# Patient Record
Sex: Male | Born: 1945
Health system: Southern US, Community
[De-identification: ages and names within clinical notes are randomized; demographics above are authoritative.]

## PROBLEM LIST (undated history)

## (undated) DIAGNOSIS — I4891 Unspecified atrial fibrillation: Secondary | ICD-10-CM

## (undated) DIAGNOSIS — I10 Essential (primary) hypertension: Secondary | ICD-10-CM

## (undated) DIAGNOSIS — Z923 Personal history of irradiation: Secondary | ICD-10-CM

## (undated) DIAGNOSIS — E119 Type 2 diabetes mellitus without complications: Secondary | ICD-10-CM

## (undated) DIAGNOSIS — C61 Malignant neoplasm of prostate: Secondary | ICD-10-CM

## (undated) DIAGNOSIS — J309 Allergic rhinitis, unspecified: Secondary | ICD-10-CM

## (undated) DIAGNOSIS — E785 Hyperlipidemia, unspecified: Secondary | ICD-10-CM

## (undated) HISTORY — DX: Hyperlipidemia, unspecified: E78.5

## (undated) HISTORY — DX: Malignant neoplasm of prostate: C61

## (undated) HISTORY — PX: KNEE SURGERY: SHX244

## (undated) HISTORY — DX: Essential (primary) hypertension: I10

## (undated) HISTORY — DX: Allergic rhinitis, unspecified: J30.9

## (undated) HISTORY — DX: Type 2 diabetes mellitus without complications: E11.9

## (undated) HISTORY — PX: SKIN GRAFT: SHX250

## (undated) HISTORY — PX: TONSILECTOMY/ADENOIDECTOMY WITH MYRINGOTOMY: SHX6125

---

## 2006-12-15 HISTORY — PX: PROSTATECTOMY: SHX69

## 2017-03-29 ENCOUNTER — Telehealth: Payer: Self-pay | Admitting: Cardiology

## 2017-03-29 NOTE — Telephone Encounter (Signed)
Closed Encounter  °

## 2017-04-30 ENCOUNTER — Ambulatory Visit: Payer: Self-pay | Admitting: Cardiology

## 2017-05-13 ENCOUNTER — Encounter: Payer: Self-pay | Admitting: Cardiology

## 2017-05-13 DIAGNOSIS — I480 Paroxysmal atrial fibrillation: Secondary | ICD-10-CM | POA: Insufficient documentation

## 2017-05-14 ENCOUNTER — Ambulatory Visit (INDEPENDENT_AMBULATORY_CARE_PROVIDER_SITE_OTHER): Payer: Medicare Other | Admitting: Cardiology

## 2017-05-14 ENCOUNTER — Encounter: Payer: Self-pay | Admitting: Cardiology

## 2017-05-14 VITALS — BP 120/66 | HR 93 | Ht 68.0 in | Wt 220.0 lb

## 2017-05-14 DIAGNOSIS — I4892 Unspecified atrial flutter: Secondary | ICD-10-CM | POA: Insufficient documentation

## 2017-05-14 DIAGNOSIS — I480 Paroxysmal atrial fibrillation: Secondary | ICD-10-CM

## 2017-05-14 DIAGNOSIS — I10 Essential (primary) hypertension: Secondary | ICD-10-CM | POA: Insufficient documentation

## 2017-05-14 DIAGNOSIS — Z7901 Long term (current) use of anticoagulants: Secondary | ICD-10-CM | POA: Diagnosis not present

## 2017-05-14 NOTE — Patient Instructions (Addendum)
Medication Instructions:  Your physician recommends that you continue on your current medications as directed. Please refer to the Current Medication list given to you today.   Labwork: None  Testing/Procedures: You had an EKG today.  Follow-Up: Your physician recommends that you schedule a follow-up appointment in: 3 months.   Any Other Special Instructions Will Be Listed Below (If Applicable).     If you need a refill on your cardiac medications before your next appointment, please call your pharmacy.    1. Avoid all over-the-counter antihistamines except Claritin/Loratadine and Zyrtec/Cetrizine. 2. Avoid all combination including cold sinus allergies flu decongestant and sleep medications 3. You can use Robitussin DM Mucinex and Mucinex DM for cough. 4. can use Tylenol aspirin ibuprofen and naproxen but no combinations such as sleep or sinus.  KardiaMobile [email or web address]        FDA-cleared, clinical grade mobile EKG monitor: Jodelle Red is the most clinically-validated mobile EKG used by the world's leading cardiac care medical professionals With Basic service, know instantly if your heart rhythm is normal or if atrial fibrillation is detected, and email the last single EKG recording to yourself or your doctor Premium service, available for purchase through the Kardia app for $9.99 per month or $99 per year, includes unlimited history and storage of your EKG recordings, a monthly EKG summary report to share with your doctor, along with the ability to track your blood pressure, activity and weight Includes one KardiaMobile phone clip FREE SHIPPING: Standard delivery 1-3 business days. Orders placed by 11:00am PST will ship that afternoon. Otherwise, will ship next business day. All orders ship via [employer] from [place], [place]

## 2017-05-14 NOTE — Progress Notes (Signed)
Cardiology Office Note:    Date:  05/14/2017   ID:  Edgar Herrera, DOB January 02, 1946, MRN [id number]  [facility]  Cardiologist:  Shirlee More, MD   Referring MD: No ref. provider found  ASSESSMENT:    1. Paroxysmal atrial fibrillation (HCC)   2. Chronic anticoagulation   3. Essential hypertension    PLAN:    In order of problems listed above   1. Clinically has done well he said no recurrence and at this time I would not place him on antiarrhythmic drug. He is records says that he had bradycardia and they did not use a beta blocker but he tells me his home heart rates run 60-90 bpm. I asked him to continue his anticoagulant to survey his heart rate and blood pressure with blood pressure monitor or to purchase the eye phone adapter record EKGs. If having frequent episodes of what started antiarrhythmic drug and low-dose beta blocker. In particular I would not referral for EP catheter ablation at this time. 2. Stable appropriate with his chads 2 score continue his anticoagulant 3. Stable blood pressure target continue his ACE inhibitor  Next appointment in 3 months   Medication Adjustments/Labs and Tests Ordered: Current medicines are reviewed at length with the patient today.  Concerns regarding medicines are outlined above.  Orders Placed This Encounter  Procedures  . EKG 12-Lead   No orders of the defined types were placed in this encounter.    Chief Complaint  Patient presents with  . Atrial Fibrillation    History of Present Illness:    Edgar Herrera is a 71 y.o. male who is being seen today for the evaluation of atrial  fibrillation / flutter at the patient  Request. Prior to the visit I reviewed his [place] hospital records including primary care cardiology consultation a recent cardiology note. November of last year he felt badly respiratory infection and was found to be in atrial fibrillation. He subsequently converted to sinus rhythm and since then he's had one  brief episode of rapid heart rhythm lasting a few minutes. He's been anticoagulated and has not been on any suppressive beta blocker or antiarrhythmic drug treatment. He remains active exercises on a regular basis and has had no angina dyspnea palpitation syncope or TIA. He's had no bleeding complication from his anticoagulant. Echocardiogram was reassuring and stress test was done several years ago that was normal. He seeks my opinion regarding further treatment with either an antiarrhythmic drug referral to EP for catheter ablation. He has no history of congenital rheumatic or thyroid disease   Past Medical History:  Diagnosis Date  . Allergic rhinitis   . Arrhythmia   . Hyperlipidemia   . Hypertension   . Prostate cancer [facility])     Past Surgical History:  Procedure Laterality Date  . KNEE SURGERY Bilateral   . PROSTATECTOMY    . SKIN GRAFT      Current Medications: Current Meds  Medication Sig  . allopurinol (ZYLOPRIM) 100 MG tablet Take 100 mg by mouth daily.  Marland Kitchen amLODipine (NORVASC) 10 MG tablet Take 10 mg by mouth daily.  Marland Kitchen apixaban (ELIQUIS) 5 MG TABS tablet Take 5 mg by mouth 2 (two) times daily.  . Artificial Tear Solution (SYSTANE CONTACTS) SOLN Apply 1 drop to eye daily.  . carbamide peroxide (DEBROX) 6.5 % OTIC solution 4 drops 4 (four) times daily.  . cyanocobalamin 500 MCG tablet Take 500 mcg by mouth 2 (two) times daily.  Marland Kitchen docusate  sodium (COLACE) 100 MG capsule Take 100 mg by mouth daily.  Marland Kitchen glipiZIDE (GLUCOTROL) 5 MG tablet Take 5 mg by mouth daily before breakfast. Take 1 tablet before breakfast and Take 1/5 tablet before dinner.  . hydrochlorothiazide (HYDRODIURIL) 25 MG tablet Take 12.5 mg by mouth daily.  Marland Kitchen lisinopril (PRINIVIL,ZESTRIL) 40 MG tablet Take 40 mg by mouth daily.  . metFORMIN (GLUCOPHAGE) 1000 MG tablet Take 1,000 mg by mouth 2 (two) times daily with a meal.  . Multiple Vitamins-Minerals (MULTIVITAMIN WITH MINERALS) tablet Take 1 tablet by mouth  daily.  . NON FORMULARY Place 0.5 application into both eyes at bedtime. Ocular Lubricant ointment 3.5 gm  . Omega-3 Fatty Acids (FISH OIL) 1000 MG CAPS Take 1,000 mg by mouth 2 (two) times daily.  . pravastatin (PRAVACHOL) 40 MG tablet Take 40 mg by mouth daily.     Allergies:   Patient has no known allergies.   Social History   Social History  . Marital status: Married    Spouse name: N/A  . Number of children: N/A  . Years of education: N/A   Social History Main Topics  . Smoking status: Never Smoker  . Smokeless tobacco: Never Used  . Alcohol use No  . Drug use: No  . Sexual activity: Not Asked   Other Topics Concern  . None   Social History Narrative  . None     Family History: The patient's family history includes Heart attack in his father; Hypertension in his brother, sister, and sister; Lung cancer in his mother.  ROS:   ROS Please see the history of present illness.     All other systems reviewed and are negative.  EKGs/Labs/Other Studies Reviewed:    The following studies [facility]  EKG:  EKG is  ordered today.  The ekg ordered today demonstrates [place], insignificant Q inferiorly, normal EKG. Echo 09/06/16, mild LVH, EF >55%, mild MR and TR Recent Labs: 03/22/13 CBC and CMP normal No results found for requested labs within last 8760 hours.  Recent Lipid Panel 03/22/17 Chol 144, HDL 43, LDL 80 No results found for: CHOL, TRIG, HDL, CHOLHDL, VLDL, LDLCALC, LDLDIRECT  Physical Exam:    VS:  BP 120/66   Pulse 93   Ht 5\' 8"  (1.727 m)   Wt 220 lb (99.8 kg)   SpO2 96%   BMI 33.45 kg/m     Wt Readings from Last 3 Encounters:  05/14/17 220 lb (99.8 kg)     GEN:  Well nourished, well developed in no acute distress HEENT: Normal NECK: No JVD; No carotid bruits LYMPHATICS: No lymphadenopathy CARDIAC: RRR, no murmurs, rubs, gallops RESPIRATORY:  Clear to auscultation without rales, wheezing or rhonchi    ABDOMEN: Soft, non-tender, non-distended MUSCULOSKELETAL:  No edema; No deformity  SKIN: Warm and dry NEUROLOGIC:  Alert and oriented x 3 PSYCHIATRIC:  Normal affect     Signed, Shirlee More, MD  05/14/2017 2:16 PM    [facility]

## 2017-06-07 ENCOUNTER — Ambulatory Visit: Payer: Medicare Other | Admitting: Sports Medicine

## 2017-09-13 ENCOUNTER — Ambulatory Visit: Payer: Medicare Other | Admitting: Cardiology

## 2017-12-21 ENCOUNTER — Telehealth: Payer: Self-pay | Admitting: *Deleted

## 2017-12-21 ENCOUNTER — Encounter: Payer: Self-pay | Admitting: Radiation Oncology

## 2017-12-22 NOTE — Telephone Encounter (Signed)
Oncology Nurse Navigator Documentation  Placed introductory call to new referral patient Edgar Herrera.  Introduced myself as the H&N oncology nurse navigator that works with Dr. Isidore Moos to whom he has been referred by Dr. Silvestre Moment.     He confirmed understanding of referral and appt date/time of 3/22 7:30 NE/8:00 consult Dr. Isidore Moos.  I briefly explained my role as his navigator, indicated I would be joining him during his appt next week.  I confirmed his understanding of [place] location, explained arrival and RadOnc registration process for appt.  I provided my contact information, encouraged him to call with questions/concerns before next week.  He reported:  Has copy of 2/21 CT Neck, will bring to consult.  Has PET scheduled 3/18 in [place].  He will bring copy to consult.  Has appt with Dr. Conley Canal, [facility], 3/20.  I encouraged him to take copy of PET and CT Neck.  I encouraged him to call me with questions/concerns prior to next week's appt.  Gayleen Orem, RN, BSN Head & Neck Oncology Nurse [place] at [place] [phone]

## 2017-12-22 NOTE — Telephone Encounter (Signed)
Oncology Nurse Navigator Documentation  Spoke w/ Chiquita Loth, [place] [phone], x [phone]) re patient's biopsy.  She indicated bx taken yesterday, 3/14, report pending.  I requested notification when available including fax of copy to [place], she agreed.  I provided my phone #, [place] fax #.  Gayleen Orem, RN, BSN Head & Neck Oncology Nurse [place] at [place] [phone]

## 2017-12-25 ENCOUNTER — Ambulatory Visit
Admission: RE | Admit: 2017-12-25 | Discharge: 2017-12-25 | Disposition: A | Discharge status: Home / Self Care | Payer: Self-pay | Source: Ambulatory Visit | Attending: Radiation Oncology | Admitting: Radiation Oncology

## 2017-12-25 ENCOUNTER — Other Ambulatory Visit: Payer: Self-pay | Admitting: Radiation Oncology

## 2017-12-25 DIAGNOSIS — C029 Malignant neoplasm of tongue, unspecified: Secondary | ICD-10-CM

## 2017-12-26 NOTE — Progress Notes (Signed)
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   Patient presented with symptoms of: He presented to Dr. Silvestre Moment ([facility]) on 12/10/17 for urgent appointment after recent ER visit. He had an episode of spitting up blood which led to the ER visit. He did report mild dysphagia for about 2 weeks.    Biopsies revealed:  Specimen received 12/21/17 from Silvestre Moment MD revealed: Base of tongue mass, biopsy: Invasive poorly differentiated squamous cell carcinoma.  Comment: The biopsy material shows invasive poorly differentiated non-small cell carcinoma. The tumor is diffusely positive for p 40 and p16.   Nutrition Status Yes No Comments  Weight changes? [x]  []  He reports losing about 8 lbs over the past few months.   Swallowing concerns? [x]  []  He is eating softer foods. He reports some foods and pills are difficult to get past his throat  PEG? []  [x]     Referrals Yes No Comments  Social Work? []  [x]    Dentistry? []  [x]    Swallowing therapy? []  [x]    Nutrition? []  [x]    Med/Onc? [x]  []  Dr. Jimmye Norman at the [facility] Yes No Comments  Prior radiation? []  [x]    Pacemaker/ICD? []  [x]    Possible current pregnancy? []  [x]    Is the patient on methotrexate? []  [x]     Tobacco/Marijuana/Snuff/ETOH use: He has never smoked and does not drink alcohol   Past/Anticipated interventions by otolaryngology, if any:  Sera Edison Nasuti performed trans-nasal fiberoptic laryngoscopy 12/17/17  Past/Anticipated interventions by medical oncology, if any:  None scheduled at this time.    Current Complaints / other details:   11/29/17 CT soft tissue neck  Dr. Conley Canal at [facility] on 12/26/17 ? TORS procedure. He does not qualify. They saw Dr. Jimmye Norman, medical oncologist at [facility]. Per his wife's report they will be referred here to see a medical oncologist.   BP 123/73   Pulse 74   Temp 98.5 F (36.9 C)   Ht 5\' 8"  (1.727 m)   Wt  213 lb (96.6 kg)   SpO2 95% Comment: room air  BMI 32.39 kg/m    Wt Readings from Last 3 Encounters:  12/28/17 213 lb (96.6 kg)  05/14/17 220 lb (99.8 kg)

## 2017-12-27 ENCOUNTER — Telehealth: Payer: Self-pay | Admitting: *Deleted

## 2017-12-27 NOTE — Telephone Encounter (Signed)
Oncology Nurse Navigator Documentation  [facility] [place] re pt's dental hx, requested call-back.  Gayleen Orem, RN, BSN Head & Neck Oncology Nurse [place] at [place] [phone]

## 2017-12-28 ENCOUNTER — Encounter: Payer: Self-pay | Admitting: *Deleted

## 2017-12-28 ENCOUNTER — Encounter: Payer: Self-pay | Admitting: Radiation Oncology

## 2017-12-28 ENCOUNTER — Ambulatory Visit
Admission: RE | Admit: 2017-12-28 | Discharge: 2017-12-28 | Disposition: A | Discharge status: Home / Self Care | Payer: No Typology Code available for payment source | Source: Ambulatory Visit | Attending: Radiation Oncology | Admitting: Radiation Oncology

## 2017-12-28 ENCOUNTER — Other Ambulatory Visit: Payer: Self-pay

## 2017-12-28 ENCOUNTER — Telehealth: Payer: Self-pay | Admitting: *Deleted

## 2017-12-28 VITALS — BP 123/73 | HR 74 | Temp 98.5°F | Ht 68.0 in | Wt 213.0 lb

## 2017-12-28 DIAGNOSIS — Z7401 Bed confinement status: Secondary | ICD-10-CM | POA: Insufficient documentation

## 2017-12-28 DIAGNOSIS — Z7901 Long term (current) use of anticoagulants: Secondary | ICD-10-CM | POA: Insufficient documentation

## 2017-12-28 DIAGNOSIS — E785 Hyperlipidemia, unspecified: Secondary | ICD-10-CM | POA: Insufficient documentation

## 2017-12-28 DIAGNOSIS — I4891 Unspecified atrial fibrillation: Secondary | ICD-10-CM | POA: Diagnosis not present

## 2017-12-28 DIAGNOSIS — C01 Malignant neoplasm of base of tongue: Secondary | ICD-10-CM | POA: Diagnosis present

## 2017-12-28 DIAGNOSIS — I10 Essential (primary) hypertension: Secondary | ICD-10-CM | POA: Insufficient documentation

## 2017-12-28 DIAGNOSIS — Z7984 Long term (current) use of oral hypoglycemic drugs: Secondary | ICD-10-CM | POA: Diagnosis not present

## 2017-12-28 DIAGNOSIS — Z79899 Other long term (current) drug therapy: Secondary | ICD-10-CM | POA: Insufficient documentation

## 2017-12-28 DIAGNOSIS — Z8546 Personal history of malignant neoplasm of prostate: Secondary | ICD-10-CM | POA: Diagnosis not present

## 2017-12-28 HISTORY — DX: Unspecified atrial fibrillation: I48.91

## 2017-12-28 MED ORDER — LARYNGOSCOPY SOLUTION RAD-ONC
15.0000 mL | Freq: Once | TOPICAL | Status: AC
  Administered 2017-12-28: 15 mL via TOPICAL
  Filled 2017-12-28: qty 15

## 2017-12-28 NOTE — Progress Notes (Signed)
Radiation Oncology         [phone] ________________________________  Initial Outpatient Consultation  Name: Edgar Herrera MRN: [id number]  Date: 12/28/2017  DOB: 30-Jul-1946  [facility]  Berneice Heinrich, MD   REFERRING PHYSICIAN: Berneice Heinrich, MD  DIAGNOSIS:    ICD-10-CM   1. 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 male who presented to an emergency department in [place] on 11/29/2017 following an episode of hemoptysis and a 2-week history of intermittent mild dysphagia. He was evaluated with a CT scan of the neck that showed a large exophytic mass extending from the hyoid Ege medially toward the midline and laterally up toward the glossotonsillar sulcus.  Subsequently, the patient saw Dr. Silvestre Moment for urgent consultation at [facility] on 12/10/2017. A trans-nasal fiberoptic laryngoscopy was performed on 12/17/2017.  Biopsy of tongue base mass on 12/17/2017 revealed: Invasive poorly differentiated squamous cell carcinoma. The biopsy material showed invasive poorly differentiated non-small cell carcinoma. The tumor is diffusely positive for p40 and p16.   Pertinent imaging thus far includes PET - I've reviewed those images but there is not an available report - notable for left BOT large mass, ipsilateral adenopathy, but revealing no evidence of involvement outside the head and neck.     He was seen in medical oncology by Dr. Jimmye Norman at the [facility]. He was also seen by Dr. Conley Canal at [facility] on 12/26/2017 for  consideration of the TORS procedure. The patient is not a TORS candidate according to Dr. Conley Canal. He staged pt's cancer clinically at [facility], though that may be by AJCC 7th edition.  He has been referred today for discussion of potential radiation treatment options. He is accompanied by his wife. We are also joined in consultation by Gayleen Orem, RN, our Head and Neck Navigator. He reports he would like to be referred to see a medical oncologist here at [facility].  Swallowing issues, if any: He reports some foods and pills are difficult to get past his throat. He is eating softer foods.  Weight Changes: He reports an 8 pound weight loss over the past few months.  Pain status: He denies.   Tobacco history, if any: He denies.  ETOH abuse, if any: He denies.  Prior cancers, if any: Skin cancers that have been "burnt off or shaved off"  On review of systems, the patient reports diabetes.  PREVIOUS RADIATION THERAPY: No  PAST MEDICAL HISTORY:  has a past medical history of Allergic rhinitis, Atrial fibrillation ([place]), Hyperlipidemia, Hypertension, and Prostate cancer ([place]).    PAST SURGICAL HISTORY: Past Surgical History:  Procedure Laterality Date  . KNEE SURGERY Bilateral    2007 and 1998,   . PROSTATECTOMY  12/15/2006  . SKIN GRAFT     as a child left elbow  . TONSILECTOMY/ADENOIDECTOMY WITH MYRINGOTOMY     as a child    FAMILY HISTORY: family history includes Heart attack in his father; Hypertension in his brother, sister, and sister; Lung cancer  in his mother.  SOCIAL HISTORY:  reports that he has never smoked. He has never used smokeless tobacco. He reports that he drinks alcohol. He reports that he does not use drugs.  ALLERGIES: Patient has no known allergies.  MEDICATIONS:  Current Outpatient Medications  Medication Sig Dispense Refill  . allopurinol (ZYLOPRIM) 100 MG tablet Take 100 mg by mouth daily.    Marland Kitchen amLODipine (NORVASC) 10 MG tablet Take 10 mg  by mouth daily.    Marland Kitchen apixaban (ELIQUIS) 5 MG TABS tablet Take 5 mg by mouth 2 (two) times daily.    . Artificial Tear Solution (SYSTANE CONTACTS) SOLN Apply 1 drop to eye daily.    . carbamide peroxide (DEBROX) 6.5 % OTIC solution 4 drops 4 (four) times daily.    . cyanocobalamin 500 MCG tablet Take 500 mcg by mouth 2 (two) times daily.    Marland Kitchen glipiZIDE (GLUCOTROL) 5 MG tablet Take 5 mg by mouth daily before breakfast. Take 1 tablet before breakfast and Take 1/5 tablet before dinner.    . hydrochlorothiazide (HYDRODIURIL) 25 MG tablet Take 12.5 mg by mouth daily.    Marland Kitchen lisinopril (PRINIVIL,ZESTRIL) 40 MG tablet Take 40 mg by mouth daily.    . metFORMIN (GLUCOPHAGE) 1000 MG tablet Take 1,000 mg by mouth 2 (two) times daily with a meal.    . NON FORMULARY Place 0.5 application into both eyes at bedtime. Ocular Lubricant ointment 3.5 gm    . Omega-3 Fatty Acids (FISH OIL) 1000 MG CAPS Take 1,000 mg by mouth 2 (two) times daily.    . pravastatin (PRAVACHOL) 40 MG tablet Take 40 mg by mouth daily.    . Multiple Vitamins-Minerals (MULTIVITAMIN WITH MINERALS) tablet Take 1 tablet by mouth daily.     Current Facility-Administered Medications  Medication Dose Route Frequency Provider Last Rate Last Dose  . laryngocopy solution for Rad-Onc  15 mL Topical Once [name], MD        REVIEW OF SYSTEMS:  A 10+ POINT REVIEW OF SYSTEMS WAS OBTAINED including neurology, dermatology, psychiatry, cardiac, respiratory, lymph, extremities, GI, GU, Musculoskeletal, constitutional, breasts, reproductive, HEENT.  All pertinent positives are noted in the HPI.  All others are negative.   PHYSICAL EXAM:  height is 5\' 8"  (1.727 m) and weight is 213 lb (96.6 kg). His temperature is 98.5 F (36.9 C). His blood pressure is 123/73 and his pulse is 74. His oxygen saturation is 95%.   General: Alert and oriented, in no acute distress. HEENT: Head is normocephalic. Extraocular movements are intact. He has metal on his teeth.  He is missing some of his teeth. I was not able to appreciate a tumor in the mouth or upper oropharnx. Neck: Neck is notable for two palpable masses that extend from the angle of left mandible to the submandibular region. Each mass is approximately 1.5 to 2 cm in size. Heart: Regular in rate and rhythm with no murmurs, rubs, or gallops. Chest: Clear to auscultation bilaterally, with no rhonchi, wheezes, or rales. Abdomen: Soft, nontender, nondistended, with no rigidity or guarding. Extremities: He has some pitting edema in his ankles bilaterally. Lymphatics: see Neck Exam Skin: Evidence of prior sun damage. Musculoskeletal: Symmetric strength and muscle tone throughout. Neurologic: Cranial nerves II through XII are grossly intact. No obvious focalities. Speech is fluent. Coordination is intact. Finger to nose testing intact. Psychiatric: Judgment and insight are intact. Affect is appropriate.  PROCEDURE NOTE: After obtaining consent and anesthetizing the nasal cavity with topical lidocaine and  phenylephrine, the flexible endoscope was introduced and passed through the nasal cavity.  He has a multilobular left base of tongue mass which appears to be originating from the left and involving the left lateral pharyngeal wall. It crosses midline and touches, but does not appear to be attached to, the right lateral pharyngeal wall. Due to the bulk of the mass, I am not able to pass the scope to visualize his larynx.    ECOG = 1  0 - Asymptomatic (Fully active, able to carry on all predisease activities without restriction)  1 - Symptomatic but completely ambulatory (Restricted in physically strenuous activity but ambulatory and able to carry out work of a light or sedentary nature. For example, light housework, office work)  2 - Symptomatic, <50% in bed during the day (Ambulatory and capable of all self care but unable to carry out any work activities. Up and about more than 50% of waking hours)  3 -  Symptomatic, >50% in bed, but not bedbound (Capable of only limited self-care, confined to bed or chair 50% or more of waking hours)  4 - Bedbound (Completely disabled. Cannot carry on any self-care. Totally confined to bed or chair)  5 - Death   Eustace Pen MM, Creech RH, Tormey DC, et al. [phone]). "Toxicity and response criteria of the [facility] Group". [place] Oncol. 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 I do recommend radiotherapy for this patient. Anticipate 7 weeks of RT to throat/ b/l neck and concurrent systemic therapy.  We discussed the potential risks, benefits, and side effects of radiotherapy. We talked in detail about acute and late effects. We discussed that some of the most bothersome acute effects may be mucositis, dysgeusia, salivary changes, skin irritation, hair loss, dehydration, weight loss and fatigue. We talked about late effects which include but are not necessarily limited to dysphagia, hypothyroidism, nerve injury, spinal cord injury, xerostomia, trismus, and neck edema. No guarantees of treatment were given. A consent form was signed and placed in the patient's medical record. The patient is enthusiastic about proceeding with treatment. I look forward to participating in the patient's care.    Simulation (treatment planning) will take place after clearance from dental medicine  We also discussed that the treatment of head and neck cancer is a multidisciplinary process to maximize treatment outcomes and quality of life. For this reasons the following  referrals have been or will be made:  1. Medical oncology to discuss chemotherapy. He will be referred to see a medical oncologist here at [facility].  2. Dentistry for dental evaluation, possible extractions in the radiation fields, scatter guards, and /or advice on reducing risk of cavities, osteoradionecrosis, or other oral issues.  3. Nutritionist for nutrition support during and after treatment.  4. Speech language pathology for swallowing and/or speech therapy.  5. Social work for social support.   6. Physical therapy due to risk of lymphedema in neck and deconditioning.  7. Baseline labs    __________________________________________   [name], MD  This  document serves as a record of services personally performed by [name], MD. It was created on her behalf by Rae Lips, a trained medical scribe. The creation of this record is based on the scribe's personal observations and the provider's statements to them. This document has been checked and approved by the attending provider.

## 2017-12-29 NOTE — Progress Notes (Signed)
Oncology Nurse Navigator Documentation Oncology Nurse Navigator Documentation  Met with patient during initial consult with Dr. Isidore Moos.  He was accompanied by his wife.  1. Further introduced myself as their Navigator, explained my role as a member of the Care Team.   2. Provided New Patient Information packet, discussed contents:  Contact information for physician(s), myself, other members of the Care Team.  Advance Directive information ([place] blue pamphlet with LCSW contact info), provided [facility] AD booklet.  Fall Prevention Patient Safety Plan  Appointment Guideline  Financial Assistance Information sheet  [place] with highlight of [place]  SLP information sheet 3. Provided introductory explanation of radiation treatment including SIM planning and purpose of Aquaplast head and shoulder mask, showed them example.   4. Provided and discussed education handouts for PEG and PAC.  5. Discussed the need for pre-radiotherapy dental evaluation.  Wife noted [facility] covers dental services only for vets considered 100% disabled; he is deemed 70% disabled. 6. We discussed his attendance at the 4/9 [facility]. 7. Of Note:  Met with Dr. Conley Canal, [facility] Wed.  He indicated surgery not a viable tmt option.  Had appt with [facility] yesterday, was told he can have chemo at [facility], pre-authorization pending.  They understand I will coordinate ASAP [facility] MedOnc consult appt.  [facility] does not authorize services in [place] where they live.  They voiced comfort/acceptance with 35-45 minute travel to [facility]. 8.  They voiced understanding I will contact [facility] re pre-authorization for PT, dental, PAC/PEG and check on chemo pre-auth. 9.  I encouraged them to call me with questions/concerns as procedures/treatments proceed.  They verbalized understanding of information provided.    Gayleen Orem, RN, BSN Head & Neck Oncology Nurse [place] at [place] [phone]

## 2017-12-29 NOTE — Telephone Encounter (Addendum)
Oncology Nurse Navigator Documentation  Returned call to [place].  She informed pre-authorization for PEG has been completed, to be faxed this afternoon.  I provided RadOnc fax #.  I informed her pt met with Dr. Isidore Moos, RadOnc, this morning.  Per this appt, pre-authorization needed for:  PAC, PT lymphedema, pre-radiotherapy dental.  She noted dental likely not to be covered but she will request.    I shared pt's indication chemotherapy will be pre-authorized for [facility].  She noted not issued yet but will check on status.  She requested faxed request for above noted services, provided fax [phone].  I relayed request to to [place].  Gayleen Orem, RN, BSN Head & Neck Oncology Nurse [place] at [place] [phone]

## 2017-12-31 ENCOUNTER — Other Ambulatory Visit: Payer: Self-pay | Admitting: Radiation Oncology

## 2017-12-31 DIAGNOSIS — C01 Malignant neoplasm of base of tongue: Secondary | ICD-10-CM

## 2018-01-01 ENCOUNTER — Telehealth: Payer: Self-pay

## 2018-01-01 ENCOUNTER — Encounter: Payer: Self-pay | Admitting: Radiation Oncology

## 2018-01-01 ENCOUNTER — Telehealth: Payer: Self-pay | Admitting: Radiation Oncology

## 2018-01-01 ENCOUNTER — Encounter (HOSPITAL_COMMUNITY): Payer: Self-pay | Admitting: Dentistry

## 2018-01-01 ENCOUNTER — Ambulatory Visit (HOSPITAL_COMMUNITY): Payer: Self-pay | Admitting: Dentistry

## 2018-01-01 ENCOUNTER — Other Ambulatory Visit (HOSPITAL_COMMUNITY): Payer: No Typology Code available for payment source | Admitting: Dentistry

## 2018-01-01 VITALS — BP 138/66 | HR 69 | Temp 98.4°F

## 2018-01-01 DIAGNOSIS — K0530 Chronic periodontitis, unspecified: Secondary | ICD-10-CM

## 2018-01-01 DIAGNOSIS — C01 Malignant neoplasm of base of tongue: Secondary | ICD-10-CM

## 2018-01-01 DIAGNOSIS — M264 Malocclusion, unspecified: Secondary | ICD-10-CM

## 2018-01-01 DIAGNOSIS — K036 Deposits [accretions] on teeth: Secondary | ICD-10-CM

## 2018-01-01 DIAGNOSIS — Z01818 Encounter for other preprocedural examination: Secondary | ICD-10-CM

## 2018-01-01 DIAGNOSIS — K030 Excessive attrition of teeth: Secondary | ICD-10-CM

## 2018-01-01 DIAGNOSIS — Z9189 Other specified personal risk factors, not elsewhere classified: Secondary | ICD-10-CM

## 2018-01-01 DIAGNOSIS — K06010 Localized gingival recession, unspecified: Secondary | ICD-10-CM

## 2018-01-01 DIAGNOSIS — J3489 Other specified disorders of nose and nasal sinuses: Secondary | ICD-10-CM

## 2018-01-01 DIAGNOSIS — K08409 Partial loss of teeth, unspecified cause, unspecified class: Secondary | ICD-10-CM

## 2018-01-01 MED ORDER — SODIUM FLUORIDE 1.1 % DT GEL: DENTAL | 99 refills | Status: AC

## 2018-01-01 MED FILL — FLUORISHIELD 1.1% GEL: 1.1 % | 30 days supply | Qty: 114 | Fill #0

## 2018-01-01 NOTE — Telephone Encounter (Signed)
Edgar Herrera is scheduled for CT simulation with IV contrast tomorrow. I have asked him to come at 11:45 am to have labs drawn before the CT simulation tomorrow. I have also instructed him not to take his metformin starting tomorrow 3/27 and wait to begin taking it again until after post-simulation labs have been drawn on Friday 3/29 and he has received a phone call from this office saying it is ok to take his metformin. He voiced his understanding, and knows to call me if he has any further questions or concerns.

## 2018-01-01 NOTE — Progress Notes (Signed)
Has armband been applied? Yes  Does patient have an allergy to IV contrast dye?: No   Has patient ever received premedication for IV contrast dye?: N/A  Does patient take metformin?: Yes  If patient does take metformin when was the last dose: 01/01/18   Date of lab work: 01/02/18 BUN: 19 CR: 0.97 EGFR: >60  IV site: Right AC  Has IV site been added to flowsheet?  Yes  BP (!) 120/58 (BP Location: Right Arm)   Pulse 66   Temp 98.2 F (36.8 C)   Ht 5' 8"  (1.727 m)   Wt 214 lb 6.4 oz (97.3 kg)   SpO2 98% Comment: room air  BMI 32.60 kg/m

## 2018-01-01 NOTE — Patient Instructions (Signed)
RADIATION THERAPY AND DECISIONS REGARDING YOUR TEETH  Xerostomia (dry mouth) Your salivary glands may be in the filed of radiation.  Radiation may include all or part of your saliva glands.  This will cause your saliva to dry up and you will have a dry mouth.  The dry mouth will be for the rest of your life unless your radiation oncologist tells you otherwise.  Your saliva has many functions:  Saliva wets your tongue for speaking.  It coats your teeth and the inside of your mouth for easier movement.  It helps with chewing and swallowing food.  It helps clean away harmful acid and toxic products made by the germs in your mouth, therefore it helps prevent cavities.  It kills some germs in your mouth and helps to prevent gum disease.  It helps to carry flavor to your taste buds.  Once you have lost your saliva you will be at higher risk for tooth decay and gum disease.  What can be done to help improve your mouth when there's not enough saliva:  1.  Your dentist may give a prescription for Salagen.  It will not bring back all of your saliva but may bring back some of it.  Also your saliva may be thick and ropy or white and foamy. It will not feel like it use to feel.  2.  You will need to swish with water every time your mouth feels dry.  YOU CANNOT suck on any cough drops, mints, lemon drops, candy, vitamin C or any other products.  You cannot use anything other than water to make your mouth feel less dry.  If you want to drink anything else you have to drink it all at once and brush afterwards.  Be sure to discuss the details of your diet habits with your dentist or hygienist.  Radiation caries: This is decay that happens very quickly once your mouth is very dry due to radiation therapy.  Normally cavities take six months to two years to become a problem.  When you have dry mouth cavities may take as little as eight weeks to cause you a problem.  This is why dental check ups every two  months are necessary as long as you have a dry mouth. Radiation caries typically, but not always, start at your gum line where it is hard to see the cavity.  It is therefore also hard to fill these cavities adequately.  This high rate of cavities happens because your mouth no longer has saliva and therefore the acid made by the germs starts the decay process.  Whenever you eat anything the germs in your mouth change the food into acid.  The acid then burns a small hole in your tooth.  This small hole is the beginning of a cavity.  If this is not treated then it will grow bigger and become a cavity.  The way to avoid this hole getting bigger is to use fluoride every evening as prescribed by your dentist.  You have to make sure that your teeth are very clean before you use the fluoride.  This fluoride in turn will strengthen your teeth and prepare them for another day of fighting acid.  If you develop radiation caries many times the damage is so large that you will have to have all your teeth removed.  This could be a big problem if some of these teeth are in the field of radiation.  Further details of why this could be   a big problem will follow.  (See Osteoradionecrosis).  Loss of taste (dysgeusia) This happens to varying degrees once you've had radiation therapy to your jaw region.  Many times taste is not completely lost but becomes limited.  The loss of taste is mostly due to radiation affecting your taste buds.  However if you have no saliva in your mouth to carry the flavor to your taste buds it would be difficult for your taste buds to taste anything.  That is why using water or a prescription for Salagen prior to meals and during meals may help with some of the taste.  Keep in mind that taste generally returns very slowly over the course of several months or several years after radiation therapy.  Don't give up hope.  Trismus According to your Radiation Oncologist your TMJ or jaw joints are going to be  partially or fully in the field of radiation.  This means that over time the muscles that help you open and close your mouth may get stiff.  This will potentially result in your not being able to open your mouth wide enough or as wide as you can open it now.  Le me give you an example of how slowly this happens and how unaware people are of it.  A gentlemen that had radiation therapy two years ago came back to me complaining that bananas are just too large for him to be able to fit them in between his teeth.  He was not able to open wide enough to bite into a banana.  This happens slowly and over a period of time.  What do we do to try and prevent this?  Your dentist will probably give you a stack of sticks called a trismus exercise device .  This stack will help your remind your muscles and your jaw joint to open up to the same distance every day.  Use these sticks every morning when you wake up according to the instructions given by the dentist.   You must use these sticks for at least one to two years after radiation therapy.  The reason for that is because it happens so slowly and keeps going on for about two years after radiation therapy.  Your hospital dentist will help you monitor your mouth opening and make sure that it's not getting smaller.  Osteoradionecrosis (ORN) This is a condition where your jaw Minish after having had radiation therapy becomes very dry.  It has very little blood supply to keep it alive.  If you develop a cavity that turns into an abscess or an infection then the jaw Moilanen does not have enough blood supply to help fight the infection.  At this point it is very likely that the infection could cause the death of your jaw Quaranta.  When you have dead [place]hausen it has to be removed.  Therefore you might end up having to have surgery to remove part of your jaw Hellenbrand, the part of the jaw Dorow that has been affected.   Healing is also a problem if you are to have surgery in the areas where the Kroh  has had radiation therapy.  The same reasons apply.  If you have surgery you need more blood supply which is not available.  When blood supply and oxygen are not available again, there is a chance for the Bittel to die.  Occasionally ORN happens on its own with no obvious reason.  This is quite rare.  We believe that   patients who continue to smoke and/or drink alcohol have a higher chance of having this Marcello problem.  Therefore once your jaw Cretella has had radiation therapy if there are any teeth in that area, you should never have them pulled.  You should also never have any surgery on your teeth or gums in that area unless the oral surgeon or Periodontist is aware of your history of radiation. There is some expensive management techniques that might be used to limit your risks.  The risks for ORN either from infection or spontaneous ( or on it's own) are life long.    TRISMUS  Trismus is a condition where the jaw does not allow the mouth to open as wide as it usually does.  This can happen almost suddenly, or in other cases the process is so slow, it is hard to notice it-until it is too far along.  When the jaw joints and/or muscles have been exposed to radiation treatments, the onset of Trismus is very slow.  This is because the muscles are losing their stretching ability over a long period of time, as long as 2 YEARS after the end of radiation.  It is therefore important to exercise these muscles and joints.  TRISMUS EXERCISES   Stack of tongue depressors measuring the same or a little less than the last documented MIO (Maximum Interincisal Opening).  Secure them with a rubber band on both ends.  Place the stack in the patient's mouth, supporting the other end.  Allow 30 seconds for muscle stretching.  Rest for a few seconds.  Repeat 3-5 times  For all radiation patients, this exercise is recommended in the mornings and evenings unless otherwise instructed.  The exercise should be done for  a period of 2 YEARS after the end of radiation.  MIO should be checked routinely on recall dental visits by the general dentist or the hospital dentist.  The patient is advised to report any changes, soreness, or difficulties encountered when doing the exercises.  FLUORIDE TRAYS PATIENT INSTRUCTIONS    Obtain prescription from the pharmacy.  Don't be surprised if it needs to be ordered.  Be sure to let the pharmacy know when you are close to needing a new refill for them to have it ready for you without interruption of Fluoride use.  The best time to use your Fluoride is before bed time.  You must brush your teeth very well and floss before using the Fluoride in order to get the best use out of the Fluoride treatments.  Place 1 drop of Fluoride gel per tooth in the tray.  Place the tray on your lower teeth and your upper teeth.  Make sure the trays are seated all the way.  Remember, they only fit one way on your teeth.  Insert for 5 full minutes.  At the end of the 5 minutes, take the trays out.  SPIT OUT excess. .  Do NOT rinse your mouth!  Do NOT eat or drink after treatments for at least 30 minutes.  This is why the best time for your treatments is before bedtime.  Clean the inside of your Fluoride trays using COLD WATER and a toothbrush.  In order to keep your Trays from discoloring and free from odors, soak them overnight in denture cleaners such as Efferdent.  Do not use bleach or non denture products.  Store the trays in a safe dry place AWAY from any heat until your next treatment.  If anything happens   to your Fluoride trays, or they don't fit as well after any dental work, please let us know as soon as possible. 

## 2018-01-01 NOTE — Progress Notes (Signed)
DENTAL CONSULTATION  Date of Consultation:  01/01/2018 Patient Name:   Edgar Herrera Date of Birth:   07-30-1946 Medical Record Number: [id number]  VITALS: BP 138/66   Pulse 69   Temp 98.4 F (36.9 C)   CHIEF COMPLAINT: Patient referred by Dr. Isidore Moos for a dental consultation.  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 Patient with anticipated chemoradiation therapy. Patient is now seen as part of a medically necessary pre-chemoradiation therapy dental protocol examination.  The patient currently denies acute toothaches, swellings, or abscesses. Patient was last seen by his dentist in December 2018 for an exam and cleaning. Patient sees Dr. Jeralene Huff as his primary dentist. This is in [place], [place]. Patient is usually seen on an every 6 month basis. The patient has an existing maxillary cast partial denture. Patient has no lower partial denture. Patient denies having dental phobia. Patient denies having any unmet dental needs but is considering having a new maxillary cast partial denture fabricated in the future.    PROBLEM LIST: Patient Active Problem List   Diagnosis Date Noted  . Squamous cell carcinoma of base of tongue (HCC) 01/01/2018    Priority: High  . Atrial flutter ([place]) 05/14/2017  . Chronic anticoagulation 05/14/2017  . Essential hypertension 05/14/2017  . Paroxysmal atrial fibrillation ([place]) 05/13/2017    PMH: Past Medical History:  Diagnosis Date  . Allergic rhinitis   . Atrial fibrillation ([place])   . Hyperlipidemia   . Hypertension   . Prostate cancer ([place])     PSH: Past Surgical History:  Procedure Laterality Date  . KNEE SURGERY Bilateral    2007 and 1998,   . PROSTATECTOMY  12/15/2006  . SKIN GRAFT     as a child left elbow  . TONSILECTOMY/ADENOIDECTOMY WITH MYRINGOTOMY     as a child    ALLERGIES: No Known Allergies  MEDICATIONS: Current Outpatient Medications   Medication Sig Dispense Refill  . allopurinol (ZYLOPRIM) 100 MG tablet Take 100 mg by mouth daily.    Marland Kitchen amLODipine (NORVASC) 10 MG tablet Take 10 mg by mouth daily.    Marland Kitchen apixaban (ELIQUIS) 5 MG TABS tablet Take 5 mg by mouth 2 (two) times daily.    . Artificial Tear Solution (SYSTANE CONTACTS) SOLN Apply 1 drop to eye daily.    . carbamide peroxide (DEBROX) 6.5 % OTIC solution 4 drops 4 (four) times daily.    . cyanocobalamin 500 MCG tablet Take 500 mcg by mouth 2 (two) times daily.    Marland Kitchen glipiZIDE (GLUCOTROL) 5 MG tablet Take 5 mg by mouth daily before breakfast. Take 1 tablet before breakfast and Take 1/5 tablet before dinner.    . hydrochlorothiazide (HYDRODIURIL) 25 MG tablet Take 12.5 mg by mouth daily.    Marland Kitchen lisinopril (PRINIVIL,ZESTRIL) 40 MG tablet Take 40 mg by mouth daily.    . metFORMIN (GLUCOPHAGE) 1000 MG tablet Take 1,000 mg by mouth 2 (two) times daily with a meal.    . NON FORMULARY Place 0.5 application into both eyes at bedtime. Ocular Lubricant ointment 3.5 gm    . Omega-3 Fatty Acids (FISH OIL) 1000 MG CAPS Take 1,000 mg by mouth 2 (two) times daily.    . pravastatin (PRAVACHOL) 40 MG tablet Take 40 mg by mouth daily.    . Multiple Vitamins-Minerals (MULTIVITAMIN WITH MINERALS) tablet Take 1 tablet by mouth daily.     No current facility-administered medications for this visit.  LABS: No results found for: WBC, HGB, HCT, MCV, PLT No results found for: NA, K, CL, CO2, GLUCOSE, BUN, CREATININE, CALCIUM, GFRNONAA, GFRAA No results found for: INR, PROTIME No results found for: PTT  SOCIAL HISTORY: Social History   Socioeconomic History  . Marital status: Married    Spouse name: Not on file  . Number of children: Not on file  . Years of education: Not on file  . Highest education level: Not on file  Occupational History  . Not on file  Social Needs  . Financial resource strain: Not on file  . Food insecurity:    Worry: Not on file    Inability: Not on file   . Transportation needs:    Medical: Not on file    Non-medical: Not on file  Tobacco Use  . Smoking status: Never Smoker  . Smokeless tobacco: Never Used  Substance and Sexual Activity  . Alcohol use: Yes    Comment: reports occasional beer or wine. maybe two times monthly  . Drug use: No  . Sexual activity: Not on file  Lifestyle  . Physical activity:    Days per week: Not on file    Minutes per session: Not on file  . Stress: Not on file  Relationships  . Social connections:    Talks on phone: Not on file    Gets together: Not on file    Attends religious service: Not on file    Active member of club or organization: Not on file    Attends meetings of clubs or organizations: Not on file    Relationship status: Not on file  . Intimate partner violence:    Fear of current or ex partner: Not on file    Emotionally abused: Not on file    Physically abused: Not on file    Forced sexual activity: Not on file  Other Topics Concern  . Not on file  Social History Narrative  . Not on file    FAMILY HISTORY: Family History  Problem Relation Age of Onset  . Lung cancer Mother   . Heart attack Father   . Hypertension Sister   . Hypertension Brother   . Hypertension Sister     REVIEW OF SYSTEMS: Reviewed with the patient as per History of present illness. Psych: Patient denies having dental phobia.  DENTAL HISTORY: CHIEF COMPLAINT: Patient referred by Dr. Isidore Moos for a dental consultation.  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 Patient with anticipated chemoradiation therapy. Patient is now seen as part of a medically necessary pre-chemoradiation therapy dental protocol examination.  The patient currently denies acute toothaches, swellings, or abscesses. Patient was last seen by his dentist in December 2018 for an exam and cleaning. Patient sees Dr. Jeralene Huff as his primary dentist. This is in  [place], [place]. Patient is usually seen on an every 6 month basis. The patient has an existing maxillary cast partial denture. Patient has no lower partial denture. Patient denies having dental phobia. Patient denies having any unmet dental needs but is considering having a new maxillary cast partial denture fabricated in the future.   DENTAL EXAMINATION: GENERAL:  The patient is a well-developed, well-nourished male in no acute distress. HEAD AND NECK:  There is left neck lymphadenopathy. I am unable to palpate any right neck lymphadenopathy. The patient denies acute TMJ symptoms. Maxillary interincisal opening is measured at 40 mm. INTRAORAL EXAM:  The patient has normal saliva. There is no evidence of oral abscess formation. DENTITION:  Patient is missing tooth numbers 2, 3, 4, 9, 13, 14, 15, 18, 19, and 30.  The patient has a maxillary cast partial dentures replacing upper missing teeth. Patient has a bridge replacing tooth #9. The patient has occlusal and interincisal attrition affecting tooth numbers 20-29. PERIODONTAL:  Patient has chronic periodontitis with minimal plaque accumulations, selective areas of gingival recession, and no obvious tooth mobility. DENTAL CARIES/SUBOPTIMAL RESTORATIONS:  No obvious dental caries are noted. ENDODONTIC:  The patient denies acute pulpitis symptoms. I do not see any evidence of periapical pathology or radiolucency. Patient has had previous root canal therapy associated with tooth numbers 6, 8, 31, and 32. CROWN AND BRIDGE:  There are multiple crown or bridge restorations noted. These appear to be clinically acceptable. PROSTHODONTIC:  Patient has a maxillary cast partial denture that is clinically acceptable. OCCLUSION:  Patient has a poor occlusal scheme secondary to multiple missing teeth, supra-eruption and drifting of the unopposed teeth into the edentulous areas, deep overbite, and lack of replacement of all missing teeth with dental  prostheses.  RADIOGRAPHIC INTERPRETATION: Orthopantogram was taken and supplemented with a full series of dental radiographs. There are multiple missing teeth. There is incipient to moderate Gellis loss noted. There are no obvious periapical radiolucencies. There is supra-eruption and drifting of the unopposed teeth into the edentulous areas. There are multiple teeth with previous root canal therapies. Multiple dental restorations are noted. Multiple crown or bridge restorations are noted.There is pneumatization of the bilateral maxillary sinuses.   ASSESSMENTS: 1.  Squamous cell carcinoma left base of tongue  2. Pre-chemoradiation therapy dental protocol 3. Chronic periodontitis with [place]keson loss 4. Gingival recession 5. Accretions 6. Multiple missing teeth 7. Supra-eruption and drifting of the unopposed teeth into the edentulous areas 8. Occlusal and incisal attrition of the mandibular premolar and anterior teeth. 9. Deep overbite 10. Maxillary cast partial denture 11. Poor occlusal scheme but a stable occlusion 12. Risk for bleeding with invasive dental procedures due to current Eliquis therapy   PLAN/RECOMMENDATIONS: 1. I discussed the risks, benefits, and complications of various treatment options with the patient in relationship to his medical and dental conditions, anticipated chemoradiation therapy, and chemoradiation therapy side effects to include xerostomia, radiation caries, trismus, mucositis, taste changes, gum and jawbone changes, and risk for infection and osteoradionecrosis. We discussed various treatment options to include no treatment, extraction of tooth number 17 with alveoloplasty by an oral surgeon, periodontal therapy, dental restorations, root canal therapy, crown and bridge therapy, implant therapy, and replacement of missing teeth as indicated. The patient currently wishes to defer any dental extractions at this time. Dr. Isidore Moos is aware of this decision by the patient to  keep tooth numbers 17 and will do her best to contour the primary fields to keep the dose to the lower left third molar at or below 5000 cGy. Patient does agree to proceed with impressions today for the fabrication of fluoride trays and scatter protection devices.  The appliances will be inserted tomorrow at 11 AM. A prescription for FluoriSHIELD sodium fluoride was sent to [facility] with refills for one year. Normal directions.  Dr. Isidore Moos will schedule the patient for stimulation at her discretion. The patient is currently cleared for chemoradiation therapy.   2. Discussion of findings with medical team and coordination of future medical and dental care as needed.  I spent in excess of  120 minutes during  the conduct of this consultation and >50% of this time involved direct face-to-face encounter for counseling and/or coordination of the patient's care.    Lenn Cal, DDS

## 2018-01-01 NOTE — Telephone Encounter (Signed)
Appointments scheduled per 3/26 sch msg/ per Dr Isidore Moos

## 2018-01-02 ENCOUNTER — Ambulatory Visit
Admission: RE | Admit: 2018-01-02 | Discharge: 2018-01-02 | Disposition: A | Discharge status: Home / Self Care | Payer: No Typology Code available for payment source | Source: Ambulatory Visit | Attending: Radiation Oncology | Admitting: Radiation Oncology

## 2018-01-02 ENCOUNTER — Telehealth: Payer: Self-pay | Admitting: *Deleted

## 2018-01-02 ENCOUNTER — Other Ambulatory Visit: Payer: Self-pay

## 2018-01-02 ENCOUNTER — Encounter: Payer: Self-pay | Admitting: Radiation Oncology

## 2018-01-02 ENCOUNTER — Encounter (HOSPITAL_COMMUNITY): Payer: Self-pay | Admitting: Dentistry

## 2018-01-02 ENCOUNTER — Encounter: Payer: Self-pay | Admitting: *Deleted

## 2018-01-02 ENCOUNTER — Ambulatory Visit (HOSPITAL_COMMUNITY): Payer: Medicaid - Dental | Admitting: Dentistry

## 2018-01-02 VITALS — BP 120/58 | HR 66 | Temp 98.2°F | Ht 68.0 in | Wt 214.4 lb

## 2018-01-02 VITALS — BP 137/70 | HR 67 | Temp 98.6°F

## 2018-01-02 DIAGNOSIS — Z463 Encounter for fitting and adjustment of dental prosthetic device: Secondary | ICD-10-CM

## 2018-01-02 DIAGNOSIS — C01 Malignant neoplasm of base of tongue: Secondary | ICD-10-CM | POA: Diagnosis not present

## 2018-01-02 DIAGNOSIS — Z01818 Encounter for other preprocedural examination: Secondary | ICD-10-CM

## 2018-01-02 LAB — BUN & CREATININE (CHCC)
BUN: 19 mg/dL (ref 7–26)
Creatinine: 0.97 mg/dL (ref 0.70–1.30)
GFR, Est AFR Am: >60 mL/min (ref >60)
GFR, Estimated: >60 mL/min (ref >60)

## 2018-01-02 LAB — MAGNESIUM: MAGNESIUM: 2.5 mg/dL (ref 1.5–2.5)

## 2018-01-02 MED ORDER — SODIUM CHLORIDE 0.9% FLUSH
10.0000 mL | Freq: Once | INTRAVENOUS | Status: AC
  Administered 2018-01-02: 10 mL via INTRAVENOUS

## 2018-01-02 NOTE — Progress Notes (Signed)
Oncology Nurse Navigator Documentation  To provide support, encouragement and care continuity, met with Edgar Herrera during his CT SIM. He was accompanied by his wife.   He tolerated procedure without difficulty, denied questions/concerns.   I toured them to [facility] treatment area, explained procedures for lobby registration, arrival to Radiation Waiting, arrival to tmt area and preparation for tmt.  They voiced understanding.   I encouraged them to call me prior to 4/10 [place]. He indicated [facility], will follow-up with pre-authorization request for [place].  Gayleen Orem, RN, BSN Head & Neck Oncology Nurse [place] at [place] [phone]

## 2018-01-02 NOTE — Progress Notes (Signed)
01/02/2018  Patient Name:   Edgar Herrera Date of Birth:   1946-04-14 Medical Record Number: [id number]  BP 137/70 (BP Location: Right Arm)   Pulse 67   Temp 98.6 F (37 C)    [name] now presents for insertion of upper and lower fluoride trays and scatter protection devices.  PROCEDURE: Upper partial denture was removed. Appliances were tried in and adjusted as needed. [place] Trismus device was fabricated 40 mm using 22 sticks. Postop instructions were provided and a written and verbal format concerning the use and care of appliances. All questions were answered. Patient to return to clinic for periodic oral examination in approximately 2-3 weeks during radiation therapy. Patient to call if questions or problems arise before then.  Lenn Cal, DDS

## 2018-01-02 NOTE — Telephone Encounter (Signed)
Oncology Nurse Navigator Documentation  Spoke with [facility] Adriana Mccallum [phone] x [phone]).  She confirmed receipt of pre-auth request (after faxed 2nd time today by Toniann Ket) for [facility], Medical Oncology services, PT-Lymphedema.  She indicated she will process ASAP, contact me when obtained.  Gayleen Orem, RN, BSN Head & Neck Oncology Nurse [place] at [place] [phone]

## 2018-01-02 NOTE — Progress Notes (Signed)
Head and Neck Cancer Simulation, IMRT treatment planning, and Special treatment procedure note   Outpatient  Diagnosis:    ICD-10-CM   1. 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 An Aquaplast head and shoulder mask was custom fitted to the patient's anatomy. High-resolution CT axial imaging was obtained of the head and neck with contrast. I verified that the quality of the imaging is good for treatment planning. 1 Medically Necessary Treatment Device was fabricated and supervised by me: Aquaplast mask.   Treatment planning note I plan to treat the patient with IMRT. I plan to treat the patient's tumor and bilateral neck nodes. I plan to treat to a total dose of 70 Gray in 35  fractions. Dose calculation was ordered from dosimetry.  IMRT planning Note  IMRT is medically necessary and an important modality to deliver adequate dose to the patient's at risk tissues while sparing the patient's normal structures, including the: esophagus, parotid tissue, mandible, brain stem, spinal cord, oral cavity, brachial plexus.  This justifies the use of IMRT in the patient's treatment.   Special Treatment Procedure Note:  The patient will be receiving chemotherapy concurrently. Chemotherapy heightens the risk of side effects. I have considered this during the patient's treatment planning process and will monitor the patient accordingly for side effects on a weekly basis. Concurrent chemotherapy increases the complexity of this patient's treatment and therefore this constitutes a special treatment procedure.  -----------------------------------  [name], MD

## 2018-01-02 NOTE — Patient Instructions (Signed)

## 2018-01-03 ENCOUNTER — Encounter: Payer: Self-pay | Admitting: Hematology and Oncology

## 2018-01-03 ENCOUNTER — Telehealth: Payer: Self-pay | Admitting: Hematology and Oncology

## 2018-01-03 NOTE — Telephone Encounter (Signed)
Spoke to the pt to make aware an appt has been scheduled for him to see Dr. Lebron Conners on 4/2 at 240pm. New patient letter mailed.

## 2018-01-04 ENCOUNTER — Other Ambulatory Visit: Payer: Self-pay | Admitting: Oncology

## 2018-01-04 ENCOUNTER — Telehealth: Payer: Self-pay | Admitting: *Deleted

## 2018-01-04 ENCOUNTER — Telehealth: Payer: Self-pay | Admitting: Oncology

## 2018-01-04 ENCOUNTER — Ambulatory Visit
Admission: RE | Admit: 2018-01-04 | Discharge: 2018-01-04 | Disposition: A | Discharge status: Home / Self Care | Payer: No Typology Code available for payment source | Source: Ambulatory Visit | Attending: Radiation Oncology | Admitting: Radiation Oncology

## 2018-01-04 ENCOUNTER — Encounter: Payer: Self-pay | Admitting: *Deleted

## 2018-01-04 DIAGNOSIS — C01 Malignant neoplasm of base of tongue: Secondary | ICD-10-CM

## 2018-01-04 LAB — BUN & CREATININE (CHCC)
BUN: 13 mg/dL (ref 7–26)
CREATININE: 0.96 mg/dL (ref 0.70–1.30)
GFR, Est AFR Am: >60 mL/min (ref >60)
GFR, Estimated: >60 mL/min (ref >60)

## 2018-01-04 NOTE — Telephone Encounter (Addendum)
Called patient and advised him that he can start taking his metformin today according to his lab results from this morning: bun 13, creatinine 0.96.  He verbalized understanding and agreement.

## 2018-01-05 ENCOUNTER — Telehealth: Payer: Self-pay | Admitting: *Deleted

## 2018-01-05 NOTE — Telephone Encounter (Signed)
Oncology Nurse Navigator Documentation  Rec'd VMM from [place] indicating medical oncology services including PAC placement and PT have been pre-authorized, documentation should be received early next week. I updated Agricultural engineer by phone, notified Dr. Lebron Conners and Dr. Isidore Moos via routed note.  Gayleen Orem, RN, BSN Head & Neck Oncology Nurse [place] at [place] [phone]

## 2018-01-05 NOTE — Progress Notes (Addendum)
Oncology Nurse Navigator Documentation  Met patient and wife in Radiation Oncology Waiting at his request s/p appt with Dental Medicine. He indicated plan to have his dentist refer him to oral surgeon in [place] for tooth extraction.  They voiced concern about delay in starting RT with extraction, asked for Dr. Isidore Moos to share thoughts.     He and wife reiterated Dr. Edison Nasuti ([place]) has referred him to [facility] for PEG placement with otolaryngology supervision.  They further noted she wants placement prior to RT start (currently scheduled for 4/10), they expressed concern this will happen.  I indicated I would contact [facility] Navigator for follow-up, assured them patients have had PEG/PAC placements s/p start of tmt without compromise.  Gayleen Orem, RN, BSN Head & Neck Oncology Nurse [place] at [place] [phone]

## 2018-01-05 NOTE — Telephone Encounter (Addendum)
Oncology Nurse Navigator Documentation  Called pt, informed him I received call from [place] indicating she has received pre-authorization for MedOnc services, to be faxed early next week.  Gayleen Orem, RN, BSN Head & Neck Oncology Nurse [place] at [place] [phone]

## 2018-01-08 ENCOUNTER — Inpatient Hospital Stay (HOSPITAL_BASED_OUTPATIENT_CLINIC_OR_DEPARTMENT_OTHER): Payer: No Typology Code available for payment source | Admitting: Hematology and Oncology

## 2018-01-08 ENCOUNTER — Encounter: Payer: Self-pay | Admitting: Hematology and Oncology

## 2018-01-08 ENCOUNTER — Inpatient Hospital Stay: Payer: No Typology Code available for payment source | Attending: Radiation Oncology | Admitting: Nutrition

## 2018-01-08 ENCOUNTER — Telehealth: Payer: Self-pay | Admitting: Hematology and Oncology

## 2018-01-08 ENCOUNTER — Encounter: Payer: Self-pay | Admitting: *Deleted

## 2018-01-08 VITALS — BP 148/74 | HR 81 | Temp 98.9°F | Resp 20 | Ht 68.0 in | Wt 215.7 lb

## 2018-01-08 DIAGNOSIS — E119 Type 2 diabetes mellitus without complications: Secondary | ICD-10-CM | POA: Insufficient documentation

## 2018-01-08 DIAGNOSIS — C01 Malignant neoplasm of base of tongue: Secondary | ICD-10-CM

## 2018-01-08 DIAGNOSIS — Z5111 Encounter for antineoplastic chemotherapy: Secondary | ICD-10-CM | POA: Insufficient documentation

## 2018-01-08 MED ORDER — DEXAMETHASONE 4 MG PO TABS: 8.0000 mg | ORAL_TABLET | Freq: Every day | ORAL | 1 refills | Status: DC

## 2018-01-08 MED ORDER — ONDANSETRON HCL 8 MG PO TABS: 8.0000 mg | ORAL_TABLET | Freq: Two times a day (BID) | ORAL | 1 refills | Status: DC | PRN

## 2018-01-08 MED ORDER — LIDOCAINE-PRILOCAINE 2.5-2.5 % EX CREA: TOPICAL_CREAM | CUTANEOUS | 3 refills | Status: DC

## 2018-01-08 MED ORDER — PROCHLORPERAZINE MALEATE 10 MG PO TABS: 10.0000 mg | ORAL_TABLET | Freq: Four times a day (QID) | ORAL | 1 refills | Status: DC | PRN

## 2018-01-08 MED ORDER — LORAZEPAM 0.5 MG PO TABS: 0.5000 mg | ORAL_TABLET | Freq: Four times a day (QID) | ORAL | 0 refills | Status: DC | PRN

## 2018-01-08 NOTE — Progress Notes (Signed)
Oncology Nurse Navigator Documentation  Met with pt and his wife during initial consult with Dr. Perlov. They voiced understanding of chemotherapy role in conjunction with RT, proposed weekly carboplatin/paclitaxel. We discussed 4/9 surgical consult at [facility] for PEG placement, they understood I am in communication with [facility] Nav[facility] to arrange PAC placement same location. They voiced understanding of Dr. Perlov's recommendation for PICC placement so as not to delay start of chemo.. Wife indicated she will call me with name of Dr. Jacob's RN, Emy, to facilitate my request for PAC and PICC. I encouraged them to call me with questions/concerns.  Rick Diehl, RN, BSN Head & Neck Oncology Nurse Navigator [facility] at [place] [phone]  

## 2018-01-08 NOTE — Telephone Encounter (Signed)
Appointments scheduled AVS/Calendar printed per 4/2 los °

## 2018-01-08 NOTE — Progress Notes (Signed)
72 year old male diagnosed with tongue cancer to receive concurrent chemoradiation therapy.  Past medical history includes atrial fibrillation, hyperlipidemia, hypertension, prostate cancer, and diabetes.  Medications include Glucotrol, Glucophage, omega-3 fatty acids, and multivitamin.  Labs were reviewed.  Height: 68 inches. Weight: 214 pounds. Usual body weight: 220 pounds. BMI: 32.6.  Patient states he is scheduled to get a feeding tube placed. He received samples of Glucerna through the [facility]. He has not started drinking any nutritional supplements. He is eating all the food that he enjoys however he does have to chew well and eat softer foods.  Nutrition diagnosis:  Predicted suboptimal energy intake related to new diagnosis of tongue cancer and associated treatments as evidenced by condition for which research shows suboptimal energy intake.  Intervention: Patient educated to consume small frequent meals and snacks utilizing high calorie, soft, and high protein foods. Recommended patient drink oral nutrition supplements as needed. Provided brief education on tube feeding. Reviewed importance of increased fluids. Provided fact sheets.  Questions were answered.  Teach back method used.  Contact information was given.  Monitoring, evaluation, goals: Patient will tolerate adequate calories and protein to minimize weight loss throughout treatment.  Next visit: Tuesday, April 9 during head and neck clinic.  **Disclaimer: This note was dictated with voice recognition software. Similar sounding words can inadvertently be transcribed and this note may contain transcription errors which may not have been corrected upon publication of note.**

## 2018-01-09 ENCOUNTER — Inpatient Hospital Stay: Payer: No Typology Code available for payment source

## 2018-01-10 ENCOUNTER — Telehealth: Payer: Self-pay | Admitting: *Deleted

## 2018-01-10 NOTE — Telephone Encounter (Addendum)
Oncology Nurse Navigator Documentation Spoke with Edgar Herrera.   I confirmed receipt of her VMM yesterday that PAC placement has been approved for [facility] in conjunction with PEG.  I indicated I will fax information to [facility]. She confirmed PICC placement covered by current Medical Oncology authorization.  Dr. Lebron Conners informed. I provided her 4/10 RT start date, 4/2 consult date/time with Dr. Lebron Conners, 4/12 chemo start date.  Gayleen Orem, RN, BSN Head & Neck Oncology Nurse [place] at [place] [phone]

## 2018-01-14 ENCOUNTER — Telehealth: Payer: Self-pay | Admitting: *Deleted

## 2018-01-14 ENCOUNTER — Other Ambulatory Visit: Payer: Self-pay | Admitting: Hematology and Oncology

## 2018-01-14 DIAGNOSIS — C01 Malignant neoplasm of base of tongue: Secondary | ICD-10-CM | POA: Diagnosis not present

## 2018-01-14 DIAGNOSIS — Z510 Encounter for antineoplastic radiation therapy: Secondary | ICD-10-CM | POA: Insufficient documentation

## 2018-01-14 NOTE — Telephone Encounter (Signed)
Oncology Nurse Navigator Documentation  Spoke with pt, confirmed 0800 arrival tomorrow morning for H&N MDC, reviewed registration procedure.  He voiced understanding.  Gayleen Orem, RN, BSN Head & Neck Oncology Nurse [place] at [place] [phone]

## 2018-01-14 NOTE — Telephone Encounter (Signed)
Oncology Nurse Navigator Documentation  Faxed authorization information to [facility] applicable to PEG and PAC placement.  Provided [facility] navigator contact information for follow-up questions.  Notification of successful fax transmission received.   Gayleen Orem, RN, BSN Head & Neck Oncology Nurse [place] at [place] [phone]

## 2018-01-15 ENCOUNTER — Inpatient Hospital Stay: Payer: No Typology Code available for payment source | Admitting: Nutrition

## 2018-01-15 ENCOUNTER — Ambulatory Visit
Admission: RE | Admit: 2018-01-15 | Discharge: 2018-01-15 | Disposition: A | Discharge status: Home / Self Care | Payer: No Typology Code available for payment source | Source: Ambulatory Visit | Attending: Radiation Oncology | Admitting: Radiation Oncology

## 2018-01-15 ENCOUNTER — Ambulatory Visit: Payer: No Typology Code available for payment source | Admitting: Physical Therapy

## 2018-01-15 ENCOUNTER — Telehealth: Payer: Self-pay | Admitting: *Deleted

## 2018-01-15 ENCOUNTER — Encounter: Payer: Self-pay | Admitting: *Deleted

## 2018-01-15 ENCOUNTER — Other Ambulatory Visit: Payer: Self-pay

## 2018-01-15 ENCOUNTER — Ambulatory Visit: Payer: No Typology Code available for payment source | Attending: Radiation Oncology

## 2018-01-15 DIAGNOSIS — R29898 Other symptoms and signs involving the musculoskeletal system: Secondary | ICD-10-CM | POA: Diagnosis present

## 2018-01-15 DIAGNOSIS — R1312 Dysphagia, oropharyngeal phase: Secondary | ICD-10-CM | POA: Insufficient documentation

## 2018-01-15 DIAGNOSIS — C01 Malignant neoplasm of base of tongue: Secondary | ICD-10-CM

## 2018-01-15 DIAGNOSIS — R2689 Other abnormalities of gait and mobility: Secondary | ICD-10-CM | POA: Insufficient documentation

## 2018-01-15 DIAGNOSIS — R293 Abnormal posture: Secondary | ICD-10-CM | POA: Insufficient documentation

## 2018-01-15 DIAGNOSIS — Z9189 Other specified personal risk factors, not elsewhere classified: Secondary | ICD-10-CM

## 2018-01-15 NOTE — Progress Notes (Signed)
Nutrition follow-up completed during head and neck clinic.  Patient will receive concurrent chemoradiation therapy for tongue cancer. Weight is stable at 214.8 pounds. Patient reports he has really increased oral intake and has tried Glucerna by mouth providing about 220 cal per bottle. He does not have his feeding tube placed yet.   Patient verbalizes he has the willpower to force himself to eat throughout treatment.  Nutrition diagnosis: Predicted suboptimal energy intake continues.  Intervention: Encourage patient to continue strategies for increased oral intake including Glucerna once a day. Educated patient to work with [facility] and team to provide tube feeding formula when needed. Questions were answered teach back method used  Monitoring, evaluation, goals: Patient is tolerating adequate calories and protein and has maintained weight.  Next visit: Friday, April 19 during chemotherapy.  **Disclaimer: This note was dictated with voice recognition software. Similar sounding words can inadvertently be transcribed and this note may contain transcription errors which may not have been corrected upon publication of note.**

## 2018-01-15 NOTE — Therapy (Signed)
[facility] [address] [place], [place], [address] Phone: [phone]   Fax:  [phone]  Speech Language Pathology Evaluation  Patient Details  Name: Edgar Herrera MRN: [id number] Date of Birth: [birth year] Referring Provider: [name] MD   Encounter Date: 01/15/2018  End of Session - 01/15/18 1434    Visit Number  1    Number of Visits  4    Date for SLP Re-Evaluation  04/15/18 (90 days)    SLP Start Time  1015    SLP Stop Time   1045    SLP Time Calculation (min)  30 min    Activity Tolerance  Patient tolerated treatment well       Past Medical History:  Diagnosis Date  . Allergic rhinitis   . Atrial fibrillation ([place])   . Diabetes mellitus without complication ([place])   . Hyperlipidemia   . Hypertension   . Prostate cancer [facility])     Past Surgical History:  Procedure Laterality Date  . KNEE SURGERY Bilateral    2007 and 1998,   . PROSTATECTOMY  12/15/2006  . SKIN GRAFT     as a child left elbow  . TONSILECTOMY/ADENOIDECTOMY WITH MYRINGOTOMY     as a child    There were no vitals filed for this visit.  Subjective Assessment - 01/15/18 0943    Subjective  Pt here with wife today    Patient is accompained by:  Family member wife    Currently in Pain?  No/denies         SLP Evaluation [facility] - 01/15/18 0943      SLP Visit Information   SLP Received On  01/15/18    Referring Provider  [name] MD    Onset Date  End of February 2019    Medical Diagnosis  base of tongue ISCC      General Information   HPI  Pt is 21 YOM with ISCC of base of tongue with lt neck adenopathy. Will undergo chemorad - rad begins 01-16-18, chemo 01-18-18. PEG and PAC placed at [facility] today with a PICC line.       Oral Motor/Sensory Function   Overall Oral Motor/Sensory Function  Appears within functional limits for tasks assessed    Overall Oral Motor/Sensory Function  Pt reports "coughing some" with meals  (primarily with solids), and pt has stayed away from more dense foods and tougher foods. This has occurred in the last month or so.       Motor Speech   Overall Motor Speech  Appears within functional limits for tasks assessed       Pt currently tolerates soft diet with some regular items, and thin liquids. POs: Pt ate [place] sandwich with reported mild pharyngeal residue cleared with liqiud wash. SLP had pt trial lt head turn and right head turn and pt reported lt head turn helped reduce reported pharyngeal residue. Pt was without overt s/s aspiration. Thyroid elevation appeared adequate, and swallows appeared timely, despite extended oral stage for thorough mastication. Oral residue noted as minimal.   Because data states the risk for dysphagia during and after radiation treatment is high due to undergoing radiation tx, SLP taught pt about the possibility of reduced/limited ability for PO intake during rad tx. SLP encouraged pt to continue swallowing POs as far into rad tx as possible, even ingesting POs and/or completing HEP shortly after administration of pain meds.   SLP educated pt re: changes to swallowing musculature  after rad tx, and why adherence to dysphagia HEP provided today and PO consumption was necessary to inhibit muscular disuse atrophy and to reduce muscle fibrosis following rad tx. Pt demonstrated understanding of these things to SLP.    SLP then developed a HEP for pt and pt was instructed how to perform exercises involving lingual, vocal, and pharyngeal strengthening. SLP performed each exercise and pt return demonstrated each exercise. SLP ensured pt performance was correct prior to moving on to next exercise. Pt was instructed to complete this program 2 times a day until 6 months after his last rad tx, then x2 a week after that.                 SLP Education - 01/15/18 1433    Education provided  Yes    Education Details  HEP for swallowing, late effects  head/neck cancer on swallowing ability, head turn to lt prior to swallow    Person(s) Educated  Patient;Spouse    Methods  Explanation;Demonstration;Verbal cues;Handout    Comprehension  Verbalized understanding;Returned demonstration;Verbal cues required;Need further instruction       SLP Short Term Goals - 01/15/18 1440      SLP SHORT TERM GOAL #1   Title  pt will complete HEP with rare min A     Time  2    Period  -- visits (visit #3)    Status  New      SLP SHORT TERM GOAL #2   Title  pt will tell SLP why he is completing HEP     Time  1    Period  -- visits (visit #2)    Status  New      SLP SHORT TERM GOAL #3   Title  pt will demo head turn to lt with solids, if necessary, with rare min A    Time  1    Period  -- visits (visit #2)    Status  New      SLP SHORT TERM GOAL #4   Title  pt will tell SLP 3 overt s/s aspiration PNA with modified independence    Time  2    Period  -- vists (visit #3)    Status  New       SLP Long Term Goals - 01/15/18 1442      SLP LONG TERM GOAL #1   Title  demo HEP with indpependence     Time  3    Period  -- visits (visit #4)    Status  New      SLP LONG TERM GOAL #2   Title  pt will tell SLP when HEP frequency can be reduced to x2-3/week    Time  3    Period  -- visits (visit #4)    Status  New       Plan - 01/15/18 1436    Clinical Impression Statement  Pt with oropharyngeal swallowing WFL with head turn to lt with solids. However the probability of swallowing difficulty increases dramatically with the initiation of chemo and radiation therapy. Pt will need to be followed by SLP for regular assessment of accurate HEP completion as well as for safety with POs both during and following treatment/s.    Speech Therapy Frequency  -- approx once every four weeks    Duration  -- 3 sessions    Treatment/Interventions  Aspiration precaution training;Pharyngeal strengthening exercises;Diet toleration management by SLP;Trials of  upgraded texture/liquids;Internal/external aids;Patient/family education;Compensatory strategies;SLP  instruction and feedback;Cueing hierarchy;Environmental controls    Potential to Achieve Goals  Good    SLP Home Exercise Plan  provided today    Consulted and Agree with Plan of Care  Patient       Patient will benefit from skilled therapeutic intervention in order to improve the following deficits and impairments:   Dysphagia, oropharyngeal phase    Problem List Patient Active Problem List   Diagnosis Date Noted  . Squamous cell carcinoma of base of tongue ([place]) 01/01/2018  . Atrial flutter ([place]) 05/14/2017  . Chronic anticoagulation 05/14/2017  . Essential hypertension 05/14/2017  . Paroxysmal atrial fibrillation ([place]) 05/13/2017    [place] ,[place], [place]  01/15/2018, 2:45 PM  [facility] [address] [place], [place], [address] Phone: [phone]   Fax:  [phone]  Name: Edgar Herrera MRN: [id number] Date of Birth: 1946/09/07

## 2018-01-15 NOTE — Telephone Encounter (Signed)
Oncology Nurse Navigator Documentation  Rec'd call from patient s/p this afternoon's [facility] surgical consult.  He stated appt with anaesthesiologist 4/18 for 4/19 PAC and PEG placement.  I indicated I will address adjustments to [facility] appts scheduled for 4/19, asked him to call [facility] to request late morning procedure to accommodate 9:20 [place] appt if possible.  Gayleen Orem, RN, BSN Head & Neck Oncology Nurse [place] at [place] [phone]

## 2018-01-15 NOTE — Patient Instructions (Signed)
Turn head to left when swallowing solids- this may well help the food clear through the throat like you stated it did today.  SWALLOWING EXERCISES Do these 6 of the 7 days per week until 6 months after your last day of radiation, then 2 times per week afterwards  1. Effortful Swallows - Press your tongue against the roof of your mouth for 3 seconds, then squeeze          the muscles in your neck while you swallow your saliva or a sip of water - Repeat 15-20 times, 2-3 times a day, and use whenever you eat or drink  2. Masako Swallow - swallow with your tongue sticking out - Stick tongue out past your teeth and gently bite tongue with your teeth - Swallow, while holding your tongue with your teeth - Repeat 15-20 times, 2-3 times a day *use a wet spoon if your mouth gets dry*  3. Pitch Raise - Repeat "he", once per second in as high of a pitch as you can - Repeat 20 times, 2-3 times a day  4. Shaker Exercise - head lift - Lie flat on your back in your bed or on a couch without pillows - Raise your head and look at your feet - KEEP YOUR SHOULDERS DOWN - HOLD FOR 45-60 SECONDS, then lower your head back down - Repeat 3 times, 2-3 times a day  5. Mendelsohn Maneuver - "half swallow" exercise - Start to swallow, and keep your Adam's apple up by squeezing hard with the            muscles of the throat - Hold the squeeze for 5-7 seconds and then relax - Repeat 15-20 times, 2-3 times a day *use a wet spoon if your mouth gets dry*  6. Breath Hold - Say "HUH!" loudly, then hold your breath for 3 seconds at your voice box - Repeat 20 times, 2-3 times a day  7. Chin pushback - Open your mouth  - Place your fist UNDER your chin near your neck, and push back with your fist for 5 seconds - Repeat 10 times, 2-3 times a day

## 2018-01-15 NOTE — Therapy (Signed)
[place], [place], [address] Phone: [phone]   Fax:  [phone]  Physical Therapy Evaluation  Patient Details  Name: Edgar Herrera MRN: [id number] Date of Birth: [birth year] Referring Provider: Dr. [name]   Encounter Date: 01/15/2018  PT End of Session - 01/15/18 0957    Visit Number  1    Number of Visits  1    PT Start Time  0830    PT Stop Time  0900    PT Time Calculation (min)  30 min    Activity Tolerance  Patient tolerated treatment well    Behavior During Therapy  [facility] for tasks assessed/performed       Past Medical History:  Diagnosis Date  . Allergic rhinitis   . Atrial fibrillation ([place])   . Diabetes mellitus without complication ([place])   . Hyperlipidemia   . Hypertension   . Prostate cancer [facility])     Past Surgical History:  Procedure Laterality Date  . KNEE SURGERY Bilateral    2007 and 1998,   . PROSTATECTOMY  12/15/2006  . SKIN GRAFT     as a child left elbow  . TONSILECTOMY/ADENOIDECTOMY WITH MYRINGOTOMY     as a child    There were no vitals filed for this visit.   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 neck adenopathy.  Will start concurrent chemoradiation on 01/16/18 for 35 fractions of RT.  h/o bilateral knee arthroscopies, gout in feet, atrial fib, DM, HTN, prostate CA, vertigo.    Patient Stated Goals  get info from all head & neck clinic providers    Currently in Pain?  No/denies    Pain Score  0-No pain    Pain Location  Throat    Pain Orientation  Left    Pain Descriptors / Indicators  Other (Comment) describes "aggravation"    Aggravating Factors   when he swallows    Pain Relieving Factors  at rest         [facility] PT  Assessment - 01/15/18 0001      Assessment   Medical Diagnosis  invasive squamouscell carcinoma at base of tongue, p16+ with left neck adenopathy    Referring Provider  Dr. [name]    Hand Dominance  Right    Prior Therapy  none      Precautions   Precautions  Other (comment)    Precaution Comments  cancer precautions      Restrictions   Weight Bearing Restrictions  No      Balance Screen   Has the patient fallen in the past 6 months  No    Has the patient had a decrease in activity level because of a fear of falling?   No    Is the patient reluctant to leave their home because of a fear of falling?   No      Home Environment   Living Environment  Private residence    Type of [place]  Two level;Able to live on main level with bedroom/bathroom      Prior Function   Level of Independence  Independent except doesn't go up/down stairs much  Leisure  active with walking 5x/wk, doing water aerobics, golf, stationary bike, sometimes resistance training and table tennis      Cognition   Overall Cognitive Status  Within Functional Limits for tasks assessed      Observation/Other Assessments   Observations  unremarkable      Functional Tests   Functional tests  Sit to Stand      Sit to Stand   Comments  6 times in 30 seconds, poor for his age      Posture/Postural Control   Posture/Postural Control  Postural limitations    Postural Limitations  Forward head;Rounded Shoulders significant      ROM / Strength   AROM / PROM / Strength  AROM      AROM   Overall AROM Comments  neck AROM grossly 25% limited except 50% limited for sidebending bilat.; shoulder AROM WFL,but pt. reports feeling something in his shoulders with movement, from arthritis      Ambulation/Gait   Ambulation/Gait  Yes    Ambulation/Gait Assistance  6: Modified independent (Device/Increase time)    Gait Comments  Says he stumbles some and is unsteady, and that this may be  caused by his medications.  He has discussed this with his [facility] doctors. Reports ability to negotiate stairs but with some difficulty and avoids them; uses railing. Wife also reports his walking is limited by foot and leg pain.        LYMPHEDEMA/ONCOLOGY QUESTIONNAIRE - 01/15/18 0953      Type   Cancer Type  squamous cell of base of tongue      Treatment   Active Chemotherapy Treatment  -- to start 01/18/18    Active Radiation Treatment  -- to start 01/16/18      Lymphedema Assessments   Lymphedema Assessments  Head and Neck      Head and Neck   4 cm superior to sternal notch around neck  42.3 cm    6 cm superior to sternal notch around neck  43.4 cm    8 cm superior to sternal notch around neck  45.5 cm             Objective measurements completed on examination: See above findings.              PT Education - 01/15/18 0956    Education provided  Yes    Education Details  neck ROM, posture, walking, CURE article on staying active, "Why exercise?" flyer, PT and lymphedema info    Person(s) Educated  Patient;Spouse    Methods  Explanation;Handout    Comprehension  Verbalized understanding              Head and Neck Clinic Goals - 01/15/18 1002      Patient will be able to verbalize understanding of a home exercise program for cervical range of motion, posture, and walking.    Status  Achieved      Patient will be able to verbalize understanding of proper sitting and standing posture.    Status  Achieved      Patient will be able to verbalize understanding of lymphedema risk and availability of treatment for this condition.    Status  Achieved         Plan - 01/15/18 0957    Clinical Impression Statement  Very pleasant gentleman who sounds like he has led an active lifestyle until he received this diagnosis of squamous cell carcinoma of base of tongue, and has been  busy with appointments since then.  He has significant forward  head posture and rounded shoulders; some limitation in neck ROM that probably results from arthritis, foot pain from gout.    History and Personal Factors relevant to plan of care:  foot pain from gout, h/o knee problems with arthroscopies, vertigo    Clinical Presentation  Evolving    Clinical Presentation due to:  new cancer diagnosis and is to start chemoradiation treatment this week    Clinical Decision Making  Moderate    Rehab Potential  Good    PT Frequency  One time visit    PT Treatment/Interventions  Patient/family education    PT Next Visit Plan  no follow-up planned at this time; he is at risk for lymphedema and will need follow-up should this develop    PT Home Exercise Plan  walking or other exercise, posture and neck ROM exercise    Consulted and Agree with Plan of Care  Patient       Patient will benefit from skilled therapeutic intervention in order to improve the following deficits and impairments:  Postural dysfunction, Decreased range of motion, Decreased mobility  Visit Diagnosis: Squamous cell carcinoma of base of tongue (HCC) - Plan: PT plan of care cert/re-cert  Abnormal posture - Plan: PT plan of care cert/re-cert  Other abnormalities of gait and mobility - Plan: PT plan of care cert/re-cert  At risk for lymphedema - Plan: PT plan of care cert/re-cert  Other symptoms and signs involving the musculoskeletal system - Plan: PT plan of care cert/re-cert     Problem List Patient Active Problem List   Diagnosis Date Noted  . Squamous cell carcinoma of base of tongue ([place]) 01/01/2018  . Atrial flutter ([place]) 05/14/2017  . Chronic anticoagulation 05/14/2017  . Essential hypertension 05/14/2017  . Paroxysmal atrial fibrillation ([place]) 05/13/2017    [place] 01/15/2018, 10:05 AM  [place], [place], [address] Phone: [phone]   Fax:  [phone]  Name: Edgar Herrera MRN:  [id number] Date of Birth: 09-28-1946  Serafina Royals, PT 01/15/18 10:05 AM

## 2018-01-16 ENCOUNTER — Ambulatory Visit
Admission: RE | Admit: 2018-01-16 | Discharge: 2018-01-16 | Disposition: A | Discharge status: Home / Self Care | Payer: No Typology Code available for payment source | Source: Ambulatory Visit | Attending: Radiation Oncology | Admitting: Radiation Oncology

## 2018-01-16 ENCOUNTER — Encounter: Payer: Self-pay | Admitting: *Deleted

## 2018-01-16 ENCOUNTER — Other Ambulatory Visit: Payer: Self-pay

## 2018-01-16 DIAGNOSIS — C01 Malignant neoplasm of base of tongue: Secondary | ICD-10-CM

## 2018-01-16 DIAGNOSIS — Z510 Encounter for antineoplastic radiation therapy: Secondary | ICD-10-CM | POA: Diagnosis not present

## 2018-01-16 MED ORDER — SONAFINE EX EMUL
1.0000 "application " | Freq: Once | CUTANEOUS | Status: AC
  Administered 2018-01-16: 1 via TOPICAL

## 2018-01-16 NOTE — Progress Notes (Signed)

## 2018-01-17 ENCOUNTER — Telehealth: Payer: Self-pay | Admitting: *Deleted

## 2018-01-17 ENCOUNTER — Ambulatory Visit
Admission: RE | Admit: 2018-01-17 | Discharge: 2018-01-17 | Disposition: A | Discharge status: Home / Self Care | Payer: No Typology Code available for payment source | Source: Ambulatory Visit | Attending: Radiation Oncology | Admitting: Radiation Oncology

## 2018-01-17 DIAGNOSIS — Z510 Encounter for antineoplastic radiation therapy: Secondary | ICD-10-CM | POA: Diagnosis not present

## 2018-01-17 NOTE — Telephone Encounter (Signed)
Oncology Nurse Navigator Documentation  In follow-up to pt's indication yesterday [facility], I called Navigator Adriana Mccallum.  I explained his chemo regime is carboplatin/paclitaxel q7d and not HD/LD cisplatin for which hearing loss is a potential SE.  I asked her to confirm further audiology evals are necessary.  Gayleen Orem, RN, BSN Head & Neck Oncology Nurse [place] at [place] [phone]

## 2018-01-17 NOTE — Progress Notes (Signed)
Oncology Nurse Navigator Documentation  To provide support, encouragement and care continuity, met with Mr. Hidalgo for his initial  RT.  He was accompanied by his wife.  I reviewed the 2-step treatment process, answered questions.   His wife observed the treatment, RTT Cierra explained procedure.  Mrs. Krygier expressed appreciation for the learning opportunity.  Mr. Petrovic completed treatment without difficulty, denied questions/concerns.  I reviewed the registration/arrival procedure for subsequent treatments.  I encouraged them to call me with questions/concerns as tmts proceed.  Gayleen Orem, RN, BSN Head & Neck Oncology Nurse [place] at [place] [phone]

## 2018-01-17 NOTE — Progress Notes (Signed)
Oncology Nurse Navigator Documentation  Met with Edgar Herrera upon his arrival for [facility]N [place].  He was accompanied by his wife.  Provided verbal and written overview of [place], the clinicians who will be seeing him, encouraged him to ask questions during his time with them.  He was seen by Nutrition, SLP, PT, SW and RadOnc Investment banker, operational.  Spoke with him at end of [facility], addressed questions. I encouraged him to call me with needs/concerns.  Gayleen Orem, RN, BSN Head & Neck Oncology [place] at [place] [phone]

## 2018-01-18 ENCOUNTER — Inpatient Hospital Stay (HOSPITAL_BASED_OUTPATIENT_CLINIC_OR_DEPARTMENT_OTHER): Payer: No Typology Code available for payment source | Admitting: Hematology and Oncology

## 2018-01-18 ENCOUNTER — Ambulatory Visit
Admission: RE | Admit: 2018-01-18 | Discharge: 2018-01-18 | Disposition: A | Discharge status: Home / Self Care | Payer: No Typology Code available for payment source | Source: Ambulatory Visit | Attending: Radiation Oncology | Admitting: Radiation Oncology

## 2018-01-18 ENCOUNTER — Inpatient Hospital Stay: Payer: No Typology Code available for payment source

## 2018-01-18 ENCOUNTER — Telehealth: Payer: Self-pay | Admitting: Hematology and Oncology

## 2018-01-18 ENCOUNTER — Encounter: Payer: Self-pay | Admitting: Hematology and Oncology

## 2018-01-18 VITALS — BP 125/68 | HR 66 | Temp 98.2°F | Resp 16

## 2018-01-18 VITALS — BP 148/78 | HR 74 | Temp 98.4°F | Resp 18 | Ht 68.0 in | Wt 213.8 lb

## 2018-01-18 DIAGNOSIS — Z5111 Encounter for antineoplastic chemotherapy: Secondary | ICD-10-CM | POA: Diagnosis not present

## 2018-01-18 DIAGNOSIS — C01 Malignant neoplasm of base of tongue: Secondary | ICD-10-CM

## 2018-01-18 DIAGNOSIS — Z510 Encounter for antineoplastic radiation therapy: Secondary | ICD-10-CM | POA: Diagnosis not present

## 2018-01-18 DIAGNOSIS — E119 Type 2 diabetes mellitus without complications: Secondary | ICD-10-CM

## 2018-01-18 LAB — CMP (CANCER CENTER ONLY)
ALK PHOS: 76 U/L (ref 40–150)
ALT: 18 U/L (ref 0–55)
AST: 15 U/L (ref 5–34)
Albumin: 3.4 g/dL — ABNORMAL LOW (ref 3.5–5.0)
Anion gap: 10 (ref 3–11)
BILIRUBIN TOTAL: 0.4 mg/dL (ref 0.2–1.2)
BUN: 13 mg/dL (ref 7–26)
CALCIUM: 9.5 mg/dL (ref 8.4–10.4)
CO2: 28 mmol/L (ref 22–29)
CREATININE: 0.94 mg/dL (ref 0.70–1.30)
Chloride: 103 mmol/L (ref 98–109)
GFR, Estimated: >60 mL/min (ref >60)
Glucose, Bld: 119 mg/dL (ref 70–140)
Potassium: 4 mmol/L (ref 3.5–5.1)
Sodium: 141 mmol/L (ref 136–145)
TOTAL PROTEIN: 7.1 g/dL (ref 6.4–8.3)

## 2018-01-18 LAB — CBC WITH DIFFERENTIAL (CANCER CENTER ONLY)
BASOS PCT: 0 %
Basophils Absolute: 0 10*3/uL (ref 0.0–0.1)
EOS ABS: 0.2 10*3/uL (ref 0.0–0.5)
EOS PCT: 2 %
HCT: 42.5 % (ref 38.4–49.9)
HEMOGLOBIN: 13.8 g/dL (ref 13.0–17.1)
Lymphocytes Relative: 21 %
Lymphs Abs: 1.7 10*3/uL (ref 0.9–3.3)
MCH: 28.3 pg (ref 27.2–33.4)
MCHC: 32.5 g/dL (ref 32.0–36.0)
MCV: 87.1 fL (ref 79.3–98.0)
Monocytes Absolute: 0.8 10*3/uL (ref 0.1–0.9)
Monocytes Relative: 10 %
NEUTROS PCT: 67 %
Neutro Abs: 5.4 10*3/uL (ref 1.5–6.5)
PLATELETS: 192 10*3/uL (ref 140–400)
RBC: 4.88 MIL/uL (ref 4.20–5.82)
RDW: 13.3 % (ref 11.0–14.6)
WBC: 8.1 10*3/uL (ref 4.0–10.3)

## 2018-01-18 LAB — MAGNESIUM: MAGNESIUM: 2 mg/dL (ref 1.5–2.5)

## 2018-01-18 LAB — PHOSPHORUS: Phosphorus: 3.9 mg/dL (ref 2.5–4.6)

## 2018-01-18 MED ORDER — SODIUM CHLORIDE 0.9 % IV SOLN
20.0000 mg | Freq: Once | INTRAVENOUS | Status: AC
  Administered 2018-01-18: 20 mg via INTRAVENOUS
  Filled 2018-01-18: qty 2

## 2018-01-18 MED ORDER — PALONOSETRON HCL INJECTION 0.25 MG/5ML
INTRAVENOUS | Status: AC
  Filled 2018-01-18: qty 5

## 2018-01-18 MED ORDER — FAMOTIDINE IN NACL 20-0.9 MG/50ML-% IV SOLN
20.0000 mg | Freq: Once | INTRAVENOUS | Status: AC
  Administered 2018-01-18: 20 mg via INTRAVENOUS

## 2018-01-18 MED ORDER — FAMOTIDINE IN NACL 20-0.9 MG/50ML-% IV SOLN
INTRAVENOUS | Status: AC
  Filled 2018-01-18: qty 50

## 2018-01-18 MED ORDER — DIPHENHYDRAMINE HCL 50 MG/ML IJ SOLN
INTRAMUSCULAR | Status: AC
  Filled 2018-01-18: qty 1

## 2018-01-18 MED ORDER — SODIUM CHLORIDE 0.9 % IV SOLN
45.0000 mg/m2 | Freq: Once | INTRAVENOUS | Status: AC
  Administered 2018-01-18: 96 mg via INTRAVENOUS
  Filled 2018-01-18: qty 16

## 2018-01-18 MED ORDER — SODIUM CHLORIDE 0.9 % IV SOLN
Freq: Once | INTRAVENOUS | Status: AC
  Administered 2018-01-18: 13:00:00 via INTRAVENOUS

## 2018-01-18 MED ORDER — CARBOPLATIN CHEMO INJECTION 450 MG/45ML
232.0000 mg | Freq: Once | INTRAVENOUS | Status: AC
  Administered 2018-01-18: 230 mg via INTRAVENOUS
  Filled 2018-01-18: qty 23

## 2018-01-18 MED ORDER — PALONOSETRON HCL INJECTION 0.25 MG/5ML
0.2500 mg | Freq: Once | INTRAVENOUS | Status: AC
  Administered 2018-01-18: 0.25 mg via INTRAVENOUS

## 2018-01-18 MED ORDER — DIPHENHYDRAMINE HCL 50 MG/ML IJ SOLN
50.0000 mg | Freq: Once | INTRAMUSCULAR | Status: AC
  Administered 2018-01-18: 50 mg via INTRAVENOUS

## 2018-01-18 NOTE — Telephone Encounter (Signed)
Due to no availability in infusion per Deatra Canter we will need to keep appointments as scheduled.  I talked with kristin Swindle regarding this also.

## 2018-01-18 NOTE — Patient Instructions (Signed)
[facility] Discharge Instructions for Patients Receiving Chemotherapy  Today you received the following chemotherapy agents Taxol and Carboplatin   To help prevent nausea and vomiting after your treatment, we encourage you to take your nausea medication as directed. No Zofran for 3 days. Take Compazine instead.   If you develop nausea and vomiting that is not controlled by your nausea medication, call the clinic.   BELOW ARE SYMPTOMS THAT SHOULD BE REPORTED IMMEDIATELY:  *FEVER GREATER THAN 100.5 F  *CHILLS WITH OR WITHOUT FEVER  NAUSEA AND VOMITING THAT IS NOT CONTROLLED WITH YOUR NAUSEA MEDICATION  *UNUSUAL SHORTNESS OF BREATH  *UNUSUAL BRUISING OR BLEEDING  TENDERNESS IN MOUTH AND THROAT WITH OR WITHOUT PRESENCE OF ULCERS  *URINARY PROBLEMS  *BOWEL PROBLEMS  UNUSUAL RASH Items with * indicate a potential emergency and should be followed up as soon as possible.  Feel free to call the clinic should you have any questions or concerns. The clinic phone number is [phone].  Please show the [facility] at check-in to the Emergency Department and triage nurse.  Paclitaxel injection What is this medicine? PACLITAXEL (PAK li TAX el) is a chemotherapy drug. It targets fast dividing cells, like cancer cells, and causes these cells to die. This medicine is used to treat ovarian cancer, breast cancer, and other cancers. This medicine may be used for other purposes; ask your health care provider or pharmacist if you have questions. COMMON BRAND NAME(S): Onxol, Taxol What should I tell my health care provider before I take this medicine? They need to know if you have any of these conditions: -blood disorders -irregular heartbeat -infection (especially a virus infection such as chickenpox, cold sores, or herpes) -liver disease -previous or ongoing radiation therapy -an unusual or allergic reaction to paclitaxel, alcohol, polyoxyethylated castor oil, other  chemotherapy agents, other medicines, foods, dyes, or preservatives -pregnant or trying to get pregnant -breast-feeding How should I use this medicine? This drug is given as an infusion into a vein. It is administered in a hospital or clinic by a specially trained health care professional. Talk to your pediatrician regarding the use of this medicine in children. Special care may be needed. Overdosage: If you think you have taken too much of this medicine contact a poison control center or emergency room at once. NOTE: This medicine is only for you. Do not share this medicine with others. What if I miss a dose? It is important not to miss your dose. Call your doctor or health care professional if you are unable to keep an appointment. What may interact with this medicine? Do not take this medicine with any of the following medications: -disulfiram -metronidazole This medicine may also interact with the following medications: -cyclosporine -diazepam -ketoconazole -medicines to increase blood counts like filgrastim, pegfilgrastim, sargramostim -other chemotherapy drugs like cisplatin, doxorubicin, epirubicin, etoposide, teniposide, vincristine -quinidine -testosterone -vaccines -verapamil Talk to your doctor or health care professional before taking any of these medicines: -acetaminophen -aspirin -ibuprofen -ketoprofen -naproxen This list may not describe all possible interactions. Give your health care provider a list of all the medicines, herbs, non-prescription drugs, or dietary supplements you use. Also tell them if you smoke, drink alcohol, or use illegal drugs. Some items may interact with your medicine. What should I watch for while using this medicine? Your condition will be monitored carefully while you are receiving this medicine. You will need important blood work done while you are taking this medicine. This medicine can cause serious  To reduce your risk  you will need to take other medicine(s) before treatment with this medicine. If you experience allergic reactions like skin rash, itching or hives, swelling of the face, lips, or tongue, tell your doctor or health care professional right away. In some cases, you may be given additional medicines to help with side effects. Follow all directions for their use. This drug may make you feel generally unwell. This is not uncommon, as chemotherapy can affect healthy cells as well as cancer cells. Report any side effects. Continue your course of treatment even though you feel ill unless your doctor tells you to stop. Call your doctor or health care professional for advice if you get a fever, chills or sore throat, or other symptoms of a cold or flu. Do not treat yourself. This drug decreases your body's ability to fight infections. Try to avoid being around people who are sick. This medicine may increase your risk to bruise or bleed. Call your doctor or health care professional if you notice any unusual bleeding. Be careful brushing and flossing your teeth or using a toothpick because you may get an infection or bleed more easily. If you have any dental work done, tell your dentist you are receiving this medicine. Avoid taking products that contain aspirin, acetaminophen, ibuprofen, naproxen, or ketoprofen unless instructed by your doctor. These medicines may hide a fever. Do not become pregnant while taking this medicine. Women should inform their doctor if they wish to become pregnant or think they might be pregnant. There is a potential for serious side effects to an unborn child. Talk to your health care professional or pharmacist for more information. Do not breast-feed an infant while taking this medicine. Men are advised not to father a child while receiving this medicine. This product may contain alcohol. Ask your pharmacist or healthcare provider if this medicine contains alcohol. Be sure to tell all  healthcare providers you are taking this medicine. Certain medicines, like metronidazole and disulfiram, can cause an unpleasant reaction when taken with alcohol. The reaction includes flushing, headache, nausea, vomiting, sweating, and increased thirst. The reaction can last from 30 minutes to several hours. What side effects may I notice from receiving this medicine? Side effects that you should report to your doctor or health care professional as soon as possible: -allergic reactions like skin rash, itching or hives, swelling of the face, lips, or tongue -low blood counts - This drug may decrease the number of white blood cells, red blood cells and platelets. You may be at increased risk for infections and bleeding. -signs of infection - fever or chills, cough, sore throat, pain or difficulty passing urine -signs of decreased platelets or bleeding - bruising, pinpoint red spots on the skin, black, tarry stools, nosebleeds -signs of decreased red blood cells - unusually weak or tired, fainting spells, lightheadedness -breathing problems -chest pain -high or low blood pressure -mouth sores -nausea and vomiting -pain, swelling, redness or irritation at the injection site -pain, tingling, numbness in the hands or feet -slow or irregular heartbeat -swelling of the ankle, feet, hands Side effects that usually do not require medical attention (report to your doctor or health care professional if they continue or are bothersome): -[place] pain -complete hair loss including hair on your head, underarms, pubic hair, eyebrows, and eyelashes -changes in the color of fingernails -diarrhea -loosening of the fingernails -loss of appetite -muscle or joint pain -red flush to skin -sweating This list may not describe all possible side   possible side effects. Call your doctor for medical advice about side effects. You may report side effects to FDA at 1-800-FDA-1088. Where should I keep my medicine? This drug is given in  a hospital or clinic and will not be stored at home. NOTE: This sheet is a summary. It may not cover all possible information. If you have questions about this medicine, talk to your doctor, pharmacist, or health care provider.  2018 [employer] Standard (2015-07-27 19:58:00)  Carboplatin injection What is this medicine? CARBOPLATIN (KAR boe pla tin) is a chemotherapy drug. It targets fast dividing cells, like cancer cells, and causes these cells to die. This medicine is used to treat ovarian cancer and many other cancers. This medicine may be used for other purposes; ask your health care provider or pharmacist if you have questions. COMMON BRAND NAME(S): Paraplatin What should I tell my health care provider before I take this medicine? They need to know if you have any of these conditions: -blood disorders -hearing problems -kidney disease -recent or ongoing radiation therapy -an unusual or allergic reaction to carboplatin, cisplatin, other chemotherapy, other medicines, foods, dyes, or preservatives -pregnant or trying to get pregnant -breast-feeding How should I use this medicine? This drug is usually given as an infusion into a vein. It is administered in a hospital or clinic by a specially trained health care professional. Talk to your pediatrician regarding the use of this medicine in children. Special care may be needed. Overdosage: If you think you have taken too much of this medicine contact a poison control center or emergency room at once. NOTE: This medicine is only for you. Do not share this medicine with others. What if I miss a dose? It is important not to miss a dose. Call your doctor or health care professional if you are unable to keep an appointment. What may interact with this medicine? -medicines for seizures -medicines to increase blood counts like filgrastim, pegfilgrastim, sargramostim -some antibiotics like amikacin, gentamicin, neomycin, streptomycin,  tobramycin -vaccines Talk to your doctor or health care professional before taking any of these medicines: -acetaminophen -aspirin -ibuprofen -ketoprofen -naproxen This list may not describe all possible interactions. Give your health care provider a list of all the medicines, herbs, non-prescription drugs, or dietary supplements you use. Also tell them if you smoke, drink alcohol, or use illegal drugs. Some items may interact with your medicine. What should I watch for while using this medicine? Your condition will be monitored carefully while you are receiving this medicine. You will need important blood work done while you are taking this medicine. This drug may make you feel generally unwell. This is not uncommon, as chemotherapy can affect healthy cells as well as cancer cells. Report any side effects. Continue your course of treatment even though you feel ill unless your doctor tells you to stop. In some cases, you may be given additional medicines to help with side effects. Follow all directions for their use. Call your doctor or health care professional for advice if you get a fever, chills or sore throat, or other symptoms of a cold or flu. Do not treat yourself. This drug decreases your body's ability to fight infections. Try to avoid being around people who are sick. This medicine may increase your risk to bruise or bleed. Call your doctor or health care professional if you notice any unusual bleeding. Be careful brushing and flossing your teeth or using a toothpick because you may get an infection or bleed more easily. If  you have any dental work done, tell your dentist you are receiving this medicine. Avoid taking products that contain aspirin, acetaminophen, ibuprofen, naproxen, or ketoprofen unless instructed by your doctor. These medicines may hide a fever. Do not become pregnant while taking this medicine. Women should inform their doctor if they wish to become pregnant or think  they might be pregnant. There is a potential for serious side effects to an unborn child. Talk to your health care professional or pharmacist for more information. Do not breast-feed an infant while taking this medicine. What side effects may I notice from receiving this medicine? Side effects that you should report to your doctor or health care professional as soon as possible: -allergic reactions like skin rash, itching or hives, swelling of the face, lips, or tongue -signs of infection - fever or chills, cough, sore throat, pain or difficulty passing urine -signs of decreased platelets or bleeding - bruising, pinpoint red spots on the skin, black, tarry stools, nosebleeds -signs of decreased red blood cells - unusually weak or tired, fainting spells, lightheadedness -breathing problems -changes in hearing -changes in vision -chest pain -high blood pressure -low blood counts - This drug may decrease the number of white blood cells, red blood cells and platelets. You may be at increased risk for infections and bleeding. -nausea and vomiting -pain, swelling, redness or irritation at the injection site -pain, tingling, numbness in the hands or feet -problems with balance, talking, walking -trouble passing urine or change in the amount of urine Side effects that usually do not require medical attention (report to your doctor or health care professional if they continue or are bothersome): -hair loss -loss of appetite -metallic taste in the mouth or changes in taste This list may not describe all possible side effects. Call your doctor for medical advice about side effects. You may report side effects to FDA at 1-800-FDA-1088. Where should I keep my medicine? This drug is given in a hospital or clinic and will not be stored at home. NOTE: This sheet is a summary. It may not cover all possible information. If you have questions about this medicine, talk to your doctor, pharmacist, or health care  provider.  2018 [employer]/Gold Standard (2007-12-31 14:38:05)

## 2018-01-21 ENCOUNTER — Ambulatory Visit
Admission: RE | Admit: 2018-01-21 | Discharge: 2018-01-21 | Disposition: A | Discharge status: Home / Self Care | Payer: No Typology Code available for payment source | Source: Ambulatory Visit | Attending: Radiation Oncology | Admitting: Radiation Oncology

## 2018-01-21 ENCOUNTER — Encounter: Payer: Self-pay | Admitting: Hematology and Oncology

## 2018-01-21 ENCOUNTER — Ambulatory Visit (HOSPITAL_COMMUNITY): Payer: No Typology Code available for payment source

## 2018-01-21 ENCOUNTER — Other Ambulatory Visit (HOSPITAL_COMMUNITY): Payer: Self-pay

## 2018-01-21 DIAGNOSIS — Z510 Encounter for antineoplastic radiation therapy: Secondary | ICD-10-CM | POA: Diagnosis not present

## 2018-01-21 NOTE — Progress Notes (Signed)
[facility] New Visit:  Assessment: Squamous cell carcinoma of base of tongue ([place]) 72 y.o.  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  Agree with the current clinical stage II based on clinical T3, clinical N1 disease.  Based on the age and comorbidities, patient is not a candidate for cisplatin chemotherapy, but will likely be able to tolerate concurrent weekly carboplatin/paclitaxel combination administered with radiotherapy.  Plan: -PEG tube and Infuse-a-Port placement as soon as possible, until then, will administer systemic chemotherapy either through PICC line or through peripheral IV to start as close to initiation of radiotherapy as possible. -Ischemic teaching -Return to clinic on 01/16/18 to initiate systemic treatment.   Voice recognition software was used and creation of this note. Despite my best effort at editing the text, some misspelling/errors may have occurred. Orders Placed This Encounter  Procedures  . CBC with Differential (Cancer Center Only)    Standing Status:   Future    Number of Occurrences:   1    Standing Expiration Date:   01/09/2019  . CMP ([facility] only)    Standing Status:   Future    Number of Occurrences:   1    Standing Expiration Date:   01/09/2019  . Magnesium    Standing Status:   Future    Number of Occurrences:   1    Standing Expiration Date:   01/08/2019  . Phosphorus    Standing Status:   Future    Number of Occurrences:   1    Standing Expiration Date:   01/08/2019    All questions were answered.  . The patient knows to call the clinic with any problems, questions or concerns.  This note was electronically signed.    History of Presenting Illness Edgar Herrera is a 72 y.o. male referred to the the [facility] for diagnosis of squamous cell carcinoma of tongue base, referred by Dr. [name]. Patient initially presented to a [place]  emergency department 11/29/17  with complaints of intermittent dysphasia and hemoptysis episode.  CT scan of the neck obtained at the time demonstrated a large exophytic mass. Patient was seen by Dr. Eugenia Pancoast for at [facility] on 12/10/17 and underwent a fiberoptic laryngoscopy on 12/17/17.  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  Additional staging imaging was obtained with PET/CT demonstrating no evidence of distant metastatic disease, but Lt base of the tongue mass, ipsilateral adenopathy.     Patient was evaluated by [facility] for possible tors procedure and was not found to be a suitable candidate according to Dr. Conley Canal.  Patient was subsequently evaluated by Dr. [name] who has referred the patient to our clinic for possible concurrent multimodality therapy.  Has a past medical history of atrial fibrillation, hypertension, and prostate cancer, but otherwise remains active and healthy.  He is a lifelong non-smoker, does not use alcohol to excess.  Continues to have sore throat.  Currently, patient undergoing preparations for undergoing PEG tube placement and Infuse-a-Port placement at [facility] due to high risk of the procedure in the context of large base of the tongue mass. No recurrent hemoptysis so far.  Oncological/hematological History:   Squamous cell carcinoma of base of tongue ([place])   01/01/2018 Initial Diagnosis    Squamous cell carcinoma of base of tongue ([place])  01/01/2018 Cancer Staging    Staging form: Pharynx - HPV-Mediated Oropharynx, AJCC 8th Edition - Clinical: Stage II (cT3, cN1, cM0, p16+) - Signed by [name], MD on 01/01/2018      01/15/2018 -  Chemotherapy    The patient had palonosetron (ALOXI) injection 0.25 mg, 0.25 mg, Intravenous,  Once, 1 of 7 cycles Administration: 0.25 mg (01/18/2018) CARBOplatin (PARAPLATIN) 230 mg in sodium chloride 0.9 %  250 mL chemo infusion, 230 mg (100 % of original dose 232 mg), Intravenous,  Once, 1 of 7 cycles Dose modification:   (original dose 232 mg, Cycle 1) Administration: 230 mg (01/18/2018) PACLitaxel (TAXOL) 96 mg in sodium chloride 0.9 % 250 mL chemo infusion (</= 39m/m2), 45 mg/m2 = 96 mg, Intravenous,  Once, 1 of 7 cycles Administration: 96 mg (01/18/2018)  for chemotherapy treatment.        Medical History: Past Medical History:  Diagnosis Date  . Allergic rhinitis   . [place]rial fibrillation ([place]   . Diabetes mellitus without complication ([place]   . Hyperlipidemia   . Hypertension   . Prostate cancer ([facility]     Surgical History: Past Surgical History:  Procedure Laterality Date  . KNEE SURGERY Bilateral    2007 and 1998,   . PROSTATECTOMY  12/15/2006  . SKIN GRAFT     as a child left elbow  . TONSILECTOMY/ADENOIDECTOMY WITH MYRINGOTOMY     as a child    Family History: Family History  Problem Relation Age of Onset  . Lung cancer Mother   . Hypertension Mother   . Breast cancer Mother   . Heart attack Father   . Hypertension Sister   . Hypertension Brother   . Hypertension Sister     Social History: Social History   Socioeconomic History  . Marital status: Married    Spouse name: Not on file  . Number of children: Not on file  . Years of education: Not on file  . Highest education level: Not on file  Occupational History  . Not on file  Social Needs  . Financial resource strain: Not on file  . Food insecurity:    Worry: Not on file    Inability: Not on file  . Transportation needs:    Medical: Not on file    Non-medical: Not on file  Tobacco Use  . Smoking status: Never Smoker  . Smokeless tobacco: Never Used  Substance and Sexual Activity  . Alcohol use: Yes    Comment: reports occasional beer or wine. maybe two times monthly  . Drug use: No  . Sexual activity: Not on file  Lifestyle  . Physical activity:    Days per week: Not on file    Minutes  per session: Not on file  . Stress: Not on file  Relationships  . Social connections:    Talks on phone: Not on file    Gets together: Not on file    Attends religious service: Not on file    Active member of club or organization: Not on file    Attends meetings of clubs or organizations: Not on file    Relationship status: Not on file  . Intimate partner violence:    Fear of current or ex partner: Not on file    Emotionally abused: Not on file    Physically abused: Not on file    Forced sexual activity: Not on file  Other Topics Concern  . Not on file  Social History Narrative  .  Not on file    Allergies: No Known Allergies  Medications:  Current Outpatient Medications  Medication Sig Dispense Refill  . allopurinol (ZYLOPRIM) 100 MG tablet Take 100 mg by mouth daily.    Marland Kitchen amLODipine (NORVASC) 10 MG tablet Take 10 mg by mouth daily.    Marland Kitchen apixaban (ELIQUIS) 5 MG TABS tablet Take 5 mg by mouth 2 (two) times daily.    . Artificial Tear Solution (SYSTANE CONTACTS) SOLN Apply 1 drop to eye daily.    . carbamide peroxide (DEBROX) 6.5 % OTIC solution 4 drops 4 (four) times daily.    . cyanocobalamin 500 MCG tablet Take 500 mcg by mouth 2 (two) times daily.    Marland Kitchen dexamethasone (DECADRON) 4 MG tablet Take 2 tablets (8 mg total) by mouth daily. Start the day after chemotherapy for 2 days. 30 tablet 1  . glipiZIDE (GLUCOTROL) 5 MG tablet Take 5 mg by mouth daily before breakfast. Take 1 tablet before breakfast and Take 1/5 tablet before dinner.    . hydrochlorothiazide (HYDRODIURIL) 25 MG tablet Take 12.5 mg by mouth daily.    Marland Kitchen lidocaine-prilocaine (EMLA) cream Apply to affected area once 30 g 3  . lisinopril (PRINIVIL,ZESTRIL) 40 MG tablet Take 40 mg by mouth daily.    Marland Kitchen LORazepam (ATIVAN) 0.5 MG tablet Take 1 tablet (0.5 mg total) by mouth every 6 (six) hours as needed (Nausea or vomiting). 30 tablet 0  . metFORMIN (GLUCOPHAGE) 1000 MG tablet Take 1,000 mg by mouth 2 (two) times daily  with a meal.    . Multiple Vitamins-Minerals (MULTIVITAMIN WITH MINERALS) tablet Take 1 tablet by mouth daily.    . NON FORMULARY Place 0.5 application into both eyes at bedtime. Ocular Lubricant ointment 3.5 gm    . Omega-3 Fatty Acids (FISH OIL) 1000 MG CAPS Take 1,000 mg by mouth 2 (two) times daily.    . ondansetron (ZOFRAN) 8 MG tablet Take 1 tablet (8 mg total) by mouth 2 (two) times daily as needed for refractory nausea / vomiting. Start on day 3 after chemo. 30 tablet 1  . pravastatin (PRAVACHOL) 40 MG tablet Take 40 mg by mouth daily.    . prochlorperazine (COMPAZINE) 10 MG tablet Take 1 tablet (10 mg total) by mouth every 6 (six) hours as needed (Nausea or vomiting). 30 tablet 1  . sodium fluoride (FLUORISHIELD) 1.1 % GEL dental gel Instill one drop of gel per tooth space of fluoride tray. Place over teeth for 5 minutes. Remove. Spit out excess. Repeat nightly. 120 mL prn   No current facility-administered medications for this visit.     Review of Systems: Review of Systems  All other systems reviewed and are negative.    PHYSICAL EXAMINATION Blood pressure (!) 148/74, pulse 81, temperature 98.9 F (37.2 C), temperature source Oral, resp. rate 20, height _0  (1.727 m), weight 215 lb 11.2 oz (97.8 kg), SpO2 97 %.  ECOG PERFORMANCE STATUS: 1 - Symptomatic but completely ambulatory  Physical Exam  Constitutional: He is oriented to person, place, and time. He appears well-developed and well-nourished. No distress.  HENT:  Head: Normocephalic and atraumatic.  Mouth/Throat: Oropharynx is clear and moist. No oropharyngeal exudate.  Eyes: Pupils are equal, round, and reactive to light. Conjunctivae and EOM are normal. No scleral icterus.  Neck: No thyromegaly present.  Cardiovascular: Normal rate, regular rhythm, normal heart sounds and intact distal pulses.  No murmur heard. Pulmonary/Chest: Effort normal and breath sounds normal. No stridor. No respiratory distress. He  has no  wheezes. He has no rales.  Abdominal: Soft. Bowel sounds are normal. He exhibits no distension and no mass. There is no tenderness. There is no guarding.  Musculoskeletal: He exhibits no edema.  Lymphadenopathy:    He has cervical adenopathy.  Neurological: He is alert and oriented to person, place, and time. He displays normal reflexes. No cranial nerve deficit or sensory deficit.  Skin: Skin is warm and dry. No rash noted. He is not diaphoretic. No erythema. No pallor.     LABORATORY DATA: I have personally reviewed the data as listed: Hospital Outpatient Visit on 01/04/2018  Component Date Value Ref Range Status  . BUN 01/04/2018 13  7 - 26 mg/dL Final  . Creatinine 01/04/2018 0.96  0.70 - 1.30 mg/dL Final  . GFR, Est Non Af Am 01/04/2018 >60  >60 mL/min Final  . GFR, Est AFR Am 01/04/2018 >60  >60 mL/min Final   Comment: (NOTE) The eGFR has been calculated using the CKD EPI equation. This calculation has not been validated in all clinical situations. eGFR's persistently <60 mL/min signify possible Chronic Kidney Disease. Performed at [facility], [place] [address], [place], [place] [address]   Hospital Outpatient Visit on 01/02/2018  Component Date Value Ref Range Status  . Magnesium 01/02/2018 2.5  1.5 - 2.5 mg/dL Final   Performed at [facility], [place] [address], [place], [place] [address]  . BUN 01/02/2018 19  7 - 26 mg/dL Final  . Creatinine 01/02/2018 0.97  0.70 - 1.30 mg/dL Final  . GFR, Est Non Af Am 01/02/2018 >60  >60 mL/min Final  . GFR, Est AFR Am 01/02/2018 >60  >60 mL/min Final   Comment: (NOTE) The eGFR has been calculated using the CKD EPI equation. This calculation has not been validated in all clinical situations. eGFR's persistently <60 mL/min signify possible Chronic Kidney Disease. Performed at [facility], [place] [address], [place],  [address]          Ardath Sax, MD

## 2018-01-21 NOTE — Assessment & Plan Note (Signed)
72 y.o.  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  Agree with the current clinical stage II based on clinical T3, clinical N1 disease.  Based on the age and comorbidities, patient is not a candidate for cisplatin chemotherapy, but will likely be able to tolerate concurrent weekly carboplatin/paclitaxel combination administered with radiotherapy.  Plan: -PEG tube and Infuse-a-Port placement as soon as possible, until then, will administer systemic chemotherapy either through PICC line or through peripheral IV to start as close to initiation of radiotherapy as possible. -Ischemic teaching -Return to clinic on 01/16/18 to initiate systemic treatment.

## 2018-01-22 ENCOUNTER — Telehealth: Payer: Self-pay | Admitting: *Deleted

## 2018-01-22 ENCOUNTER — Ambulatory Visit
Admission: RE | Admit: 2018-01-22 | Discharge: 2018-01-22 | Disposition: A | Discharge status: Home / Self Care | Payer: No Typology Code available for payment source | Source: Ambulatory Visit | Attending: Radiation Oncology | Admitting: Radiation Oncology

## 2018-01-22 ENCOUNTER — Encounter: Payer: Self-pay | Admitting: *Deleted

## 2018-01-22 DIAGNOSIS — Z510 Encounter for antineoplastic radiation therapy: Secondary | ICD-10-CM | POA: Diagnosis not present

## 2018-01-22 NOTE — Progress Notes (Signed)
Oncology Nurse Navigator Documentation  Met with Mr. Carothers s/p weekly PUT with Dr. Sondra Come. Provided him updated Epic appt calendar, noted Friday's appts have been rescheduled to Thursday to accommodate PAC/PEG placement at [facility] Friday morning. Per discussion with RTT Lead Alm Bustard, his Friday morning RT has been changed to 2:00 pending expected 12:00 DC.  He will call me to update DC status should appt time need to be adjusted.  Gayleen Orem, RN, BSN Head & Neck Oncology Nurse [place] at [place] [phone]

## 2018-01-23 ENCOUNTER — Ambulatory Visit
Admission: RE | Admit: 2018-01-23 | Discharge: 2018-01-23 | Disposition: A | Discharge status: Home / Self Care | Payer: No Typology Code available for payment source | Source: Ambulatory Visit | Attending: Radiation Oncology | Admitting: Radiation Oncology

## 2018-01-23 DIAGNOSIS — Z510 Encounter for antineoplastic radiation therapy: Secondary | ICD-10-CM | POA: Diagnosis not present

## 2018-01-23 NOTE — Telephone Encounter (Signed)
Oncology Nurse Navigator Documentation  In follow-up to request from [place], faxed MedOnc Dr. Clydene Laming 5/2 Progress Note documenting his initial consult with patient.  Gayleen Orem, RN, BSN Head & Neck Oncology Nurse [place] at [place] [phone]

## 2018-01-24 ENCOUNTER — Inpatient Hospital Stay: Payer: No Typology Code available for payment source

## 2018-01-24 ENCOUNTER — Encounter: Payer: Self-pay | Admitting: Hematology and Oncology

## 2018-01-24 ENCOUNTER — Ambulatory Visit
Admission: RE | Admit: 2018-01-24 | Discharge: 2018-01-24 | Disposition: A | Discharge status: Home / Self Care | Payer: No Typology Code available for payment source | Source: Ambulatory Visit | Attending: Radiation Oncology | Admitting: Radiation Oncology

## 2018-01-24 ENCOUNTER — Other Ambulatory Visit: Payer: Self-pay

## 2018-01-24 ENCOUNTER — Telehealth: Payer: Self-pay | Admitting: Hematology and Oncology

## 2018-01-24 ENCOUNTER — Inpatient Hospital Stay (HOSPITAL_BASED_OUTPATIENT_CLINIC_OR_DEPARTMENT_OTHER): Payer: No Typology Code available for payment source | Admitting: Hematology and Oncology

## 2018-01-24 ENCOUNTER — Inpatient Hospital Stay: Payer: No Typology Code available for payment source | Admitting: Nutrition

## 2018-01-24 VITALS — BP 125/78 | HR 86 | Temp 98.0°F | Resp 17 | Ht 68.0 in | Wt 210.3 lb

## 2018-01-24 DIAGNOSIS — Z5111 Encounter for antineoplastic chemotherapy: Secondary | ICD-10-CM

## 2018-01-24 DIAGNOSIS — Z510 Encounter for antineoplastic radiation therapy: Secondary | ICD-10-CM | POA: Diagnosis not present

## 2018-01-24 DIAGNOSIS — C01 Malignant neoplasm of base of tongue: Secondary | ICD-10-CM | POA: Diagnosis not present

## 2018-01-24 DIAGNOSIS — E119 Type 2 diabetes mellitus without complications: Secondary | ICD-10-CM | POA: Diagnosis not present

## 2018-01-24 LAB — CBC WITH DIFFERENTIAL (CANCER CENTER ONLY)
Basophils Absolute: 0 10*3/uL (ref 0.0–0.1)
Basophils Relative: 1 %
Eosinophils Absolute: 0.1 10*3/uL (ref 0.0–0.5)
Eosinophils Relative: 2 %
HEMATOCRIT: 42.9 % (ref 38.4–49.9)
HEMOGLOBIN: 14.1 g/dL (ref 13.0–17.1)
LYMPHS ABS: 1 10*3/uL (ref 0.9–3.3)
LYMPHS PCT: 16 %
MCH: 28.2 pg (ref 27.2–33.4)
MCHC: 32.8 g/dL (ref 32.0–36.0)
MCV: 85.9 fL (ref 79.3–98.0)
MONOS PCT: 6 %
Monocytes Absolute: 0.4 10*3/uL (ref 0.1–0.9)
NEUTROS ABS: 4.9 10*3/uL (ref 1.5–6.5)
NEUTROS PCT: 75 %
Platelet Count: 197 10*3/uL (ref 140–400)
RBC: 4.99 MIL/uL (ref 4.20–5.82)
RDW: 13.9 % (ref 11.0–14.6)
WBC Count: 6.4 10*3/uL (ref 4.0–10.3)

## 2018-01-24 LAB — CMP (CANCER CENTER ONLY)
ALK PHOS: 76 U/L (ref 40–150)
ALT: 29 U/L (ref 0–55)
ANION GAP: 8 (ref 3–11)
AST: 15 U/L (ref 5–34)
Albumin: 3.2 g/dL — ABNORMAL LOW (ref 3.5–5.0)
BILIRUBIN TOTAL: 0.5 mg/dL (ref 0.2–1.2)
BUN: 21 mg/dL (ref 7–26)
CALCIUM: 9.1 mg/dL (ref 8.4–10.4)
CO2: 27 mmol/L (ref 22–29)
CREATININE: 0.92 mg/dL (ref 0.70–1.30)
Chloride: 102 mmol/L (ref 98–109)
GFR, Estimated: >60 mL/min (ref >60)
Glucose, Bld: 220 mg/dL — ABNORMAL HIGH (ref 70–140)
Potassium: 3.9 mmol/L (ref 3.5–5.1)
Sodium: 137 mmol/L (ref 136–145)
TOTAL PROTEIN: 6.7 g/dL (ref 6.4–8.3)

## 2018-01-24 LAB — MAGNESIUM: Magnesium: 1.7 mg/dL (ref 1.7–2.4)

## 2018-01-24 LAB — PHOSPHORUS: PHOSPHORUS: 3 mg/dL (ref 2.5–4.6)

## 2018-01-24 MED ORDER — SODIUM CHLORIDE 0.9 % IV SOLN
Freq: Once | INTRAVENOUS | Status: AC
  Administered 2018-01-24: 13:00:00 via INTRAVENOUS

## 2018-01-24 MED ORDER — FAMOTIDINE IN NACL 20-0.9 MG/50ML-% IV SOLN
20.0000 mg | Freq: Once | INTRAVENOUS | Status: AC
  Administered 2018-01-24: 20 mg via INTRAVENOUS

## 2018-01-24 MED ORDER — DIPHENHYDRAMINE HCL 50 MG/ML IJ SOLN
50.0000 mg | Freq: Once | INTRAMUSCULAR | Status: AC
  Administered 2018-01-24: 50 mg via INTRAVENOUS

## 2018-01-24 MED ORDER — SODIUM CHLORIDE 0.9 % IV SOLN
45.0000 mg/m2 | Freq: Once | INTRAVENOUS | Status: AC
  Administered 2018-01-24: 96 mg via INTRAVENOUS
  Filled 2018-01-24: qty 16

## 2018-01-24 MED ORDER — PALONOSETRON HCL INJECTION 0.25 MG/5ML
INTRAVENOUS | Status: AC
  Filled 2018-01-24: qty 5

## 2018-01-24 MED ORDER — SODIUM CHLORIDE 0.9 % IV SOLN
20.0000 mg | Freq: Once | INTRAVENOUS | Status: AC
  Administered 2018-01-24: 20 mg via INTRAVENOUS
  Filled 2018-01-24: qty 2

## 2018-01-24 MED ORDER — FAMOTIDINE IN NACL 20-0.9 MG/50ML-% IV SOLN
INTRAVENOUS | Status: AC
  Filled 2018-01-24: qty 50

## 2018-01-24 MED ORDER — SODIUM CHLORIDE 0.9 % IV SOLN
232.0000 mg | Freq: Once | INTRAVENOUS | Status: AC
  Administered 2018-01-24: 230 mg via INTRAVENOUS
  Filled 2018-01-24: qty 23

## 2018-01-24 MED ORDER — PALONOSETRON HCL INJECTION 0.25 MG/5ML
0.2500 mg | Freq: Once | INTRAVENOUS | Status: AC
  Administered 2018-01-24: 0.25 mg via INTRAVENOUS

## 2018-01-24 MED ORDER — DIPHENHYDRAMINE HCL 50 MG/ML IJ SOLN
INTRAMUSCULAR | Status: AC
  Filled 2018-01-24: qty 1

## 2018-01-24 NOTE — Patient Instructions (Signed)
[facility] Discharge Instructions for Patients Receiving Chemotherapy  Today you received the following chemotherapy agents Taxol and Carboplatin   To help prevent nausea and vomiting after your treatment, we encourage you to take your nausea medication as directed. No Zofran for 3 days. Take Compazine instead.    If you develop nausea and vomiting that is not controlled by your nausea medication, call the clinic.   BELOW ARE SYMPTOMS THAT SHOULD BE REPORTED IMMEDIATELY:  *FEVER GREATER THAN 100.5 F  *CHILLS WITH OR WITHOUT FEVER  NAUSEA AND VOMITING THAT IS NOT CONTROLLED WITH YOUR NAUSEA MEDICATION  *UNUSUAL SHORTNESS OF BREATH  *UNUSUAL BRUISING OR BLEEDING  TENDERNESS IN MOUTH AND THROAT WITH OR WITHOUT PRESENCE OF ULCERS  *URINARY PROBLEMS  *BOWEL PROBLEMS  UNUSUAL RASH Items with * indicate a potential emergency and should be followed up as soon as possible.  Feel free to call the clinic should you have any questions or concerns. The clinic phone number is [phone].  Please show the CHEMO ALERT CARD at check-in to the Emergency Department and triage nurse.   

## 2018-01-24 NOTE — Progress Notes (Signed)
Nutrition follow-up completed with patient during infusion for tongue cancer. Weight was documented at 210.3 pounds on April 18. Patient states he is able to eat anything that he likes. He will have his feeding tube placed tomorrow. He has no questions or concerns at this time.  Nutrition diagnosis: Predicted suboptimal energy intake continues.  Intervention: Educated patient to continue increased calories and protein to minimize weight loss. I will continue to work with patient to begin tube feeding when oral intake declines and patient has weight loss.  Monitoring, evaluation, goals: Patient will tolerate calories and protein to minimize weight loss.  Next visit: Friday, May 3 during infusion.  **Disclaimer: This note was dictated with voice recognition software. Similar sounding words can inadvertently be transcribed and this note may contain transcription errors which may not have been corrected upon publication of note.**

## 2018-01-24 NOTE — Telephone Encounter (Signed)
appts already scheduled per 4/18 los.

## 2018-01-25 ENCOUNTER — Ambulatory Visit: Payer: Self-pay

## 2018-01-25 ENCOUNTER — Encounter: Payer: Self-pay | Admitting: Nutrition

## 2018-01-25 ENCOUNTER — Ambulatory Visit: Payer: No Typology Code available for payment source

## 2018-01-25 ENCOUNTER — Other Ambulatory Visit: Payer: Self-pay

## 2018-01-25 ENCOUNTER — Ambulatory Visit: Payer: Self-pay | Admitting: Hematology and Oncology

## 2018-01-28 ENCOUNTER — Ambulatory Visit
Admission: RE | Admit: 2018-01-28 | Discharge: 2018-01-28 | Disposition: A | Discharge status: Home / Self Care | Payer: No Typology Code available for payment source | Source: Ambulatory Visit | Attending: Radiation Oncology | Admitting: Radiation Oncology

## 2018-01-28 ENCOUNTER — Encounter: Payer: Self-pay | Admitting: Radiation Oncology

## 2018-01-28 DIAGNOSIS — Z510 Encounter for antineoplastic radiation therapy: Secondary | ICD-10-CM | POA: Diagnosis not present

## 2018-01-29 ENCOUNTER — Ambulatory Visit
Admission: RE | Admit: 2018-01-29 | Discharge: 2018-01-29 | Disposition: A | Discharge status: Home / Self Care | Payer: No Typology Code available for payment source | Source: Ambulatory Visit | Attending: Radiation Oncology | Admitting: Radiation Oncology

## 2018-01-29 ENCOUNTER — Other Ambulatory Visit: Payer: Self-pay | Admitting: Radiation Oncology

## 2018-01-29 DIAGNOSIS — Z510 Encounter for antineoplastic radiation therapy: Secondary | ICD-10-CM | POA: Diagnosis not present

## 2018-01-29 DIAGNOSIS — C01 Malignant neoplasm of base of tongue: Secondary | ICD-10-CM

## 2018-01-29 MED ORDER — LIDOCAINE VISCOUS 2 % MT SOLN: OROMUCOSAL | 5 refills | Status: DC

## 2018-01-30 ENCOUNTER — Ambulatory Visit
Admission: RE | Admit: 2018-01-30 | Discharge: 2018-01-30 | Disposition: A | Discharge status: Home / Self Care | Payer: No Typology Code available for payment source | Source: Ambulatory Visit | Attending: Radiation Oncology | Admitting: Radiation Oncology

## 2018-01-30 DIAGNOSIS — Z510 Encounter for antineoplastic radiation therapy: Secondary | ICD-10-CM | POA: Diagnosis not present

## 2018-01-31 ENCOUNTER — Ambulatory Visit
Admission: RE | Admit: 2018-01-31 | Discharge: 2018-01-31 | Disposition: A | Discharge status: Home / Self Care | Payer: No Typology Code available for payment source | Source: Ambulatory Visit | Attending: Radiation Oncology | Admitting: Radiation Oncology

## 2018-01-31 DIAGNOSIS — Z510 Encounter for antineoplastic radiation therapy: Secondary | ICD-10-CM | POA: Diagnosis not present

## 2018-02-01 ENCOUNTER — Inpatient Hospital Stay: Payer: No Typology Code available for payment source

## 2018-02-01 ENCOUNTER — Encounter: Payer: Self-pay | Admitting: Hematology and Oncology

## 2018-02-01 ENCOUNTER — Telehealth: Payer: Self-pay

## 2018-02-01 ENCOUNTER — Ambulatory Visit
Admission: RE | Admit: 2018-02-01 | Discharge: 2018-02-01 | Disposition: A | Discharge status: Home / Self Care | Payer: No Typology Code available for payment source | Source: Ambulatory Visit | Attending: Radiation Oncology | Admitting: Radiation Oncology

## 2018-02-01 ENCOUNTER — Encounter: Payer: Self-pay | Admitting: Radiation Oncology

## 2018-02-01 ENCOUNTER — Inpatient Hospital Stay (HOSPITAL_BASED_OUTPATIENT_CLINIC_OR_DEPARTMENT_OTHER): Payer: No Typology Code available for payment source | Admitting: Hematology and Oncology

## 2018-02-01 VITALS — BP 115/65 | HR 78 | Temp 98.5°F | Resp 18 | Ht 68.0 in | Wt 206.5 lb

## 2018-02-01 DIAGNOSIS — C01 Malignant neoplasm of base of tongue: Secondary | ICD-10-CM | POA: Diagnosis not present

## 2018-02-01 DIAGNOSIS — Z510 Encounter for antineoplastic radiation therapy: Secondary | ICD-10-CM | POA: Diagnosis not present

## 2018-02-01 DIAGNOSIS — Z5111 Encounter for antineoplastic chemotherapy: Secondary | ICD-10-CM

## 2018-02-01 DIAGNOSIS — E119 Type 2 diabetes mellitus without complications: Secondary | ICD-10-CM

## 2018-02-01 LAB — CBC WITH DIFFERENTIAL/PLATELET
Basophils Absolute: 0 10*3/uL (ref 0.0–0.1)
Basophils Relative: 1 %
EOS PCT: 1 %
Eosinophils Absolute: 0 10*3/uL (ref 0.0–0.5)
HCT: 37.9 % — ABNORMAL LOW (ref 38.4–49.9)
Hemoglobin: 12.5 g/dL — ABNORMAL LOW (ref 13.0–17.1)
LYMPHS ABS: 0.5 10*3/uL — AB (ref 0.9–3.3)
Lymphocytes Relative: 13 %
MCH: 28.4 pg (ref 27.2–33.4)
MCHC: 33 g/dL (ref 32.0–36.0)
MCV: 86 fL (ref 79.3–98.0)
MONO ABS: 0.3 10*3/uL (ref 0.1–0.9)
MONOS PCT: 9 %
Neutro Abs: 2.8 10*3/uL (ref 1.5–6.5)
Neutrophils Relative %: 76 %
PLATELETS: 174 10*3/uL (ref 140–400)
RBC: 4.41 MIL/uL (ref 4.20–5.82)
RDW: 13.6 % (ref 11.0–14.6)
WBC: 3.6 10*3/uL — ABNORMAL LOW (ref 4.0–10.3)

## 2018-02-01 LAB — COMPREHENSIVE METABOLIC PANEL
ALBUMIN: 3 g/dL — AB (ref 3.5–5.0)
ALT: 24 U/L (ref 0–55)
AST: 18 U/L (ref 5–34)
Alkaline Phosphatase: 71 U/L (ref 40–150)
Anion gap: 9 (ref 3–11)
BUN: 15 mg/dL (ref 7–26)
CHLORIDE: 101 mmol/L (ref 98–109)
CO2: 28 mmol/L (ref 22–29)
CREATININE: 0.95 mg/dL (ref 0.70–1.30)
Calcium: 9.1 mg/dL (ref 8.4–10.4)
GFR calc Af Amer: >60 mL/min (ref >60)
GLUCOSE: 162 mg/dL — AB (ref 70–140)
Potassium: 3.4 mmol/L — ABNORMAL LOW (ref 3.5–5.1)
Sodium: 138 mmol/L (ref 136–145)
Total Bilirubin: 0.4 mg/dL (ref 0.2–1.2)
Total Protein: 6.5 g/dL (ref 6.4–8.3)

## 2018-02-01 LAB — TSH: TSH: 0.557 u[IU]/mL (ref 0.320–4.118)

## 2018-02-01 MED ORDER — SODIUM CHLORIDE 0.9% FLUSH
10.0000 mL | INTRAVENOUS | Status: DC | PRN
  Administered 2018-02-01: 10 mL
  Filled 2018-02-01: qty 10

## 2018-02-01 MED ORDER — SODIUM CHLORIDE 0.9 % IV SOLN
Freq: Once | INTRAVENOUS | Status: AC
  Administered 2018-02-01: 13:00:00 via INTRAVENOUS

## 2018-02-01 MED ORDER — CARBOPLATIN CHEMO INJECTION 450 MG/45ML
232.0000 mg | Freq: Once | INTRAVENOUS | Status: AC
  Administered 2018-02-01: 230 mg via INTRAVENOUS
  Filled 2018-02-01: qty 23

## 2018-02-01 MED ORDER — DIPHENHYDRAMINE HCL 50 MG/ML IJ SOLN
50.0000 mg | Freq: Once | INTRAMUSCULAR | Status: AC
  Administered 2018-02-01: 50 mg via INTRAVENOUS

## 2018-02-01 MED ORDER — SODIUM CHLORIDE 0.9 % IV SOLN
45.0000 mg/m2 | Freq: Once | INTRAVENOUS | Status: AC
  Administered 2018-02-01: 96 mg via INTRAVENOUS
  Filled 2018-02-01: qty 16

## 2018-02-01 MED ORDER — PALONOSETRON HCL INJECTION 0.25 MG/5ML
0.2500 mg | Freq: Once | INTRAVENOUS | Status: AC
  Administered 2018-02-01: 0.25 mg via INTRAVENOUS

## 2018-02-01 MED ORDER — HEPARIN SOD (PORK) LOCK FLUSH 100 UNIT/ML IV SOLN
500.0000 [IU] | Freq: Once | INTRAVENOUS | Status: AC | PRN
Start: 2018-02-01 — End: 2018-02-01
  Administered 2018-02-01: 500 [IU]
  Filled 2018-02-01: qty 5

## 2018-02-01 MED ORDER — FAMOTIDINE IN NACL 20-0.9 MG/50ML-% IV SOLN
20.0000 mg | Freq: Once | INTRAVENOUS | Status: AC
  Administered 2018-02-01: 20 mg via INTRAVENOUS

## 2018-02-01 MED ORDER — DEXAMETHASONE SODIUM PHOSPHATE 100 MG/10ML IJ SOLN
20.0000 mg | Freq: Once | INTRAMUSCULAR | Status: AC
  Administered 2018-02-01: 20 mg via INTRAVENOUS
  Filled 2018-02-01: qty 2

## 2018-02-01 NOTE — Telephone Encounter (Signed)
Printed avs and calender of upcoming appointment (already completed) per 4/26 los

## 2018-02-01 NOTE — Patient Instructions (Signed)
[facility] Discharge Instructions for Patients Receiving Chemotherapy  Today you received the following chemotherapy agents:  Taxol, Carboplatin  To help prevent nausea and vomiting after your treatment, we encourage you to take your nausea medication as prescribed.   If you develop nausea and vomiting that is not controlled by your nausea medication, call the clinic.   BELOW ARE SYMPTOMS THAT SHOULD BE REPORTED IMMEDIATELY:  *FEVER GREATER THAN 100.5 F  *CHILLS WITH OR WITHOUT FEVER  NAUSEA AND VOMITING THAT IS NOT CONTROLLED WITH YOUR NAUSEA MEDICATION  *UNUSUAL SHORTNESS OF BREATH  *UNUSUAL BRUISING OR BLEEDING  TENDERNESS IN MOUTH AND THROAT WITH OR WITHOUT PRESENCE OF ULCERS  *URINARY PROBLEMS  *BOWEL PROBLEMS  UNUSUAL RASH Items with * indicate a potential emergency and should be followed up as soon as possible.  Feel free to call the clinic should you have any questions or concerns. The clinic phone number is [phone].  Please show the CHEMO ALERT CARD at check-in to the Emergency Department and triage nurse.   

## 2018-02-04 ENCOUNTER — Encounter (HOSPITAL_COMMUNITY): Payer: Self-pay | Admitting: Dentistry

## 2018-02-04 ENCOUNTER — Encounter (HOSPITAL_COMMUNITY): Payer: Self-pay

## 2018-02-04 ENCOUNTER — Ambulatory Visit
Admission: RE | Admit: 2018-02-04 | Discharge: 2018-02-04 | Disposition: A | Discharge status: Home / Self Care | Payer: No Typology Code available for payment source | Source: Ambulatory Visit | Attending: Radiation Oncology | Admitting: Radiation Oncology

## 2018-02-04 ENCOUNTER — Ambulatory Visit (HOSPITAL_COMMUNITY): Payer: Medicaid - Dental | Admitting: Dentistry

## 2018-02-04 VITALS — BP 126/64 | HR 79 | Temp 98.2°F | Wt 206.0 lb

## 2018-02-04 DIAGNOSIS — Z85828 Personal history of other malignant neoplasm of skin: Secondary | ICD-10-CM | POA: Diagnosis not present

## 2018-02-04 DIAGNOSIS — R1310 Dysphagia, unspecified: Secondary | ICD-10-CM

## 2018-02-04 DIAGNOSIS — N3001 Acute cystitis with hematuria: Secondary | ICD-10-CM | POA: Insufficient documentation

## 2018-02-04 DIAGNOSIS — K117 Disturbances of salivary secretion: Secondary | ICD-10-CM

## 2018-02-04 DIAGNOSIS — R432 Parageusia: Secondary | ICD-10-CM

## 2018-02-04 DIAGNOSIS — R682 Dry mouth, unspecified: Secondary | ICD-10-CM

## 2018-02-04 DIAGNOSIS — I10 Essential (primary) hypertension: Secondary | ICD-10-CM | POA: Insufficient documentation

## 2018-02-04 DIAGNOSIS — Z7901 Long term (current) use of anticoagulants: Secondary | ICD-10-CM | POA: Diagnosis not present

## 2018-02-04 DIAGNOSIS — K1233 Oral mucositis (ulcerative) due to radiation: Secondary | ICD-10-CM

## 2018-02-04 DIAGNOSIS — K1230 Oral mucositis (ulcerative), unspecified: Secondary | ICD-10-CM | POA: Diagnosis not present

## 2018-02-04 DIAGNOSIS — Z79899 Other long term (current) drug therapy: Secondary | ICD-10-CM | POA: Diagnosis not present

## 2018-02-04 DIAGNOSIS — R509 Fever, unspecified: Secondary | ICD-10-CM | POA: Diagnosis present

## 2018-02-04 DIAGNOSIS — K062 Gingival and edentulous alveolar ridge lesions associated with trauma: Secondary | ICD-10-CM

## 2018-02-04 DIAGNOSIS — Z7984 Long term (current) use of oral hypoglycemic drugs: Secondary | ICD-10-CM | POA: Insufficient documentation

## 2018-02-04 DIAGNOSIS — Z510 Encounter for antineoplastic radiation therapy: Secondary | ICD-10-CM | POA: Diagnosis not present

## 2018-02-04 DIAGNOSIS — C01 Malignant neoplasm of base of tongue: Secondary | ICD-10-CM

## 2018-02-04 DIAGNOSIS — Z8546 Personal history of malignant neoplasm of prostate: Secondary | ICD-10-CM | POA: Insufficient documentation

## 2018-02-04 DIAGNOSIS — E119 Type 2 diabetes mellitus without complications: Secondary | ICD-10-CM | POA: Diagnosis not present

## 2018-02-04 NOTE — Patient Instructions (Signed)
RECOMMENDATIONS: 1. Brush after meals and at bedtime.  Use fluoride at bedtime. 2. Use trismus exercises as directed. 3. Use Biotene Rinse or salt water/baking soda rinses. 4. Multiple sips of water as needed. 5. Return to clinic in two months for oral exam after chemoradiation therapy. Call if problems before then.  Lenn Cal, DDS

## 2018-02-04 NOTE — Progress Notes (Signed)
02/04/2018  Patient Name:   Edgar Herrera Date of Birth:   1946/07/24 Medical Record Number: [id number]  BP 126/64 (BP Location: Right Arm)   Pulse 79   Temp 98.2 F (36.8 C)   Wt 206 lb (93.4 kg)   BMI 31.32 kg/m   [name] presents for oral examination during chemoradiation therapy. Patient has completed 13/35 radiation treatments.  Patient has had 3 of 7 chemotherapy treatments.   REVIEW OF CHIEF COMPLAINTS:  DRY MOUTH:  Yes HARD TO SWALLOW:   Yes  HURT TO SWALLOW: yes, at times TASTE CHANGES: some taste remains but not much. SORES IN MOUTH: yes TRISMUS: no problems with trismus symptoms WEIGHT: patient weighs 206 pounds down from 212 pounds at start of treatment.  HOME OH REGIMEN:  BRUSHING: twice a day FLOSSING: once a day RINSING: rinsing with Biotene twice, but burns at times. FLUORIDE: using fluoride at bedtime TRISMUS EXERCISES:  Maximum interincisal opening: 30 mm down from 40 mm. Patient is encouraged to use trismus exercises to maintain distance of opening.   DENTAL EXAM:  Oral Hygiene:(PLAQUE): Some paque noted. Oral hygiene instruction was provided. LOCATION OF MUCOSITIS: Back of throat and left retromolar trigone area. DESCRIPTION OF SALIVA: Decreased saliva. Foamy saliva. ANY EXPOSED [place]: None noted. OTHER WATCHED AREAS: Tooth #17. Decision made by patient not to have tooth #17 extracted. Dr. Isidore Moos indicated she would keep the dose below 4500 cGy in that area. DX: Xerostomia, Dysgeusia, Dysphagia, Odynophagia, Weight Loss, Accretions and Mucositis  RECOMMENDATIONS: 1. Brush after meals and at bedtime.  Use fluoride at bedtime. 2. Use trismus exercises as directed. 3. Use Biotene Rinse or salt water/baking soda rinses. 4. Multiple sips of water as needed. 5. Return to clinic in two months for oral exam after chemoradiation therapy. Call if problems before then.  Lenn Cal, DDS

## 2018-02-05 ENCOUNTER — Other Ambulatory Visit: Payer: Self-pay

## 2018-02-05 ENCOUNTER — Emergency Department (HOSPITAL_COMMUNITY)
Admission: EM | Admit: 2018-02-05 | Discharge: 2018-02-05 | Disposition: A | Discharge status: Home / Self Care | Payer: Non-veteran care | Attending: Emergency Medicine | Admitting: Emergency Medicine

## 2018-02-05 ENCOUNTER — Ambulatory Visit
Admission: RE | Admit: 2018-02-05 | Discharge: 2018-02-05 | Disposition: A | Discharge status: Home / Self Care | Payer: No Typology Code available for payment source | Source: Ambulatory Visit | Attending: Radiation Oncology | Admitting: Radiation Oncology

## 2018-02-05 DIAGNOSIS — Z510 Encounter for antineoplastic radiation therapy: Secondary | ICD-10-CM | POA: Diagnosis not present

## 2018-02-05 DIAGNOSIS — C01 Malignant neoplasm of base of tongue: Secondary | ICD-10-CM

## 2018-02-05 DIAGNOSIS — N3001 Acute cystitis with hematuria: Secondary | ICD-10-CM

## 2018-02-05 DIAGNOSIS — R509 Fever, unspecified: Secondary | ICD-10-CM

## 2018-02-05 LAB — CBC WITH DIFFERENTIAL/PLATELET

## 2018-02-05 LAB — COMPREHENSIVE METABOLIC PANEL
ALBUMIN: 2.8 g/dL — AB (ref 3.5–5.0)
ALT: 19 U/L (ref 17–63)
ANION GAP: 11 (ref 5–15)
AST: 15 U/L (ref 15–41)
Alkaline Phosphatase: 54 U/L (ref 38–126)
BILIRUBIN TOTAL: 0.6 mg/dL (ref 0.3–1.2)
BUN: 12 mg/dL (ref 6–20)
CHLORIDE: 100 mmol/L — AB (ref 101–111)
CO2: 26 mmol/L (ref 22–32)
Calcium: 8.3 mg/dL — ABNORMAL LOW (ref 8.9–10.3)
Creatinine, Ser: 0.74 mg/dL (ref 0.61–1.24)
GFR calc Af Amer: >60 mL/min (ref >60)
GFR calc non Af Amer: >60 mL/min (ref >60)
GLUCOSE: 114 mg/dL — AB (ref 65–99)
POTASSIUM: 3.3 mmol/L — AB (ref 3.5–5.1)
SODIUM: 137 mmol/L (ref 135–145)
TOTAL PROTEIN: 5.9 g/dL — AB (ref 6.5–8.1)

## 2018-02-05 LAB — TYPE AND SCREEN
ABO/RH(D): A POS
ABO/RH(D): A POS
ANTIBODY SCREEN: NEGATIVE
Antibody Screen: NEGATIVE

## 2018-02-05 LAB — CBC
HEMATOCRIT: 32.3 % — AB (ref 39.0–52.0)
Hemoglobin: 10.8 g/dL — ABNORMAL LOW (ref 13.0–17.0)
MCH: 28.9 pg (ref 26.0–34.0)
MCHC: 33.4 g/dL (ref 30.0–36.0)
MCV: 86.4 fL (ref 78.0–100.0)
PLATELETS: 142 10*3/uL — AB (ref 150–400)
RBC: 3.74 MIL/uL — ABNORMAL LOW (ref 4.22–5.81)
RDW: 13.3 % (ref 11.5–15.5)
WBC: 4.2 10*3/uL (ref 4.0–10.5)

## 2018-02-05 LAB — URINALYSIS, ROUTINE W REFLEX MICROSCOPIC
BACTERIA UA: NONE SEEN
BILIRUBIN URINE: NEGATIVE
GLUCOSE, UA: NEGATIVE mg/dL
Ketones, ur: NEGATIVE mg/dL
LEUKOCYTES UA: NEGATIVE
NITRITE: NEGATIVE
Protein, ur: NEGATIVE mg/dL
SPECIFIC GRAVITY, URINE: 1.017 (ref 1.005–1.030)
pH: 5 (ref 5.0–8.0)

## 2018-02-05 LAB — DIFFERENTIAL
Basophils Absolute: 0 10*3/uL (ref 0.0–0.1)
Basophils Relative: 0 %
EOS PCT: 0 %
Eosinophils Absolute: 0 10*3/uL (ref 0.0–0.7)
LYMPHS PCT: 13 %
Lymphs Abs: 0.5 10*3/uL — ABNORMAL LOW (ref 0.7–4.0)
MONO ABS: 0.3 10*3/uL (ref 0.1–1.0)
MONOS PCT: 7 %
NEUTROS ABS: 3.4 10*3/uL (ref 1.7–7.7)
Neutrophils Relative %: 80 %

## 2018-02-05 LAB — ABO/RH: ABO/RH(D): A POS

## 2018-02-05 MED ORDER — HEPARIN SOD (PORK) LOCK FLUSH 100 UNIT/ML IV SOLN
500.0000 [IU] | Freq: Once | INTRAVENOUS | Status: AC
  Administered 2018-02-05: 500 [IU] via INTRAVENOUS

## 2018-02-05 MED ORDER — SODIUM CHLORIDE 0.9 % IV SOLN
1.0000 g | Freq: Once | INTRAVENOUS | Status: AC
  Administered 2018-02-05: 1 g via INTRAVENOUS
  Filled 2018-02-05: qty 10

## 2018-02-05 MED ORDER — SODIUM CHLORIDE 0.9% FLUSH
10.0000 mL | Freq: Once | INTRAVENOUS | Status: AC
  Administered 2018-02-05: 10 mL via INTRAVENOUS

## 2018-02-05 MED ORDER — CEPHALEXIN 500 MG PO CAPS: 500.0000 mg | ORAL_CAPSULE | Freq: Three times a day (TID) | ORAL | 0 refills | Status: DC

## 2018-02-05 NOTE — ED Notes (Signed)
Pt reports having burning and pain with urination for the last day and last chemo treatment was on 02/01/18 and is to have radiation treatment in the morning. Pt states he began running a fever before bed and the cancer nurse advised to come to the ED for further evaluation.

## 2018-02-05 NOTE — ED Triage Notes (Signed)
Pt complains of a fever this afternoon, he had radiation today and chemo on Friday Pt also states that he had burning when he urinates and had a spot of blood in his urine

## 2018-02-05 NOTE — ED Provider Notes (Addendum)
[name] DEPT Provider Note   CSN: [id number] Arrival date & time: 02/04/18  2226     History   Chief Complaint Chief Complaint  Patient presents with  . Fever    HPI Edgar Herrera is a 72 y.o. male.  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  Reports fever at home to 100.7.  Yesterday he noted some dysuria and hematuria.  Denies any back pain, abdominal pain, nausea, vomiting.  Reports that their oncologist directed them here for further evaluation.  He is due for radiation treatment tomorrow.  He denies any upper respiratory symptoms including cough, congestion, shortness of breath, chest pain.  Past Medical History:  Diagnosis Date  . Allergic rhinitis   . Atrial fibrillation ([place])   . Diabetes mellitus without complication ([place])   . Hyperlipidemia   . Hypertension   . Prostate cancer [facility])     Patient Active Problem List   Diagnosis Date Noted  . Squamous cell carcinoma of base of tongue ([place]) 01/01/2018  . Atrial flutter ([place]) 05/14/2017  . Chronic anticoagulation 05/14/2017  . Essential hypertension 05/14/2017  . Paroxysmal atrial fibrillation ([place]) 05/13/2017    Past Surgical History:  Procedure Laterality Date  . KNEE SURGERY Bilateral    2007 and 1998,   . PROSTATECTOMY  12/15/2006  . SKIN GRAFT     as a child left elbow  . TONSILECTOMY/ADENOIDECTOMY WITH MYRINGOTOMY     as a child        Home Medications    Prior to Admission medications   Medication Sig Start Date End Date Taking? Authorizing Provider  allopurinol (ZYLOPRIM) 100 MG tablet Take 100 mg by mouth daily.   Yes [provider]  amLODipine (NORVASC) 10 MG tablet Take 10 mg by mouth daily.   Yes [provider]  apixaban (ELIQUIS) 5 MG TABS tablet Take 5 mg by mouth 2  (two) times daily.   Yes [provider]  Artificial Tear Solution (SYSTANE CONTACTS) SOLN Apply 1 drop to eye daily.   Yes [provider]  cyanocobalamin 500 MCG tablet Take 500 mcg by mouth 2 (two) times daily.   Yes [provider]  glipiZIDE (GLUCOTROL) 5 MG tablet Take 2.5-5 mg by mouth 2 (two) times daily before a meal. Take 1 tablet before breakfast and Take 1/2 tablet before dinner.   Yes [provider]  hydrochlorothiazide (HYDRODIURIL) 25 MG tablet Take 12.5 mg by mouth daily.   Yes [provider]  HYDROcodone-acetaminophen (NORCO/VICODIN) 5-325 MG tablet TAKE 1 TABLET BY MOUTH EVERY 4 HOURS AS NEEDED FOR moderate OR SEVERE PAIN FOR UP TO 5 DAYS. 01/26/18  Yes [provider]  lidocaine (XYLOCAINE) 2 % solution Patient: Mix 1part 2% viscous lidocaine, 1part H20. Swish & swallow 63mL of diluted mixture, 65min before meals and at bedtime, up to QID 01/29/18  Yes [name], MD  lidocaine-prilocaine (EMLA) cream Apply to affected area once 01/08/18  Yes Perlov, Marinell Blight, MD  lisinopril (PRINIVIL,ZESTRIL) 40 MG tablet Take 40 mg by mouth daily.   Yes [provider]  LORazepam (ATIVAN) 0.5 MG tablet Take 1 tablet (0.5 mg total) by mouth every 6 (six) hours as needed (Nausea or vomiting). 01/08/18  Yes Ardath Sax, MD  metFORMIN (GLUCOPHAGE) 1000 MG tablet Take 1,000 mg by mouth 2 (two) times  daily with a meal.   Yes [provider]  NON FORMULARY Place 0.5 application into both eyes at bedtime. Ocular Lubricant ointment 3.5 gm   Yes [provider]  ondansetron (ZOFRAN) 8 MG tablet Take 1 tablet (8 mg total) by mouth 2 (two) times daily as needed for refractory nausea / vomiting. Start on day 3 after chemo. 01/08/18  Yes Ardath Sax, MD  pravastatin (PRAVACHOL) 40 MG tablet Take 40 mg by mouth daily.   Yes [provider]  sodium fluoride (FLUORISHIELD) 1.1 % GEL dental gel Instill one drop of gel  per tooth space of fluoride tray. Place over teeth for 5 minutes. Remove. Spit out excess. Repeat nightly. 01/01/18  Yes Lenn Cal, DDS  cephALEXin (KEFLEX) 500 MG capsule Take 1 capsule (500 mg total) by mouth 3 (three) times daily. 02/05/18   Mollye Guinta, Barbette Hair, MD  dexamethasone (DECADRON) 4 MG tablet Take 2 tablets (8 mg total) by mouth daily. Start the day after chemotherapy for 2 days. 01/08/18   Ardath Sax, MD  prochlorperazine (COMPAZINE) 10 MG tablet Take 1 tablet (10 mg total) by mouth every 6 (six) hours as needed (Nausea or vomiting). 01/08/18   Ardath Sax, MD    Family History Family History  Problem Relation Age of Onset  . Lung cancer Mother   . Hypertension Mother   . Breast cancer Mother   . Heart attack Father   . Hypertension Sister   . Hypertension Brother   . Hypertension Sister     Social History Social History   Tobacco Use  . Smoking status: Never Smoker  . Smokeless tobacco: Never Used  Substance Use Topics  . Alcohol use: Yes    Comment: reports occasional beer or wine. maybe two times monthly  . Drug use: No     Allergies   Patient has no known allergies.   Review of Systems Review of Systems  Constitutional: Positive for fever.  HENT: Negative for congestion.   Respiratory: Negative for cough and shortness of breath.   Cardiovascular: Negative for chest pain.  Gastrointestinal: Negative for abdominal pain, nausea and vomiting.  Genitourinary: Positive for dysuria and hematuria. Negative for flank pain.  All other systems reviewed and are negative.    Physical Exam Updated Vital Signs BP 121/65   Pulse 77   Temp 99.4 F (37.4 C) (Oral)   Resp 15   Ht 5\' 9"  (1.753 m)   Wt 93.4 kg (206 lb)   SpO2 93%   BMI 30.42 kg/m   Physical Exam  Constitutional: He is oriented to person, place, and time.  Elderly but nontoxic, no acute distress  HENT:  Head: Normocephalic and atraumatic.  Posterior oropharynx difficult to  visualize  Neck:  Asymmetric swelling of the neck noted  Cardiovascular: Normal rate, regular rhythm and normal heart sounds.  No murmur heard. Pulmonary/Chest: Effort normal and breath sounds normal. No respiratory distress. He has no wheezes.  Abdominal: Soft. Bowel sounds are normal. There is no tenderness. There is no rebound.  G tube in place  Neurological: He is alert and oriented to person, place, and time.  Skin: Skin is warm and dry.  Psychiatric: He has a normal mood and affect.  Nursing note and vitals reviewed.    ED Treatments / Results  Labs (all labs ordered are listed, but only abnormal results are displayed) Labs Reviewed  URINALYSIS, ROUTINE W REFLEX MICROSCOPIC - Abnormal; Notable for the following components:  Result Value   Hgb urine dipstick SMALL (*)    All other components within normal limits  CBC - Abnormal; Notable for the following components:   RBC 3.74 (*)    Hemoglobin 10.8 (*)    HCT 32.3 (*)    Platelets 142 (*)    All other components within normal limits  COMPREHENSIVE METABOLIC PANEL - Abnormal; Notable for the following components:   Potassium 3.3 (*)    Chloride 100 (*)    Glucose, Bld 114 (*)    Calcium 8.3 (*)    Total Protein 5.9 (*)    Albumin 2.8 (*)    All other components within normal limits  DIFFERENTIAL - Abnormal; Notable for the following components:   Lymphs Abs 0.5 (*)    All other components within normal limits  CULTURE, BLOOD (ROUTINE X 2)  URINE CULTURE  CBC WITH DIFFERENTIAL/PLATELET  COMPREHENSIVE METABOLIC PANEL  TYPE AND SCREEN  TYPE AND SCREEN  ABO/RH    EKG None  Radiology No results found.  Procedures Procedures (including critical care time)  Medications Ordered in ED Medications  cefTRIAXone (ROCEPHIN) 1 g in sodium chloride 0.9 % 100 mL IVPB (0 g Intravenous Stopped 02/05/18 0531)     Initial Impression / Assessment and Plan / ED Course  I have reviewed the triage vital signs and the  nursing notes.  Pertinent labs & imaging results that were available during my care of the patient were reviewed by me and considered in my medical decision making (see chart for details).     Patient presents with temperatures of 100.7.  Also reports urinary complaints.  On admission, vital signs are reassuring.  No evidence of tachycardia or hypoxia.  His exam is fairly benign.  Lab work obtained.  No significant leukocytosis.  Initially lab work resulted with a hemoglobin of 7.2.  Most recent was 12.  This was repeated to verify.  Repeat is 10.8.  Suspect lab error.  Urinalysis with 6-10 red cells, 0-5 white cells, no significant bacteria.  However, given patient's symptoms, would like to culture and treat.  Given the patient is not neutropenic, feel he is safe for discharge and close follow-up.  Blood cultures are pending.  After history, exam, and medical workup I feel the patient has been appropriately medically screened and is safe for discharge home. Pertinent diagnoses were discussed with the patient. Patient was given return precautions.   Final Clinical Impressions(s) / ED Diagnoses   Final diagnoses:  Acute cystitis with hematuria  Fever, unspecified fever cause    ED Discharge Orders        Ordered    cephALEXin (KEFLEX) 500 MG capsule  3 times daily     02/05/18 0716       Niam Nepomuceno, Barbette Hair, MD 02/05/18 [id number]    Merryl Hacker, MD 02/05/18 [phone]

## 2018-02-05 NOTE — Discharge Instructions (Addendum)
You were seen today for fever and urinary complaints.  You may have a urinary tract infection.  Urine culture is pending.  Your repeat lab work is reassuring.  You are not anemic.  Take antibiotics as prescribed.  If you develop recurrent fevers or worsening symptoms you should be reevaluated.

## 2018-02-06 ENCOUNTER — Ambulatory Visit
Admission: RE | Admit: 2018-02-06 | Discharge: 2018-02-06 | Disposition: A | Discharge status: Home / Self Care | Payer: No Typology Code available for payment source | Source: Ambulatory Visit | Attending: Radiation Oncology | Admitting: Radiation Oncology

## 2018-02-06 ENCOUNTER — Telehealth: Payer: Self-pay

## 2018-02-06 DIAGNOSIS — C01 Malignant neoplasm of base of tongue: Secondary | ICD-10-CM | POA: Insufficient documentation

## 2018-02-06 DIAGNOSIS — Z510 Encounter for antineoplastic radiation therapy: Secondary | ICD-10-CM | POA: Insufficient documentation

## 2018-02-06 LAB — URINE CULTURE

## 2018-02-06 NOTE — Assessment & Plan Note (Signed)
72 y.o.  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  Agree with the current clinical stage II based on clinical T3, clinical N1 disease.  Based on the age and comorbidities, patient is not a candidate for cisplatin chemotherapy, but will likely be able to tolerate concurrent weekly carboplatin/paclitaxel combination administered with radiotherapy.  Patient presents to the clinic to initiate the recommended therapy.  In the interim, he denies any new complaints.  He underwent chemotherapy teaching.  Chemotherapy recommendation, component medications, schedule of administration, and potential toxicities have been discussed with patient in detail.  He affirms his decision to continue with the treatment as her.  Clinical evaluation lab work today permissive to proceed with the treatment.  Plan: -Proceed with systemic therapy with carboplatin/paclitaxel, week #1. -PEG tube and Infuse-a-Port placement as soon as possible, until then, will administer systemic chemotherapy either through PICC line or through peripheral IV to start as close to initiation of radiotherapy as possible. -Return to clinic in 1 week: Labs, possible next dose of systemic chemotherapy.

## 2018-02-06 NOTE — Progress Notes (Signed)
[facility] Follow-up Visit:  Assessment: Squamous cell carcinoma of base of tongue ([place]) 72 y.o.  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  Agree with the current clinical stage II based on clinical T3, clinical N1 disease.  Based on the age and comorbidities, patient is not a candidate for cisplatin chemotherapy, but will likely be able to tolerate concurrent weekly carboplatin/paclitaxel combination administered with radiotherapy.  Patient presents to the clinic to initiate the recommended therapy.  In the interim, he denies any new complaints.  He underwent chemotherapy teaching.  Chemotherapy recommendation, component medications, schedule of administration, and potential toxicities have been discussed with patient in detail.  He affirms his decision to continue with the treatment as her.  Clinical evaluation lab work today permissive to proceed with the treatment.  Plan: -Proceed with systemic therapy with carboplatin/paclitaxel, week #1. -PEG tube and Infuse-a-Port placement as soon as possible, until then, will administer systemic chemotherapy either through PICC line or through peripheral IV to start as close to initiation of radiotherapy as possible. -Return to clinic in 1 week: Labs, possible next dose of systemic chemotherapy.   Voice recognition software was used and creation of this note. Despite my best effort at editing the text, some misspelling/errors may have occurred.  No orders of the defined types were placed in this encounter.   Cancer Staging Squamous cell carcinoma of base of tongue (HCC) Staging form: Pharynx - HPV-Mediated Oropharynx, AJCC 8th Edition - Clinical: Stage II (cT3, cN1, cM0, p16+) - Signed by [name], MD on 01/01/2018   All questions were answered.  . The patient knows to call the clinic with any problems, questions or concerns.  This note was  electronically signed.    History of Presenting Illness Edgar Herrera is a 72 y.o. male followed in the [facility] for diagnosis of squamous cell carcinoma of tongue base, referred by Dr. [name]. Patient initially presented to a [place] emergency department 11/29/17 with complaints of intermittent dysphasia and hemoptysis episode.  CT scan of the neck obtained at the time demonstrated a large exophytic mass. Patient was seen by Dr.S Isac Caddy at [facility] on 12/10/17 and underwent a fiberoptic laryngoscopy on 12/17/17.  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  Additional staging imaging was obtained with PET/CT demonstrating no evidence of distant metastatic disease, but Lt base of the tongue mass, ipsilateral adenopathy.  Patient was evaluated by [facility] for possible tors procedure and was not found to be a suitable candidate according to Dr. Conley Canal.  Patient was subsequently evaluated by Dr. [name] who has referred the patient to our clinic for possible concurrent multimodality therapy.  Patient presents to the clinic today to initiate a systemic component of his treatment with carboplatin and paclitaxel patient denies any new complaints since the last visit to the clinic.  Oncological/hematological History:   Squamous cell carcinoma of base of tongue ([place])   12/17/2017 Initial Diagnosis    Squamous cell carcinoma of base of tongue ([place]):  -- laryngoscopic biopsy of the mass at the base of the tongue base positive for invasive poorly differentiated carcinoma positive for p40 and p16      01/01/2018 Cancer Staging    Staging form: Pharynx - HPV-Mediated Oropharynx, AJCC 8th Edition - Clinical: Stage II (cT3, cN1, cM0, p16+) - Signed by [name], MD on 01/01/2018  01/15/2018 -  Chemotherapy    Carboplatin AUC 2, d1 + paclitaxel 41m/m2, d1 QWk         Medical History: Past Medical History:  Diagnosis Date  . Allergic rhinitis   . Atrial fibrillation ([place]   . Diabetes mellitus without complication ([place]   . Hyperlipidemia   . Hypertension   . Prostate cancer ([facility]     Surgical History: Past Surgical History:  Procedure Laterality Date  . KNEE SURGERY Bilateral    2007 and 1998,   . PROSTATECTOMY  12/15/2006  . SKIN GRAFT     as a child left elbow  . TONSILECTOMY/ADENOIDECTOMY WITH MYRINGOTOMY     as a child    Family History: Family History  Problem Relation Age of Onset  . Lung cancer Mother   . Hypertension Mother   . Breast cancer Mother   . Heart attack Father   . Hypertension Sister   . Hypertension Brother   . Hypertension Sister     Social History: Social History   Socioeconomic History  . Marital status: Married    Spouse name: Not on file  . Number of children: Not on file  . Years of education: Not on file  . Highest education level: Not on file  Occupational History  . Not on file  Social Needs  . Financial resource strain: Not on file  . Food insecurity:    Worry: Not on file    Inability: Not on file  . Transportation needs:    Medical: Not on file    Non-medical: Not on file  Tobacco Use  . Smoking status: Never Smoker  . Smokeless tobacco: Never Used  Substance and Sexual Activity  . Alcohol use: Yes    Comment: reports occasional beer or wine. maybe two times monthly  . Drug use: No  . Sexual activity: Not on file  Lifestyle  . Physical activity:    Days per week: Not on file    Minutes per session: Not on file  . Stress: Not on file  Relationships  . Social connections:    Talks on phone: Not on file    Gets together: Not on file    Attends religious service: Not on file    Active member of club or organization: Not on file    Attends meetings of clubs or organizations: Not on file    Relationship status: Not on file  . Intimate partner violence:    Fear of current  or ex partner: Not on file    Emotionally abused: Not on file    Physically abused: Not on file    Forced sexual activity: Not on file  Other Topics Concern  . Not on file  Social History Narrative  . Not on file    Allergies: No Known Allergies  Medications:  Current Outpatient Medications  Medication Sig Dispense Refill  . allopurinol (ZYLOPRIM) 100 MG tablet Take 100 mg by mouth daily.    .Marland KitchenamLODipine (NORVASC) 10 MG tablet Take 10 mg by mouth daily.    .Marland Kitchenapixaban (ELIQUIS) 5 MG TABS tablet Take 5 mg by mouth 2 (two) times daily.    . Artificial Tear Solution (SYSTANE CONTACTS) SOLN Apply 1 drop to eye daily.    . cyanocobalamin 500 MCG tablet Take 500 mcg by mouth 2 (two) times daily.    .Marland Kitchendexamethasone (DECADRON) 4 MG tablet Take 2 tablets (8 mg total) by mouth daily. Start the day after chemotherapy for  2 days. 30 tablet 1  . glipiZIDE (GLUCOTROL) 5 MG tablet Take 2.5-5 mg by mouth 2 (two) times daily before a meal. Take 1 tablet before breakfast and Take 1/2 tablet before dinner.    . hydrochlorothiazide (HYDRODIURIL) 25 MG tablet Take 12.5 mg by mouth daily.    Marland Kitchen lidocaine-prilocaine (EMLA) cream Apply to affected area once 30 g 3  . lisinopril (PRINIVIL,ZESTRIL) 40 MG tablet Take 40 mg by mouth daily.    Marland Kitchen LORazepam (ATIVAN) 0.5 MG tablet Take 1 tablet (0.5 mg total) by mouth every 6 (six) hours as needed (Nausea or vomiting). 30 tablet 0  . metFORMIN (GLUCOPHAGE) 1000 MG tablet Take 1,000 mg by mouth 2 (two) times daily with a meal.    . NON FORMULARY Place 0.5 application into both eyes at bedtime. Ocular Lubricant ointment 3.5 gm    . ondansetron (ZOFRAN) 8 MG tablet Take 1 tablet (8 mg total) by mouth 2 (two) times daily as needed for refractory nausea / vomiting. Start on day 3 after chemo. 30 tablet 1  . pravastatin (PRAVACHOL) 40 MG tablet Take 40 mg by mouth daily.    . prochlorperazine (COMPAZINE) 10 MG tablet Take 1 tablet (10 mg total) by mouth every 6 (six) hours  as needed (Nausea or vomiting). 30 tablet 1  . sodium fluoride (FLUORISHIELD) 1.1 % GEL dental gel Instill one drop of gel per tooth space of fluoride tray. Place over teeth for 5 minutes. Remove. Spit out excess. Repeat nightly. 120 mL prn  . cephALEXin (KEFLEX) 500 MG capsule Take 1 capsule (500 mg total) by mouth 3 (three) times daily. 21 capsule 0  . HYDROcodone-acetaminophen (NORCO/VICODIN) 5-325 MG tablet TAKE 1 TABLET BY MOUTH EVERY 4 HOURS AS NEEDED FOR moderate OR SEVERE PAIN FOR UP TO 5 DAYS.  0  . lidocaine (XYLOCAINE) 2 % solution Patient: Mix 1part 2% viscous lidocaine, 1part H20. Swish & swallow 45m of diluted mixture, 327m before meals and at bedtime, up to QID 100 mL 5   No current facility-administered medications for this visit.     Review of Systems: Review of Systems  All other systems reviewed and are negative.    PHYSICAL EXAMINATION Blood pressure (!) 148/78, pulse 74, temperature 98.4 F (36.9 C), temperature source Oral, resp. rate 18, height 5' 8"  (1.727 m), weight 213 lb 12.8 oz (97 kg), SpO2 96 %.  ECOG PERFORMANCE STATUS: 1 - Symptomatic but completely ambulatory  Physical Exam  Constitutional: He is oriented to person, place, and time. He appears well-developed and well-nourished. No distress.  HENT:  Head: Normocephalic and atraumatic.  Mouth/Throat: Oropharynx is clear and moist. No oropharyngeal exudate.  Eyes: Pupils are equal, round, and reactive to light. Conjunctivae and EOM are normal. No scleral icterus.  Neck: No thyromegaly present.  Cardiovascular: Normal rate, regular rhythm, normal heart sounds and intact distal pulses.  No murmur heard. Pulmonary/Chest: Effort normal and breath sounds normal. No stridor. No respiratory distress. He has no wheezes. He has no rales.  Abdominal: Soft. Bowel sounds are normal. He exhibits no distension and no mass. There is no tenderness. There is no guarding.  Musculoskeletal: He exhibits no edema.   Lymphadenopathy:    He has cervical adenopathy.  Neurological: He is alert and oriented to person, place, and time. He displays normal reflexes. No cranial nerve deficit or sensory deficit.  Skin: Skin is warm and dry. No rash noted. He is not diaphoretic. No erythema. No pallor.  LABORATORY DATA: I have personally reviewed the data as listed: Appointment on 01/18/2018  Component Date Value Ref Range Status  . Phosphorus 01/18/2018 3.9  2.5 - 4.6 mg/dL Final   Performed at [facility] [address], [place], [place] [address]  . Magnesium 01/18/2018 2.0  1.5 - 2.5 mg/dL Final   Performed at [facility], [place] [address] [place], [place] [address]  . Sodium 01/18/2018 141  136 - 145 mmol/L Final  . Potassium 01/18/2018 4.0  3.5 - 5.1 mmol/L Final  . Chloride 01/18/2018 103  98 - 109 mmol/L Final  . CO2 01/18/2018 28  22 - 29 mmol/L Final  . Glucose, Bld 01/18/2018 119  70 - 140 mg/dL Final  . BUN 01/18/2018 13  7 - 26 mg/dL Final  . Creatinine 01/18/2018 0.94  0.70 - 1.30 mg/dL Final  . Calcium 01/18/2018 9.5  8.4 - 10.4 mg/dL Final  . Total Protein 01/18/2018 7.1  6.4 - 8.3 g/dL Final  . Albumin 01/18/2018 3.4* 3.5 - 5.0 g/dL Final  . AST 01/18/2018 15  5 - 34 U/L Final  . ALT 01/18/2018 18  0 - 55 U/L Final  . Alkaline Phosphatase 01/18/2018 76  40 - 150 U/L Final  . Total Bilirubin 01/18/2018 0.4  0.2 - 1.2 mg/dL Final  . GFR, Est Non Af Am 01/18/2018 >60  >60 mL/min Final  . GFR, Est AFR Am 01/18/2018 >60  >60 mL/min Final   Comment: (NOTE) The eGFR has been calculated using the CKD EPI equation. This calculation has not been validated in all clinical situations. eGFR's persistently <60 mL/min signify possible Chronic Kidney Disease.   Georgiann Hahn gap 01/18/2018 10  3 - 11 Final   Performed at [facility], [place] [address], [place], [place] [address]  . WBC Count 01/18/2018 8.1  4.0 - 10.3 K/uL Final  .  RBC 01/18/2018 4.88  4.20 - 5.82 MIL/uL Final  . Hemoglobin 01/18/2018 13.8  13.0 - 17.1 g/dL Final  . HCT 01/18/2018 42.5  38.4 - 49.9 % Final  . MCV 01/18/2018 87.1  79.3 - 98.0 fL Final  . MCH 01/18/2018 28.3  27.2 - 33.4 pg Final  . MCHC 01/18/2018 32.5  32.0 - 36.0 g/dL Final  . RDW 01/18/2018 13.3  11.0 - 14.6 % Final  . Platelet Count 01/18/2018 192  140 - 400 K/uL Final  . Neutrophils Relative % 01/18/2018 67  % Final  . Neutro Abs 01/18/2018 5.4  1.5 - 6.5 K/uL Final  . Lymphocytes Relative 01/18/2018 21  % Final  . Lymphs Abs 01/18/2018 1.7  0.9 - 3.3 K/uL Final  . Monocytes Relative 01/18/2018 10  % Final  . Monocytes Absolute 01/18/2018 0.8  0.1 - 0.9 K/uL Final  . Eosinophils Relative 01/18/2018 2  % Final  . Eosinophils Absolute 01/18/2018 0.2  0.0 - 0.5 K/uL Final  . Basophils Relative 01/18/2018 0  % Final  . Basophils Absolute 01/18/2018 0.0  0.0 - 0.1 K/uL Final   Performed at [facility], [place] [address] [place], [place] [address]       Ardath Sax, MD

## 2018-02-06 NOTE — Telephone Encounter (Signed)
Patient returning missed phone call from [facility]. RN unable to identify to called the patient. Patient verbalized understanding.

## 2018-02-07 ENCOUNTER — Ambulatory Visit
Admission: RE | Admit: 2018-02-07 | Discharge: 2018-02-07 | Disposition: A | Discharge status: Home / Self Care | Payer: No Typology Code available for payment source | Source: Ambulatory Visit | Attending: Radiation Oncology | Admitting: Radiation Oncology

## 2018-02-07 DIAGNOSIS — Z510 Encounter for antineoplastic radiation therapy: Secondary | ICD-10-CM | POA: Diagnosis not present

## 2018-02-08 ENCOUNTER — Encounter: Payer: Self-pay | Admitting: Radiation Oncology

## 2018-02-08 ENCOUNTER — Inpatient Hospital Stay: Payer: No Typology Code available for payment source

## 2018-02-08 ENCOUNTER — Encounter: Payer: Self-pay | Admitting: Hematology and Oncology

## 2018-02-08 ENCOUNTER — Inpatient Hospital Stay: Payer: No Typology Code available for payment source | Admitting: Nutrition

## 2018-02-08 ENCOUNTER — Inpatient Hospital Stay: Payer: No Typology Code available for payment source | Attending: Radiation Oncology

## 2018-02-08 ENCOUNTER — Telehealth: Payer: Self-pay

## 2018-02-08 ENCOUNTER — Inpatient Hospital Stay (HOSPITAL_BASED_OUTPATIENT_CLINIC_OR_DEPARTMENT_OTHER): Payer: No Typology Code available for payment source | Admitting: Hematology and Oncology

## 2018-02-08 ENCOUNTER — Ambulatory Visit
Admission: RE | Admit: 2018-02-08 | Discharge: 2018-02-08 | Disposition: A | Discharge status: Home / Self Care | Payer: No Typology Code available for payment source | Source: Ambulatory Visit | Attending: Radiation Oncology | Admitting: Radiation Oncology

## 2018-02-08 VITALS — BP 130/78 | HR 87 | Temp 98.7°F | Resp 18 | Ht 69.0 in | Wt 201.7 lb

## 2018-02-08 DIAGNOSIS — R509 Fever, unspecified: Secondary | ICD-10-CM | POA: Insufficient documentation

## 2018-02-08 DIAGNOSIS — Z5111 Encounter for antineoplastic chemotherapy: Secondary | ICD-10-CM

## 2018-02-08 DIAGNOSIS — I4891 Unspecified atrial fibrillation: Secondary | ICD-10-CM | POA: Diagnosis not present

## 2018-02-08 DIAGNOSIS — C01 Malignant neoplasm of base of tongue: Secondary | ICD-10-CM | POA: Diagnosis not present

## 2018-02-08 DIAGNOSIS — I10 Essential (primary) hypertension: Secondary | ICD-10-CM | POA: Diagnosis not present

## 2018-02-08 DIAGNOSIS — E119 Type 2 diabetes mellitus without complications: Secondary | ICD-10-CM | POA: Insufficient documentation

## 2018-02-08 DIAGNOSIS — Z510 Encounter for antineoplastic radiation therapy: Secondary | ICD-10-CM | POA: Diagnosis not present

## 2018-02-08 LAB — CBC WITH DIFFERENTIAL (CANCER CENTER ONLY)
BASOS ABS: 0 10*3/uL (ref 0.0–0.1)
BASOS PCT: 1 %
EOS PCT: 0 %
Eosinophils Absolute: 0 10*3/uL (ref 0.0–0.5)
HCT: 36.2 % — ABNORMAL LOW (ref 38.4–49.9)
Hemoglobin: 12.1 g/dL — ABNORMAL LOW (ref 13.0–17.1)
Lymphocytes Relative: 14 %
Lymphs Abs: 0.4 10*3/uL — ABNORMAL LOW (ref 0.9–3.3)
MCH: 28.9 pg (ref 27.2–33.4)
MCHC: 33.4 g/dL (ref 32.0–36.0)
MCV: 86.4 fL (ref 79.3–98.0)
MONO ABS: 0.5 10*3/uL (ref 0.1–0.9)
Monocytes Relative: 15 %
NEUTROS ABS: 2.3 10*3/uL (ref 1.5–6.5)
Neutrophils Relative %: 70 %
Platelet Count: 162 10*3/uL (ref 140–400)
RBC: 4.19 MIL/uL — ABNORMAL LOW (ref 4.20–5.82)
RDW: 13.5 % (ref 11.0–14.6)
WBC: 3.2 10*3/uL — AB (ref 4.0–10.3)

## 2018-02-08 LAB — CMP (CANCER CENTER ONLY)
ALK PHOS: 74 U/L (ref 40–150)
ALT: 25 U/L (ref 0–55)
ANION GAP: 9 (ref 3–11)
AST: 19 U/L (ref 5–34)
Albumin: 3.2 g/dL — ABNORMAL LOW (ref 3.5–5.0)
BUN: 13 mg/dL (ref 7–26)
CO2: 30 mmol/L — ABNORMAL HIGH (ref 22–29)
Calcium: 9.6 mg/dL (ref 8.4–10.4)
Chloride: 97 mmol/L — ABNORMAL LOW (ref 98–109)
Creatinine: 0.99 mg/dL (ref 0.70–1.30)
GFR, Est AFR Am: >60 mL/min (ref >60)
Glucose, Bld: 131 mg/dL (ref 70–140)
POTASSIUM: 3.6 mmol/L (ref 3.5–5.1)
SODIUM: 136 mmol/L (ref 136–145)
TOTAL PROTEIN: 7.1 g/dL (ref 6.4–8.3)
Total Bilirubin: 0.5 mg/dL (ref 0.2–1.2)

## 2018-02-08 LAB — PHOSPHORUS: PHOSPHORUS: 3 mg/dL (ref 2.5–4.6)

## 2018-02-08 LAB — MAGNESIUM: MAGNESIUM: 1.7 mg/dL (ref 1.7–2.4)

## 2018-02-08 MED ORDER — FAMOTIDINE IN NACL 20-0.9 MG/50ML-% IV SOLN
20.0000 mg | Freq: Once | INTRAVENOUS | Status: AC
  Administered 2018-02-08: 20 mg via INTRAVENOUS

## 2018-02-08 MED ORDER — PACLITAXEL CHEMO INJECTION 300 MG/50ML
45.0000 mg/m2 | Freq: Once | INTRAVENOUS | Status: AC
  Administered 2018-02-08: 96 mg via INTRAVENOUS
  Filled 2018-02-08: qty 16

## 2018-02-08 MED ORDER — SODIUM CHLORIDE 0.9 % IV SOLN
Freq: Once | INTRAVENOUS | Status: AC
  Administered 2018-02-08: 11:00:00 via INTRAVENOUS

## 2018-02-08 MED ORDER — HEPARIN SOD (PORK) LOCK FLUSH 100 UNIT/ML IV SOLN
500.0000 [IU] | Freq: Once | INTRAVENOUS | Status: AC | PRN
  Administered 2018-02-08: 500 [IU]
  Filled 2018-02-08: qty 5

## 2018-02-08 MED ORDER — DIPHENHYDRAMINE HCL 50 MG/ML IJ SOLN
50.0000 mg | Freq: Once | INTRAMUSCULAR | Status: AC
  Administered 2018-02-08: 50 mg via INTRAVENOUS

## 2018-02-08 MED ORDER — PALONOSETRON HCL INJECTION 0.25 MG/5ML
INTRAVENOUS | Status: AC
Start: 2018-02-08 — End: 2018-02-08
  Filled 2018-02-08: qty 5

## 2018-02-08 MED ORDER — PALONOSETRON HCL INJECTION 0.25 MG/5ML
0.2500 mg | Freq: Once | INTRAVENOUS | Status: AC
  Administered 2018-02-08: 0.25 mg via INTRAVENOUS

## 2018-02-08 MED ORDER — SODIUM CHLORIDE 0.9 % IV SOLN
20.0000 mg | Freq: Once | INTRAVENOUS | Status: AC
  Administered 2018-02-08: 20 mg via INTRAVENOUS
  Filled 2018-02-08: qty 2

## 2018-02-08 MED ORDER — SODIUM CHLORIDE 0.9% FLUSH
10.0000 mL | INTRAVENOUS | Status: DC | PRN
  Administered 2018-02-08: 10 mL
  Filled 2018-02-08: qty 10

## 2018-02-08 MED ORDER — SODIUM CHLORIDE 0.9 % IV SOLN
232.0000 mg | Freq: Once | INTRAVENOUS | Status: AC
  Administered 2018-02-08: 230 mg via INTRAVENOUS
  Filled 2018-02-08: qty 23

## 2018-02-08 MED ORDER — DIPHENHYDRAMINE HCL 50 MG/ML IJ SOLN
INTRAMUSCULAR | Status: AC
  Filled 2018-02-08: qty 1

## 2018-02-08 MED ORDER — FAMOTIDINE IN NACL 20-0.9 MG/50ML-% IV SOLN
INTRAVENOUS | Status: AC
  Filled 2018-02-08: qty 50

## 2018-02-08 MED ORDER — MORPHINE SULFATE (CONCENTRATE) 10 MG /0.5 ML PO SOLN: 10.0000 mg | ORAL | 0 refills | Status: DC | PRN

## 2018-02-08 NOTE — Telephone Encounter (Signed)
Printed avs and calender of upcoming appointment per 5/3 los 

## 2018-02-08 NOTE — Progress Notes (Signed)
Nutrition follow-up completed with patient during infusion for tongue cancer. Weight decreased and was documented as 201.7 pounds May 3 decreased from 206 pounds April 29. Patient is status post feeding tube placement. Patient is unable to eat or drink much by mouth. Wife reports that patient has been using Glucerna shakes in his feeding tube for sole nutrition. Patient had received oral nutrition supplements through the [facility].  He has not yet received enteral feeding through the [place].  Estimated nutrition needs: 2300-2500 cal, 115-130 g protein, 2.4 L fluid.  Nutrition diagnosis: Predicted suboptimal energy intake has evolved into inadequate oral intake  Intervention: Educated patient to give 7 bottles Jevity 1.5 in 4 feedings daily with 60 mL free water before and after bolus feedings.  In addition patient will flush feeding tube with 240 mL free water 3 times daily. Patient should discontinue Glucerna shake through feeding tube. Written instructions were provided.  Education was given.  Teach back method used. Asked patient's wife to contact the [place] for enteral formula. Provided patient with Jevity 1.5 samples. 7 bottles Jevity 1.5 provides approximately 2485 cal, 106 g protein in 1260 mL free water.  Monitoring, evaluation, goals: Patient will tolerate tube feeding to meet greater than 90% estimated needs for weight maintenance.  Next visit: Friday, May 10 during infusion.  **Disclaimer: This note was dictated with voice recognition software. Similar sounding words can inadvertently be transcribed and this note may contain transcription errors which may not have been corrected upon publication of note.**

## 2018-02-08 NOTE — Patient Instructions (Signed)
[facility] Discharge Instructions for Patients Receiving Chemotherapy  Today you received the following chemotherapy agents: Paclitaxel, Carboplatin  To help prevent nausea and vomiting after your treatment, we encourage you to take your nausea medication as directed.   If you develop nausea and vomiting that is not controlled by your nausea medication, call the clinic.   BELOW ARE SYMPTOMS THAT SHOULD BE REPORTED IMMEDIATELY:  *FEVER GREATER THAN 100.5 F  *CHILLS WITH OR WITHOUT FEVER  NAUSEA AND VOMITING THAT IS NOT CONTROLLED WITH YOUR NAUSEA MEDICATION  *UNUSUAL SHORTNESS OF BREATH  *UNUSUAL BRUISING OR BLEEDING  TENDERNESS IN MOUTH AND THROAT WITH OR WITHOUT PRESENCE OF ULCERS  *URINARY PROBLEMS  *BOWEL PROBLEMS  UNUSUAL RASH Items with * indicate a potential emergency and should be followed up as soon as possible.  Feel free to call the clinic should you have any questions or concerns. The clinic phone number is [phone].  Please show the CHEMO ALERT CARD at check-in to the Emergency Department and triage nurse.   

## 2018-02-10 LAB — CULTURE, BLOOD (ROUTINE X 2): CULTURE: NO GROWTH

## 2018-02-11 ENCOUNTER — Ambulatory Visit: Payer: No Typology Code available for payment source | Attending: Radiation Oncology

## 2018-02-11 ENCOUNTER — Ambulatory Visit
Admission: RE | Admit: 2018-02-11 | Discharge: 2018-02-11 | Disposition: A | Discharge status: Home / Self Care | Payer: No Typology Code available for payment source | Source: Ambulatory Visit | Attending: Radiation Oncology | Admitting: Radiation Oncology

## 2018-02-11 DIAGNOSIS — C01 Malignant neoplasm of base of tongue: Secondary | ICD-10-CM

## 2018-02-11 DIAGNOSIS — Z510 Encounter for antineoplastic radiation therapy: Secondary | ICD-10-CM | POA: Diagnosis not present

## 2018-02-11 MED ORDER — SONAFINE EX EMUL
1.0000 "application " | Freq: Once | CUTANEOUS | Status: AC
  Administered 2018-02-11: 1 via TOPICAL

## 2018-02-11 NOTE — Progress Notes (Signed)

## 2018-02-11 NOTE — Progress Notes (Signed)
[facility] Follow-up Visit:  Assessment: Squamous cell carcinoma of base of tongue ([place]) 72 y.o.  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  Agree with the current clinical stage II based on clinical T3, clinical N1 disease.  Based on the age and comorbidities, patient is not a candidate for cisplatin chemotherapy, but will likely be able to tolerate concurrent weekly carboplatin/paclitaxel combination administered with radiotherapy.  Patient presents to the clinic to continue systemic therapy therapy.  Appears to be tolerating infusion well so far.  Mild tinnitus has developed without associated hearing loss.  Clinical evaluation lab work-up permissive to proceed with the next cycle of systemic chemotherapy.  Plan: -Proceed with systemic therapy with carboplatin/paclitaxel, week #2. -PEG tube and Infuse-a-Port placement scheduled for tomorrow  -Return to clinic in 1 week: Labs, possible next dose of systemic chemotherapy.   Voice recognition software was used and creation of this note. Despite my best effort at editing the text, some misspelling/errors may have occurred.  No orders of the defined types were placed in this encounter.   Cancer Staging Squamous cell carcinoma of base of tongue (HCC) Staging form: Pharynx - HPV-Mediated Oropharynx, AJCC 8th Edition - Clinical: Stage II (cT3, cN1, cM0, p16+) - Signed by [name], MD on 01/01/2018   All questions were answered.  . The patient knows to call the clinic with any problems, questions or concerns.  This note was electronically signed.    History of Presenting Illness Edgar Herrera is a 72 y.o. male followed in the [facility] for diagnosis of squamous cell carcinoma of tongue base, referred by Dr. [name]. Patient initially presented to a [place] emergency department 11/29/17 with complaints of intermittent dysphasia and  hemoptysis episode.  CT scan of the neck obtained at the time demonstrated a large exophytic mass. Patient was seen by Dr.S Isac Caddy at [facility] on 12/10/17 and underwent a fiberoptic laryngoscopy on 12/17/17.  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  Additional staging imaging was obtained with PET/CT demonstrating no evidence of distant metastatic disease, but Lt base of the tongue mass, ipsilateral adenopathy.  Patient was evaluated by [facility] for possible tors procedure and was not found to be a suitable candidate according to Dr. Conley Canal.  Patient was subsequently evaluated by Dr. [name] who has referred the patient to our clinic for possible concurrent multimodality therapy.  Patient presents to the clinic today to continue systemic component of his treatment with carboplatin and paclitaxel.Patient denies any new complaints since the last visit to the clinic with exception of bilateral tinnitus left ear greater than the right.  No subjective decreased hearing.  Oncological/hematological History:   Squamous cell carcinoma of base of tongue ([place])   12/17/2017 Initial Diagnosis    Squamous cell carcinoma of base of tongue ([place]):  -- laryngoscopic biopsy of the mass at the base of the tongue base positive for invasive poorly differentiated carcinoma positive for p40 and p16      01/01/2018 Cancer Staging    Staging form: Pharynx - HPV-Mediated Oropharynx, AJCC 8th Edition - Clinical: Stage II (cT3, cN1, cM0, p16+) - Signed by [name], MD on 01/01/2018       01/16/2018 -  Radiation Therapy    Dr [name]:      01/18/2018 -  Chemotherapy    Carboplatin AUC 2, d1 +  paclitaxel 89m/m2, d1 QWk --Week #1, 01/18/18:       Medical History: Past Medical History:  Diagnosis Date  . Allergic rhinitis   . Atrial fibrillation ([facility]   . Diabetes mellitus without  complication ([facility]   . Hyperlipidemia   . Hypertension   . Prostate cancer ([facility]     Surgical History: Past Surgical History:  Procedure Laterality Date  . KNEE SURGERY Bilateral    2007 and 1998,   . PROSTATECTOMY  12/15/2006  . SKIN GRAFT     as a child left elbow  . TONSILECTOMY/ADENOIDECTOMY WITH MYRINGOTOMY     as a child    Family History: Family History  Problem Relation Age of Onset  . Lung cancer Mother   . Hypertension Mother   . Breast cancer Mother   . Heart attack Father   . Hypertension Sister   . Hypertension Brother   . Hypertension Sister     Social History: Social History   Socioeconomic History  . Marital status: Married    Spouse name: Not on file  . Number of children: Not on file  . Years of education: Not on file  . Highest education level: Not on file  Occupational History  . Not on file  Social Needs  . Financial resource strain: Not on file  . Food insecurity:    Worry: Not on file    Inability: Not on file  . Transportation needs:    Medical: Not on file    Non-medical: Not on file  Tobacco Use  . Smoking status: Never Smoker  . Smokeless tobacco: Never Used  Substance and Sexual Activity  . Alcohol use: Yes    Comment: reports occasional beer or wine. maybe two times monthly  . Drug use: No  . Sexual activity: Not on file  Lifestyle  . Physical activity:    Days per week: Not on file    Minutes per session: Not on file  . Stress: Not on file  Relationships  . Social connections:    Talks on phone: Not on file    Gets together: Not on file    Attends religious service: Not on file    Active member of club or organization: Not on file    Attends meetings of clubs or organizations: Not on file    Relationship status: Not on file  . Intimate partner violence:    Fear of current or ex partner: Not on file    Emotionally abused: Not on file    Physically abused: Not on file    Forced sexual activity: Not on file  Other  Topics Concern  . Not on file  Social History Narrative  . Not on file    Allergies: No Known Allergies  Medications:  Current Outpatient Medications  Medication Sig Dispense Refill  . allopurinol (ZYLOPRIM) 100 MG tablet Take 100 mg by mouth daily.    .Marland KitchenamLODipine (NORVASC) 10 MG tablet Take 10 mg by mouth daily.    .Marland Kitchenapixaban (ELIQUIS) 5 MG TABS tablet Take 5 mg by mouth 2 (two) times daily.    . Artificial Tear Solution (SYSTANE CONTACTS) SOLN Apply 1 drop to eye daily.    . cephALEXin (KEFLEX) 500 MG capsule Take 1 capsule (500 mg total) by mouth 3 (three) times daily. 21 capsule 0  . cyanocobalamin 500 MCG tablet Take 500 mcg by mouth 2 (two) times daily.    .Marland Kitchendexamethasone (DECADRON) 4 MG tablet Take 2 tablets (  8 mg total) by mouth daily. Start the day after chemotherapy for 2 days. 30 tablet 1  . glipiZIDE (GLUCOTROL) 5 MG tablet Take 2.5-5 mg by mouth 2 (two) times daily before a meal. Take 1 tablet before breakfast and Take 1/2 tablet before dinner.    . hydrochlorothiazide (HYDRODIURIL) 25 MG tablet Take 12.5 mg by mouth daily.    Marland Kitchen lidocaine (XYLOCAINE) 2 % solution Patient: Mix 1part 2% viscous lidocaine, 1part H20. Swish & swallow 82m of diluted mixture, 331m before meals and at bedtime, up to QID 100 mL 5  . lidocaine-prilocaine (EMLA) cream Apply to affected area once 30 g 3  . lisinopril (PRINIVIL,ZESTRIL) 40 MG tablet Take 40 mg by mouth daily.    . Marland KitchenORazepam (ATIVAN) 0.5 MG tablet Take 1 tablet (0.5 mg total) by mouth every 6 (six) hours as needed (Nausea or vomiting). 30 tablet 0  . metFORMIN (GLUCOPHAGE) 1000 MG tablet Take 1,000 mg by mouth 2 (two) times daily with a meal.    . Morphine Sulfate (MORPHINE CONCENTRATE) 10 mg / 0.5 ml concentrated solution Take 0.5 mLs (10 mg total) by mouth every 4 (four) hours as needed for up to 14 days for severe pain. 30 mL 0  . NON FORMULARY Place 0.5 application into both eyes at bedtime. Ocular Lubricant ointment 3.5 gm    .  ondansetron (ZOFRAN) 8 MG tablet Take 1 tablet (8 mg total) by mouth 2 (two) times daily as needed for refractory nausea / vomiting. Start on day 3 after chemo. (Patient not taking: Reported on 02/08/2018) 30 tablet 1  . pravastatin (PRAVACHOL) 40 MG tablet Take 40 mg by mouth daily.    . prochlorperazine (COMPAZINE) 10 MG tablet Take 1 tablet (10 mg total) by mouth every 6 (six) hours as needed (Nausea or vomiting). (Patient not taking: Reported on 02/08/2018) 30 tablet 1  . sodium fluoride (FLUORISHIELD) 1.1 % GEL dental gel Instill one drop of gel per tooth space of fluoride tray. Place over teeth for 5 minutes. Remove. Spit out excess. Repeat nightly. 120 mL prn   No current facility-administered medications for this visit.     Review of Systems: Review of Systems  All other systems reviewed and are negative.    PHYSICAL EXAMINATION Blood pressure 125/78, pulse 86, temperature 98 F (36.7 C), temperature source Oral, resp. rate 17, height _0  (1.727 m), weight 210 lb 4.8 oz (95.4 kg), SpO2 97 %.  ECOG PERFORMANCE STATUS: 1 - Symptomatic but completely ambulatory  Physical Exam  Constitutional: He is oriented to person, place, and time. He appears well-developed and well-nourished. No distress.  HENT:  Head: Normocephalic and atraumatic.  Mouth/Throat: Oropharynx is clear and moist. No oropharyngeal exudate.  Eyes: Pupils are equal, round, and reactive to light. Conjunctivae and EOM are normal. No scleral icterus.  Neck: No thyromegaly present.  Cardiovascular: Normal rate, regular rhythm, normal heart sounds and intact distal pulses.  No murmur heard. Pulmonary/Chest: Effort normal and breath sounds normal. No stridor. No respiratory distress. He has no wheezes. He has no rales.  Abdominal: Soft. Bowel sounds are normal. He exhibits no distension and no mass. There is no tenderness. There is no guarding.  Musculoskeletal: He exhibits no edema.  Lymphadenopathy:    He has cervical  adenopathy.  Neurological: He is alert and oriented to person, place, and time. He displays normal reflexes. No cranial nerve deficit or sensory deficit.  Skin: Skin is warm and dry. No rash noted. He is  not diaphoretic. No erythema. No pallor.     LABORATORY DATA: I have personally reviewed the data as listed: Appointment on 01/24/2018  Component Date Value Ref Range Status  . WBC Count 01/24/2018 6.4  4.0 - 10.3 K/uL Final  . RBC 01/24/2018 4.99  4.20 - 5.82 MIL/uL Final  . Hemoglobin 01/24/2018 14.1  13.0 - 17.1 g/dL Final  . HCT 01/24/2018 42.9  38.4 - 49.9 % Final  . MCV 01/24/2018 85.9  79.3 - 98.0 fL Final  . MCH 01/24/2018 28.2  27.2 - 33.4 pg Final  . MCHC 01/24/2018 32.8  32.0 - 36.0 g/dL Final  . RDW 01/24/2018 13.9  11.0 - 14.6 % Final  . Platelet Count 01/24/2018 197  140 - 400 K/uL Final  . Neutrophils Relative % 01/24/2018 75  % Final  . Neutro Abs 01/24/2018 4.9  1.5 - 6.5 K/uL Final  . Lymphocytes Relative 01/24/2018 16  % Final  . Lymphs Abs 01/24/2018 1.0  0.9 - 3.3 K/uL Final  . Monocytes Relative 01/24/2018 6  % Final  . Monocytes Absolute 01/24/2018 0.4  0.1 - 0.9 K/uL Final  . Eosinophils Relative 01/24/2018 2  % Final  . Eosinophils Absolute 01/24/2018 0.1  0.0 - 0.5 K/uL Final  . Basophils Relative 01/24/2018 1  % Final  . Basophils Absolute 01/24/2018 0.0  0.0 - 0.1 K/uL Final   Performed at [facility], [place] [address] [place], [place] [address]  . Sodium 01/24/2018 137  136 - 145 mmol/L Final  . Potassium 01/24/2018 3.9  3.5 - 5.1 mmol/L Final  . Chloride 01/24/2018 102  98 - 109 mmol/L Final  . CO2 01/24/2018 27  22 - 29 mmol/L Final  . Glucose, Bld 01/24/2018 220* 70 - 140 mg/dL Final  . BUN 01/24/2018 21  7 - 26 mg/dL Final  . Creatinine 01/24/2018 0.92  0.70 - 1.30 mg/dL Final  . Calcium 01/24/2018 9.1  8.4 - 10.4 mg/dL Final  . Total Protein 01/24/2018 6.7  6.4 - 8.3 g/dL Final  . Albumin 01/24/2018 3.2* 3.5 - 5.0 g/dL  Final  . AST 01/24/2018 15  5 - 34 U/L Final  . ALT 01/24/2018 29  0 - 55 U/L Final  . Alkaline Phosphatase 01/24/2018 76  40 - 150 U/L Final  . Total Bilirubin 01/24/2018 0.5  0.2 - 1.2 mg/dL Final  . GFR, Est Non Af Am 01/24/2018 >60  >60 mL/min Final  . GFR, Est AFR Am 01/24/2018 >60  >60 mL/min Final   Comment: (NOTE) The eGFR has been calculated using the CKD EPI equation. This calculation has not been validated in all clinical situations. eGFR's persistently <60 mL/min signify possible Chronic Kidney Disease.   Georgiann Hahn gap 01/24/2018 8  3 - 11 Final   Performed at [facility], [place] [address], [place], [place] [address]  . Magnesium 01/24/2018 1.7  1.7 - 2.4 mg/dL Final   Performed at [facility], [place] [address] [place], [place] [address]  . Phosphorus 01/24/2018 3.0  2.5 - 4.6 mg/dL Final   Performed at [facility] [address] [place], [place] [address]  Appointment on 01/18/2018  Component Date Value Ref Range Status  . Phosphorus 01/18/2018 3.9  2.5 - 4.6 mg/dL Final   Performed at [place] [address], [place], [place] [address]  . Magnesium 01/18/2018 2.0  1.5 - 2.5 mg/dL Final   Performed  at [facility], [place] [address] [place], [place] [address]  . Sodium 01/18/2018 141  136 - 145 mmol/L Final  . Potassium 01/18/2018 4.0  3.5 - 5.1 mmol/L Final  . Chloride 01/18/2018 103  98 - 109 mmol/L Final  . CO2 01/18/2018 28  22 - 29 mmol/L Final  . Glucose, Bld 01/18/2018 119  70 - 140 mg/dL Final  . BUN 01/18/2018 13  7 - 26 mg/dL Final  . Creatinine 01/18/2018 0.94  0.70 - 1.30 mg/dL Final  . Calcium 01/18/2018 9.5  8.4 - 10.4 mg/dL Final  . Total Protein 01/18/2018 7.1  6.4 - 8.3 g/dL Final  . Albumin 01/18/2018 3.4* 3.5 - 5.0 g/dL Final  . AST 01/18/2018 15  5 - 34 U/L Final  . ALT 01/18/2018 18  0 - 55 U/L Final  . Alkaline Phosphatase  01/18/2018 76  40 - 150 U/L Final  . Total Bilirubin 01/18/2018 0.4  0.2 - 1.2 mg/dL Final  . GFR, Est Non Af Am 01/18/2018 >60  >60 mL/min Final  . GFR, Est AFR Am 01/18/2018 >60  >60 mL/min Final   Comment: (NOTE) The eGFR has been calculated using the CKD EPI equation. This calculation has not been validated in all clinical situations. eGFR's persistently <60 mL/min signify possible Chronic Kidney Disease.   Georgiann Hahn gap 01/18/2018 10  3 - 11 Final   Performed at [facility], [place] [address] [place], [place] [address]  . WBC Count 01/18/2018 8.1  4.0 - 10.3 K/uL Final  . RBC 01/18/2018 4.88  4.20 - 5.82 MIL/uL Final  . Hemoglobin 01/18/2018 13.8  13.0 - 17.1 g/dL Final  . HCT 01/18/2018 42.5  38.4 - 49.9 % Final  . MCV 01/18/2018 87.1  79.3 - 98.0 fL Final  . MCH 01/18/2018 28.3  27.2 - 33.4 pg Final  . MCHC 01/18/2018 32.5  32.0 - 36.0 g/dL Final  . RDW 01/18/2018 13.3  11.0 - 14.6 % Final  . Platelet Count 01/18/2018 192  140 - 400 K/uL Final  . Neutrophils Relative % 01/18/2018 67  % Final  . Neutro Abs 01/18/2018 5.4  1.5 - 6.5 K/uL Final  . Lymphocytes Relative 01/18/2018 21  % Final  . Lymphs Abs 01/18/2018 1.7  0.9 - 3.3 K/uL Final  . Monocytes Relative 01/18/2018 10  % Final  . Monocytes Absolute 01/18/2018 0.8  0.1 - 0.9 K/uL Final  . Eosinophils Relative 01/18/2018 2  % Final  . Eosinophils Absolute 01/18/2018 0.2  0.0 - 0.5 K/uL Final  . Basophils Relative 01/18/2018 0  % Final  . Basophils Absolute 01/18/2018 0.0  0.0 - 0.1 K/uL Final   Performed at [facility], [place] [address], [place], [place] [address]       Ardath Sax, MD

## 2018-02-11 NOTE — Assessment & Plan Note (Signed)
72 y.o.  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  Agree with the current clinical stage II based on clinical T3, clinical N1 disease.  Based on the age and comorbidities, patient is not a candidate for cisplatin chemotherapy, but will likely be able to tolerate concurrent weekly carboplatin/paclitaxel combination administered with radiotherapy.  Patient presents to the clinic to continue systemic therapy therapy.  Appears to be tolerating infusion well so far.  Mild tinnitus has developed without associated hearing loss.  Clinical evaluation lab work-up permissive to proceed with the next cycle of systemic chemotherapy.  Plan: -Proceed with systemic therapy with carboplatin/paclitaxel, week #2. -PEG tube and Infuse-a-Port placement scheduled for tomorrow  -Return to clinic in 1 week: Labs, possible next dose of systemic chemotherapy.

## 2018-02-12 ENCOUNTER — Inpatient Hospital Stay (HOSPITAL_BASED_OUTPATIENT_CLINIC_OR_DEPARTMENT_OTHER): Payer: No Typology Code available for payment source | Admitting: Hematology and Oncology

## 2018-02-12 ENCOUNTER — Telehealth: Payer: Self-pay | Admitting: Hematology and Oncology

## 2018-02-12 ENCOUNTER — Ambulatory Visit
Admission: RE | Admit: 2018-02-12 | Discharge: 2018-02-12 | Disposition: A | Discharge status: Home / Self Care | Payer: No Typology Code available for payment source | Source: Ambulatory Visit | Attending: Radiation Oncology | Admitting: Radiation Oncology

## 2018-02-12 ENCOUNTER — Encounter: Payer: Self-pay | Admitting: *Deleted

## 2018-02-12 ENCOUNTER — Encounter: Payer: Self-pay | Admitting: Hematology and Oncology

## 2018-02-12 ENCOUNTER — Inpatient Hospital Stay: Payer: No Typology Code available for payment source

## 2018-02-12 DIAGNOSIS — E119 Type 2 diabetes mellitus without complications: Secondary | ICD-10-CM | POA: Diagnosis not present

## 2018-02-12 DIAGNOSIS — I4891 Unspecified atrial fibrillation: Secondary | ICD-10-CM

## 2018-02-12 DIAGNOSIS — C01 Malignant neoplasm of base of tongue: Secondary | ICD-10-CM

## 2018-02-12 DIAGNOSIS — Z510 Encounter for antineoplastic radiation therapy: Secondary | ICD-10-CM | POA: Diagnosis not present

## 2018-02-12 DIAGNOSIS — I10 Essential (primary) hypertension: Secondary | ICD-10-CM

## 2018-02-12 DIAGNOSIS — Z5111 Encounter for antineoplastic chemotherapy: Secondary | ICD-10-CM

## 2018-02-12 LAB — CBC WITH DIFFERENTIAL (CANCER CENTER ONLY)
BASOS ABS: 0 10*3/uL (ref 0.0–0.1)
Basophils Relative: 0 %
Eosinophils Absolute: 0 10*3/uL (ref 0.0–0.5)
Eosinophils Relative: 0 %
HEMATOCRIT: 38.4 % (ref 38.4–49.9)
HEMOGLOBIN: 12.6 g/dL — AB (ref 13.0–17.1)
LYMPHS PCT: 8 %
Lymphs Abs: 0.5 10*3/uL — ABNORMAL LOW (ref 0.9–3.3)
MCH: 28.8 pg (ref 27.2–33.4)
MCHC: 32.8 g/dL (ref 32.0–36.0)
MCV: 87.9 fL (ref 79.3–98.0)
MONO ABS: 0.4 10*3/uL (ref 0.1–0.9)
Monocytes Relative: 7 %
NEUTROS ABS: 4.9 10*3/uL (ref 1.5–6.5)
NEUTROS PCT: 85 %
Platelet Count: 160 10*3/uL (ref 140–400)
RBC: 4.37 MIL/uL (ref 4.20–5.82)
RDW: 13.8 % (ref 11.0–14.6)
WBC Count: 5.7 10*3/uL (ref 4.0–10.3)

## 2018-02-12 LAB — CMP (CANCER CENTER ONLY)
ALBUMIN: 3.5 g/dL (ref 3.5–5.0)
ALT: 32 U/L (ref 0–55)
ANION GAP: 6 (ref 3–11)
AST: 26 U/L (ref 5–34)
Alkaline Phosphatase: 70 U/L (ref 40–150)
BUN: 29 mg/dL — ABNORMAL HIGH (ref 7–26)
CHLORIDE: 99 mmol/L (ref 98–109)
CO2: 34 mmol/L — AB (ref 22–29)
Calcium: 9.9 mg/dL (ref 8.4–10.4)
Creatinine: 1.19 mg/dL (ref 0.70–1.30)
GFR, Estimated: 59 mL/min — ABNORMAL LOW (ref >60)
Glucose, Bld: 86 mg/dL (ref 70–140)
POTASSIUM: 4.3 mmol/L (ref 3.5–5.1)
SODIUM: 139 mmol/L (ref 136–145)
Total Bilirubin: 0.5 mg/dL (ref 0.2–1.2)
Total Protein: 7.1 g/dL (ref 6.4–8.3)

## 2018-02-12 LAB — MAGNESIUM: MAGNESIUM: 1.9 mg/dL (ref 1.7–2.4)

## 2018-02-12 NOTE — Telephone Encounter (Signed)
Patient already on schedule for lab/fu/chemo 5/17 as requested per 5/7 los. Cancelled 5/10 lab per los. Spoke with patient re updates appointments.

## 2018-02-13 ENCOUNTER — Ambulatory Visit
Admission: RE | Admit: 2018-02-13 | Discharge: 2018-02-13 | Disposition: A | Discharge status: Home / Self Care | Payer: No Typology Code available for payment source | Source: Ambulatory Visit | Attending: Radiation Oncology | Admitting: Radiation Oncology

## 2018-02-13 DIAGNOSIS — Z510 Encounter for antineoplastic radiation therapy: Secondary | ICD-10-CM | POA: Diagnosis not present

## 2018-02-13 NOTE — Progress Notes (Signed)
Oncology Nurse Navigator Documentation  To provide support, encouragement and care continuity, meeting with Mr. Deskin during Est Pt appt with Dr. Lebron Conners.  He was accompanied by his wife.  He has completed 4 cycles carboplatin/paclitaxel and 20 of 35 fxts RT for BOT p16+ ISCC.  He denied any issues with tmts, intermittent pain and nausea being controlled with medication.  Wife stated "this past week has been the best he's had".   He is using PEG for medications and 5-6 boxes of nutritional supplement daily.  Wife noted some difficulty dissolving medications, challenge in reaching daily goal of 7 boxes.  Interventions  Encouraged dissolving medications in warm water.  Encouraged increasing frequency/smaller volumes of supplement throughout the day to minimize satiety between feedings.  They voiced understanding to call me with needs/concerns.  Gayleen Orem, RN, BSN Head & Neck Oncology Nurse [place] at [place] [phone]

## 2018-02-14 ENCOUNTER — Ambulatory Visit
Admission: RE | Admit: 2018-02-14 | Discharge: 2018-02-14 | Disposition: A | Discharge status: Home / Self Care | Payer: No Typology Code available for payment source | Source: Ambulatory Visit | Attending: Radiation Oncology | Admitting: Radiation Oncology

## 2018-02-14 DIAGNOSIS — Z510 Encounter for antineoplastic radiation therapy: Secondary | ICD-10-CM | POA: Diagnosis not present

## 2018-02-15 ENCOUNTER — Inpatient Hospital Stay (HOSPITAL_BASED_OUTPATIENT_CLINIC_OR_DEPARTMENT_OTHER): Payer: No Typology Code available for payment source | Admitting: Medical

## 2018-02-15 ENCOUNTER — Inpatient Hospital Stay: Payer: No Typology Code available for payment source

## 2018-02-15 ENCOUNTER — Other Ambulatory Visit: Payer: Self-pay

## 2018-02-15 ENCOUNTER — Ambulatory Visit
Admission: RE | Admit: 2018-02-15 | Discharge: 2018-02-15 | Disposition: A | Discharge status: Home / Self Care | Payer: No Typology Code available for payment source | Source: Ambulatory Visit | Attending: Radiation Oncology | Admitting: Radiation Oncology

## 2018-02-15 ENCOUNTER — Encounter: Payer: Self-pay | Admitting: Radiation Oncology

## 2018-02-15 ENCOUNTER — Other Ambulatory Visit: Payer: Self-pay | Admitting: Medical

## 2018-02-15 ENCOUNTER — Inpatient Hospital Stay: Payer: No Typology Code available for payment source | Admitting: Nutrition

## 2018-02-15 VITALS — BP 105/72 | HR 79 | Temp 98.8°F | Resp 18

## 2018-02-15 DIAGNOSIS — Z510 Encounter for antineoplastic radiation therapy: Secondary | ICD-10-CM | POA: Diagnosis not present

## 2018-02-15 DIAGNOSIS — C01 Malignant neoplasm of base of tongue: Secondary | ICD-10-CM

## 2018-02-15 DIAGNOSIS — R509 Fever, unspecified: Secondary | ICD-10-CM

## 2018-02-15 DIAGNOSIS — Z5111 Encounter for antineoplastic chemotherapy: Secondary | ICD-10-CM | POA: Diagnosis not present

## 2018-02-15 MED ORDER — SODIUM CHLORIDE 0.9 % IV SOLN
45.0000 mg/m2 | Freq: Once | INTRAVENOUS | Status: AC
  Administered 2018-02-15: 96 mg via INTRAVENOUS
  Filled 2018-02-15: qty 16

## 2018-02-15 MED ORDER — SULFAMETHOXAZOLE-TRIMETHOPRIM 200-40 MG/5ML PO SUSP: 20.0000 mL | Freq: Two times a day (BID) | ORAL | 0 refills | Status: DC

## 2018-02-15 MED ORDER — DIPHENHYDRAMINE HCL 50 MG/ML IJ SOLN
50.0000 mg | Freq: Once | INTRAMUSCULAR | Status: AC
  Administered 2018-02-15: 50 mg via INTRAVENOUS

## 2018-02-15 MED ORDER — FAMOTIDINE IN NACL 20-0.9 MG/50ML-% IV SOLN
20.0000 mg | Freq: Once | INTRAVENOUS | Status: AC
  Administered 2018-02-15: 20 mg via INTRAVENOUS

## 2018-02-15 MED ORDER — FAMOTIDINE IN NACL 20-0.9 MG/50ML-% IV SOLN
INTRAVENOUS | Status: AC
Start: 2018-02-15 — End: ?
  Filled 2018-02-15: qty 50

## 2018-02-15 MED ORDER — SODIUM CHLORIDE 0.9 % IV SOLN
20.0000 mg | Freq: Once | INTRAVENOUS | Status: AC
  Administered 2018-02-15: 20 mg via INTRAVENOUS
  Filled 2018-02-15: qty 2

## 2018-02-15 MED ORDER — PALONOSETRON HCL INJECTION 0.25 MG/5ML
INTRAVENOUS | Status: AC
  Filled 2018-02-15: qty 5

## 2018-02-15 MED ORDER — PALONOSETRON HCL INJECTION 0.25 MG/5ML
0.2500 mg | Freq: Once | INTRAVENOUS | Status: AC
  Administered 2018-02-15: 0.25 mg via INTRAVENOUS

## 2018-02-15 MED ORDER — CARBOPLATIN CHEMO INJECTION 450 MG/45ML
200.0000 mg | Freq: Once | INTRAVENOUS | Status: AC
  Administered 2018-02-15: 200 mg via INTRAVENOUS
  Filled 2018-02-15: qty 20

## 2018-02-15 MED ORDER — HEPARIN SOD (PORK) LOCK FLUSH 100 UNIT/ML IV SOLN
500.0000 [IU] | Freq: Once | INTRAVENOUS | Status: AC | PRN
  Administered 2018-02-15: 500 [IU]
  Filled 2018-02-15: qty 5

## 2018-02-15 MED ORDER — SODIUM CHLORIDE 0.9% FLUSH
10.0000 mL | INTRAVENOUS | Status: DC | PRN
  Administered 2018-02-15: 10 mL
  Filled 2018-02-15: qty 10

## 2018-02-15 MED ORDER — SODIUM CHLORIDE 0.9 % IV SOLN
Freq: Once | INTRAVENOUS | Status: AC
  Administered 2018-02-15: 11:00:00 via INTRAVENOUS

## 2018-02-15 MED ORDER — DIPHENHYDRAMINE HCL 50 MG/ML IJ SOLN
INTRAMUSCULAR | Status: AC
  Filled 2018-02-15: qty 1

## 2018-02-15 NOTE — Progress Notes (Signed)
Sandi Mealy, PA to the infusion room to see patient. Per wife temperature up to 100.7, he took tylenol and 5/8 night 101.7 and took tylenol. The temperature started to come down before tylenol.  Sandi Mealy, PA to seen antibiotic into pharmacy.

## 2018-02-15 NOTE — Progress Notes (Signed)
Nutrition follow-up completed with patient in infusion room. Patient is receiving radiation therapy for tongue cancer.  Final treatment scheduled for May 30. Last weight documented was 201.6 pounds on May 7.  Weight on May 3 was 201.7 pounds. Patient denies nutrition impact symptoms. Wife reports patient will only accept 5-1/2-6 bottles of Jevity 1.5. Wife reports the [place] is sending a new enteral formula. Noted glucose 86 on May 7.  Estimated nutrition needs: 2300-2500 cal, 115-130 g protein, 2.4 L fluid.  Nutrition diagnosis: Inadequate oral intake continues.  Intervention: Await different enteral formula from the [facility] to determine tolerance and goal rate. Educated patient to continue to strive to increase current feeds to goal rate.  Monitoring, evaluation, goals: Patient will tolerate increased tube feedings to minimize weight loss.  Next visit: To be scheduled.  **Disclaimer: This note was dictated with voice recognition software. Similar sounding words can inadvertently be transcribed and this note may contain transcription errors which may not have been corrected upon publication of note.**

## 2018-02-15 NOTE — Patient Instructions (Signed)
[facility] Discharge Instructions for Patients Receiving Chemotherapy  Today you received the following chemotherapy agents: Paclitaxel and Carboplatin.  To help prevent nausea and vomiting after your treatment, we encourage you to take your nausea medication as directed.   If you develop nausea and vomiting that is not controlled by your nausea medication, call the clinic.   BELOW ARE SYMPTOMS THAT SHOULD BE REPORTED IMMEDIATELY:  *FEVER GREATER THAN 100.5 F  *CHILLS WITH OR WITHOUT FEVER  NAUSEA AND VOMITING THAT IS NOT CONTROLLED WITH YOUR NAUSEA MEDICATION  *UNUSUAL SHORTNESS OF BREATH  *UNUSUAL BRUISING OR BLEEDING  TENDERNESS IN MOUTH AND THROAT WITH OR WITHOUT PRESENCE OF ULCERS  *URINARY PROBLEMS  *BOWEL PROBLEMS  UNUSUAL RASH Items with * indicate a potential emergency and should be followed up as soon as possible.  Feel free to call the clinic should you have any questions or concerns. The clinic phone number is [phone].  Please show the CHEMO ALERT CARD at check-in to the Emergency Department and triage nurse.   

## 2018-02-15 NOTE — Progress Notes (Signed)
[facility] Progress Note   Edgar Herrera [id number] 08-Aug-1946 72 y.o.  [place] [name] is managed by Dr. 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  Squamous cell carcinoma of the base of the tongue: The patient continues on concurrent chemoradiation with carboplatin and paclitaxel dose today.  Please see After Visit Summary for patient specific instructions.  Future Appointments  Date Time Provider Terry  02/18/2018  9:20 AM [facility] LINAC 4 [facility] None  02/19/2018  9:20 AM [facility] LINAC 4 [facility] None  02/20/2018  9:20 AM [facility] LINAC 4 [facility] None  02/21/2018  9:20 AM [facility] LINAC 4 [facility] None  02/22/2018  9:00 AM [facility] LAB 4 [facility] None  02/22/2018  9:20 AM [facility] LINAC 4 [facility] None  02/22/2018 10:00 AM Perlov, Marinell Blight, MD [facility] None  02/22/2018 11:00 AM [facility] E14 [facility] None  02/25/2018  9:20 AM [facility] LINAC 4 [facility] None  02/25/2018  1:15 PM Sharen Counter, CCC-SLP [facility]  02/26/2018  9:20 AM [facility] LINAC 4 [facility] None  02/27/2018  9:30 AM [facility] LINAC 4 [facility] None  02/28/2018  9:20 AM [facility] LINAC 4 [facility] None  03/01/2018  9:20 AM [facility] LINAC 4 [facility] None  03/01/2018 10:00 AM [facility] LAB 3 [facility] None  03/01/2018 10:20 AM Perlov, Marinell Blight, MD [facility] None  03/01/2018 11:00 AM [facility] H30 [facility] None  03/05/2018  9:20 AM [facility] LINAC 4 [facility] None  03/06/2018  9:20 AM [facility] LINAC 4 [facility] None  03/07/2018  9:20 AM [facility]  LINAC 4 [facility] None  04/01/2018 11:15 AM Lenn Cal, DDS [facility] None    No orders of the defined types were placed in this encounter.      Subjective:   Patient ID:  Edgar Herrera is a 72 y.o. (DOB Jan 28, 1946) male.  Chief Complaint: No chief complaint on file.   HPI [name] is a 72 year old male with a diagnosis of a squamous cell carcinoma of the base of the tongue who is managed by Dr. Lebron Conners.  The patient is being treated with concurrent chemoradiation and is to receive carboplatin and paclitaxel today.  He presents to the clinic today with his wife who reports a several day history of fevers of up to 100.7 at night.  He has been using Tylenol intermittently with his elevated temperature normalizing after dosing.  He has oral tenderness with mucositis for which he continues to use Magic mouthwash.  He has difficulty swallowing secondary to radiation and continues using a feeding tube.  He denies a cough, sinus pressure, sinus pain, or dysuria.  Medications: I have reviewed the patient's current medications.  Allergies: No Known Allergies  Past Medical History:  Diagnosis Date  . Allergic rhinitis   . Atrial fibrillation ([place])   . Diabetes mellitus without complication ([place])   . Hyperlipidemia   . Hypertension   . Prostate cancer [facility])     Past Surgical History:  Procedure Laterality Date  . KNEE SURGERY Bilateral    2007 and 1998,   . PROSTATECTOMY  12/15/2006  . SKIN GRAFT     as a child left elbow  . TONSILECTOMY/ADENOIDECTOMY WITH MYRINGOTOMY  as a child    Family History  Problem Relation Age of Onset  . Lung cancer Mother   . Hypertension Mother   . Breast cancer Mother   . Heart attack Father   . Hypertension Sister   . Hypertension Brother   . Hypertension Sister     Social History   Socioeconomic History  . Marital status: Married    Spouse name: Not on file  . Number of children: Not on file  . Years of education: Not on file    . Highest education level: Not on file  Occupational History  . Not on file  Social Needs  . Financial resource strain: Not on file  . Food insecurity:    Worry: Not on file    Inability: Not on file  . Transportation needs:    Medical: Not on file    Non-medical: Not on file  Tobacco Use  . Smoking status: Never Smoker  . Smokeless tobacco: Never Used  Substance and Sexual Activity  . Alcohol use: Yes    Comment: reports occasional beer or wine. maybe two times monthly  . Drug use: No  . Sexual activity: Not on file  Lifestyle  . Physical activity:    Days per week: Not on file    Minutes per session: Not on file  . Stress: Not on file  Relationships  . Social connections:    Talks on phone: Not on file    Gets together: Not on file    Attends religious service: Not on file    Active member of club or organization: Not on file    Attends meetings of clubs or organizations: Not on file    Relationship status: Not on file  . Intimate partner violence:    Fear of current or ex partner: Not on file    Emotionally abused: Not on file    Physically abused: Not on file    Forced sexual activity: Not on file  Other Topics Concern  . Not on file  Social History Narrative  . Not on file    Past Medical History, Surgical history, Social history, and Family history were reviewed and updated as appropriate.   Please see review of systems for further details on the patient's review from today.   Review of Systems:  Review of Systems  Constitutional: Positive for appetite change and fever. Negative for activity change, chills, diaphoresis and fatigue.  HENT: Positive for mouth sores and trouble swallowing.   Respiratory: Negative for cough, choking and shortness of breath.   Cardiovascular: Negative for chest pain.  Gastrointestinal: Negative for nausea and vomiting.    Objective:   Physical Exam:  There were no vitals taken for this visit. ECOG: 1  Physical Exam   HENT:  Head:    Mouth/Throat:    Cardiovascular: Normal rate, regular rhythm and normal heart sounds. Exam reveals no friction rub.  No murmur heard. Pulmonary/Chest: Effort normal. No respiratory distress. He has no wheezes.  Diffuse coarse breath sounds were noted in the right lower lung field.  Abdominal: Soft. Bowel sounds are normal. He exhibits no distension and no mass. There is no tenderness. There is no guarding.    Skin: Skin is warm and dry.  Psychiatric: He has a normal mood and affect. His behavior is normal. Judgment and thought content normal.    Lab Review:     Component Value Date/Time   NA 139 02/12/2018 0934   K 4.3 02/12/2018  0934   CL 99 02/12/2018 0934   CO2 34 (H) 02/12/2018 0934   GLUCOSE 86 02/12/2018 0934   BUN 29 (H) 02/12/2018 0934   CREATININE 1.19 02/12/2018 0934   CALCIUM 9.9 02/12/2018 0934   PROT 7.1 02/12/2018 0934   ALBUMIN 3.5 02/12/2018 0934   AST 26 02/12/2018 0934   ALT 32 02/12/2018 0934   ALKPHOS 70 02/12/2018 0934   BILITOT 0.5 02/12/2018 0934   GFRNONAA 59 (L) 02/12/2018 0934   GFRAA >60 02/12/2018 0934       Component Value Date/Time   WBC 5.7 02/12/2018 0934   WBC 4.2 02/05/2018 0543   RBC 4.37 02/12/2018 0934   HGB 12.6 (L) 02/12/2018 0934   HCT 38.4 02/12/2018 0934   PLT 160 02/12/2018 0934   MCV 87.9 02/12/2018 0934   MCH 28.8 02/12/2018 0934   MCHC 32.8 02/12/2018 0934   RDW 13.8 02/12/2018 0934   LYMPHSABS 0.5 (L) 02/12/2018 0934   MONOABS 0.4 02/12/2018 0934   EOSABS 0.0 02/12/2018 0934   BASOSABS 0.0 02/12/2018 0934   -------------------------------  Imaging from last 24 hours (if applicable):  Radiology interpretation: No results found.

## 2018-02-18 ENCOUNTER — Other Ambulatory Visit: Payer: Self-pay | Admitting: Radiation Oncology

## 2018-02-18 ENCOUNTER — Ambulatory Visit
Admission: RE | Admit: 2018-02-18 | Discharge: 2018-02-18 | Disposition: A | Discharge status: Home / Self Care | Payer: No Typology Code available for payment source | Source: Ambulatory Visit | Attending: Radiation Oncology | Admitting: Radiation Oncology

## 2018-02-18 DIAGNOSIS — C01 Malignant neoplasm of base of tongue: Secondary | ICD-10-CM

## 2018-02-18 DIAGNOSIS — Z510 Encounter for antineoplastic radiation therapy: Secondary | ICD-10-CM | POA: Diagnosis not present

## 2018-02-18 MED ORDER — LORAZEPAM 0.5 MG PO TABS: 0.5000 mg | ORAL_TABLET | Freq: Four times a day (QID) | ORAL | 0 refills | Status: DC | PRN

## 2018-02-19 ENCOUNTER — Ambulatory Visit
Admission: RE | Admit: 2018-02-19 | Discharge: 2018-02-19 | Disposition: A | Discharge status: Home / Self Care | Payer: No Typology Code available for payment source | Source: Ambulatory Visit | Attending: Radiation Oncology | Admitting: Radiation Oncology

## 2018-02-19 DIAGNOSIS — Z510 Encounter for antineoplastic radiation therapy: Secondary | ICD-10-CM | POA: Diagnosis not present

## 2018-02-20 ENCOUNTER — Ambulatory Visit
Admission: RE | Admit: 2018-02-20 | Discharge: 2018-02-20 | Disposition: A | Discharge status: Home / Self Care | Payer: No Typology Code available for payment source | Source: Ambulatory Visit | Attending: Radiation Oncology | Admitting: Radiation Oncology

## 2018-02-20 DIAGNOSIS — Z510 Encounter for antineoplastic radiation therapy: Secondary | ICD-10-CM | POA: Diagnosis not present

## 2018-02-21 ENCOUNTER — Other Ambulatory Visit: Payer: Self-pay

## 2018-02-21 ENCOUNTER — Telehealth: Payer: Self-pay

## 2018-02-21 ENCOUNTER — Telehealth: Payer: Self-pay | Admitting: *Deleted

## 2018-02-21 ENCOUNTER — Emergency Department (HOSPITAL_COMMUNITY): Payer: Medicare Other

## 2018-02-21 ENCOUNTER — Encounter (HOSPITAL_COMMUNITY): Payer: Self-pay

## 2018-02-21 ENCOUNTER — Ambulatory Visit: Payer: No Typology Code available for payment source

## 2018-02-21 ENCOUNTER — Inpatient Hospital Stay (HOSPITAL_COMMUNITY)
Admission: EM | Admit: 2018-02-21 | Discharge: 2018-03-01 | DRG: 872 | Disposition: A | Discharge status: Home / Self Care | Payer: Medicare Other | Attending: Family Medicine | Admitting: Family Medicine

## 2018-02-21 ENCOUNTER — Ambulatory Visit
Admission: RE | Admit: 2018-02-21 | Discharge: 2018-02-21 | Disposition: A | Discharge status: Home / Self Care | Payer: No Typology Code available for payment source | Source: Ambulatory Visit | Attending: Radiation Oncology | Admitting: Radiation Oncology

## 2018-02-21 ENCOUNTER — Encounter: Payer: Self-pay | Admitting: *Deleted

## 2018-02-21 DIAGNOSIS — R509 Fever, unspecified: Secondary | ICD-10-CM | POA: Diagnosis not present

## 2018-02-21 DIAGNOSIS — R4702 Dysphasia: Secondary | ICD-10-CM | POA: Diagnosis not present

## 2018-02-21 DIAGNOSIS — D709 Neutropenia, unspecified: Secondary | ICD-10-CM

## 2018-02-21 DIAGNOSIS — A419 Sepsis, unspecified organism: Principal | ICD-10-CM

## 2018-02-21 DIAGNOSIS — E1165 Type 2 diabetes mellitus with hyperglycemia: Secondary | ICD-10-CM | POA: Diagnosis present

## 2018-02-21 DIAGNOSIS — K121 Other forms of stomatitis: Secondary | ICD-10-CM | POA: Diagnosis present

## 2018-02-21 DIAGNOSIS — I10 Essential (primary) hypertension: Secondary | ICD-10-CM | POA: Diagnosis present

## 2018-02-21 DIAGNOSIS — G893 Neoplasm related pain (acute) (chronic): Secondary | ICD-10-CM | POA: Diagnosis not present

## 2018-02-21 DIAGNOSIS — D630 Anemia in neoplastic disease: Secondary | ICD-10-CM | POA: Diagnosis not present

## 2018-02-21 DIAGNOSIS — Z931 Gastrostomy status: Secondary | ICD-10-CM | POA: Diagnosis not present

## 2018-02-21 DIAGNOSIS — C140 Malignant neoplasm of pharynx, unspecified: Secondary | ICD-10-CM | POA: Diagnosis present

## 2018-02-21 DIAGNOSIS — Z79899 Other long term (current) drug therapy: Secondary | ICD-10-CM | POA: Diagnosis not present

## 2018-02-21 DIAGNOSIS — E118 Type 2 diabetes mellitus with unspecified complications: Secondary | ICD-10-CM | POA: Diagnosis not present

## 2018-02-21 DIAGNOSIS — I4891 Unspecified atrial fibrillation: Secondary | ICD-10-CM | POA: Diagnosis not present

## 2018-02-21 DIAGNOSIS — E876 Hypokalemia: Secondary | ICD-10-CM | POA: Diagnosis not present

## 2018-02-21 DIAGNOSIS — E785 Hyperlipidemia, unspecified: Secondary | ICD-10-CM

## 2018-02-21 DIAGNOSIS — L309 Dermatitis, unspecified: Secondary | ICD-10-CM | POA: Diagnosis not present

## 2018-02-21 DIAGNOSIS — N179 Acute kidney failure, unspecified: Secondary | ICD-10-CM | POA: Diagnosis present

## 2018-02-21 DIAGNOSIS — E871 Hypo-osmolality and hyponatremia: Secondary | ICD-10-CM | POA: Diagnosis present

## 2018-02-21 DIAGNOSIS — L598 Other specified disorders of the skin and subcutaneous tissue related to radiation: Secondary | ICD-10-CM | POA: Diagnosis present

## 2018-02-21 DIAGNOSIS — B3789 Other sites of candidiasis: Secondary | ICD-10-CM | POA: Diagnosis present

## 2018-02-21 DIAGNOSIS — Z7901 Long term (current) use of anticoagulants: Secondary | ICD-10-CM

## 2018-02-21 DIAGNOSIS — Y842 Radiological procedure and radiotherapy as the cause of abnormal reaction of the patient, or of later complication, without mention of misadventure at the time of the procedure: Secondary | ICD-10-CM | POA: Diagnosis present

## 2018-02-21 DIAGNOSIS — B379 Candidiasis, unspecified: Secondary | ICD-10-CM | POA: Diagnosis not present

## 2018-02-21 DIAGNOSIS — D61818 Other pancytopenia: Secondary | ICD-10-CM | POA: Diagnosis present

## 2018-02-21 DIAGNOSIS — C760 Malignant neoplasm of head, face and neck: Secondary | ICD-10-CM | POA: Diagnosis not present

## 2018-02-21 DIAGNOSIS — L539 Erythematous condition, unspecified: Secondary | ICD-10-CM | POA: Diagnosis not present

## 2018-02-21 DIAGNOSIS — Z794 Long term (current) use of insulin: Secondary | ICD-10-CM

## 2018-02-21 DIAGNOSIS — Z9221 Personal history of antineoplastic chemotherapy: Secondary | ICD-10-CM | POA: Diagnosis not present

## 2018-02-21 DIAGNOSIS — R5081 Fever presenting with conditions classified elsewhere: Secondary | ICD-10-CM | POA: Diagnosis present

## 2018-02-21 DIAGNOSIS — C01 Malignant neoplasm of base of tongue: Secondary | ICD-10-CM | POA: Diagnosis not present

## 2018-02-21 DIAGNOSIS — I480 Paroxysmal atrial fibrillation: Secondary | ICD-10-CM | POA: Diagnosis present

## 2018-02-21 DIAGNOSIS — Z85810 Personal history of malignant neoplasm of tongue: Secondary | ICD-10-CM

## 2018-02-21 DIAGNOSIS — Z8546 Personal history of malignant neoplasm of prostate: Secondary | ICD-10-CM | POA: Diagnosis not present

## 2018-02-21 DIAGNOSIS — Z8589 Personal history of malignant neoplasm of other organs and systems: Secondary | ICD-10-CM | POA: Diagnosis not present

## 2018-02-21 DIAGNOSIS — E860 Dehydration: Secondary | ICD-10-CM | POA: Diagnosis present

## 2018-02-21 DIAGNOSIS — T451X5A Adverse effect of antineoplastic and immunosuppressive drugs, initial encounter: Secondary | ICD-10-CM | POA: Diagnosis present

## 2018-02-21 DIAGNOSIS — D696 Thrombocytopenia, unspecified: Secondary | ICD-10-CM | POA: Diagnosis not present

## 2018-02-21 DIAGNOSIS — M109 Gout, unspecified: Secondary | ICD-10-CM | POA: Diagnosis present

## 2018-02-21 DIAGNOSIS — B370 Candidal stomatitis: Secondary | ICD-10-CM | POA: Diagnosis present

## 2018-02-21 DIAGNOSIS — I4892 Unspecified atrial flutter: Secondary | ICD-10-CM | POA: Diagnosis present

## 2018-02-21 DIAGNOSIS — E119 Type 2 diabetes mellitus without complications: Secondary | ICD-10-CM

## 2018-02-21 HISTORY — DX: Type 2 diabetes mellitus without complications: E11.9

## 2018-02-21 HISTORY — DX: Hyperlipidemia, unspecified: E78.5

## 2018-02-21 LAB — URINALYSIS, ROUTINE W REFLEX MICROSCOPIC
BACTERIA UA: NONE SEEN
Bilirubin Urine: NEGATIVE
Glucose, UA: 150 mg/dL — AB
Hgb urine dipstick: NEGATIVE
Ketones, ur: NEGATIVE mg/dL
Leukocytes, UA: NEGATIVE
Nitrite: NEGATIVE
PROTEIN: 30 mg/dL — AB
SPECIFIC GRAVITY, URINE: 1.02 (ref 1.005–1.030)
pH: 6 (ref 5.0–8.0)

## 2018-02-21 LAB — CBC WITH DIFFERENTIAL/PLATELET
BASOS PCT: 0 %
Basophils Absolute: 0 10*3/uL (ref 0.0–0.1)
EOS ABS: 0 10*3/uL (ref 0.0–0.7)
Eosinophils Relative: 0 %
HCT: 30.9 % — ABNORMAL LOW (ref 39.0–52.0)
HEMOGLOBIN: 10.3 g/dL — AB (ref 13.0–17.0)
LYMPHS ABS: 0.2 10*3/uL — AB (ref 0.7–4.0)
Lymphocytes Relative: 18 %
MCH: 28.6 pg (ref 26.0–34.0)
MCHC: 33.3 g/dL (ref 30.0–36.0)
MCV: 85.8 fL (ref 78.0–100.0)
MONO ABS: 0.1 10*3/uL (ref 0.1–1.0)
MONOS PCT: 7 %
NEUTROS PCT: 75 %
Neutro Abs: 0.9 10*3/uL — ABNORMAL LOW (ref 1.7–7.7)
PLATELETS: 114 10*3/uL — AB (ref 150–400)
RBC: 3.6 MIL/uL — ABNORMAL LOW (ref 4.22–5.81)
RDW: 14.1 % (ref 11.5–15.5)
WBC: 1.2 10*3/uL — CL (ref 4.0–10.5)

## 2018-02-21 LAB — COMPREHENSIVE METABOLIC PANEL
ALBUMIN: 2.7 g/dL — AB (ref 3.5–5.0)
ALK PHOS: 51 U/L (ref 38–126)
ALT: 22 U/L (ref 17–63)
AST: 15 U/L (ref 15–41)
Anion gap: 9 (ref 5–15)
BUN: 39 mg/dL — ABNORMAL HIGH (ref 6–20)
CALCIUM: 8.4 mg/dL — AB (ref 8.9–10.3)
CHLORIDE: 97 mmol/L — AB (ref 101–111)
CO2: 26 mmol/L (ref 22–32)
CREATININE: 1.22 mg/dL (ref 0.61–1.24)
GFR calc Af Amer: >60 mL/min (ref >60)
GFR calc non Af Amer: 57 mL/min — ABNORMAL LOW (ref >60)
GLUCOSE: 255 mg/dL — AB (ref 65–99)
Potassium: 5.3 mmol/L — ABNORMAL HIGH (ref 3.5–5.1)
SODIUM: 132 mmol/L — AB (ref 135–145)
Total Bilirubin: 0.9 mg/dL (ref 0.3–1.2)
Total Protein: 6.2 g/dL — ABNORMAL LOW (ref 6.5–8.1)

## 2018-02-21 LAB — GLUCOSE, CAPILLARY
Glucose-Capillary: 171 mg/dL — ABNORMAL HIGH (ref 65–99)
Glucose-Capillary: 254 mg/dL — ABNORMAL HIGH (ref 65–99)

## 2018-02-21 LAB — I-STAT CG4 LACTIC ACID, ED: LACTIC ACID, VENOUS: 1.37 mmol/L (ref 0.5–1.9)

## 2018-02-21 MED ORDER — VITAMIN B-12 1000 MCG PO TABS
500.0000 ug | ORAL_TABLET | Freq: Two times a day (BID) | ORAL | Status: DC
  Filled 2018-02-21: qty 1

## 2018-02-21 MED ORDER — MORPHINE SULFATE (PF) 4 MG/ML IV SOLN
4.0000 mg | INTRAVENOUS | Status: DC | PRN
  Administered 2018-02-21 – 2018-02-28 (×7): 4 mg via INTRAVENOUS
  Filled 2018-02-21 (×7): qty 1

## 2018-02-21 MED ORDER — ALLOPURINOL 100 MG PO TABS
100.0000 mg | ORAL_TABLET | Freq: Every day | ORAL | Status: DC
  Filled 2018-02-21: qty 1

## 2018-02-21 MED ORDER — SODIUM CHLORIDE 0.9 % IV BOLUS
30.0000 mL/kg | Freq: Once | INTRAVENOUS | Status: AC
  Administered 2018-02-21: 2667 mL via INTRAVENOUS

## 2018-02-21 MED ORDER — PRAVASTATIN SODIUM 20 MG PO TABS
40.0000 mg | ORAL_TABLET | Freq: Every day | ORAL | Status: DC
  Administered 2018-02-22 – 2018-03-01 (×8): 40 mg
  Filled 2018-02-21 (×8): qty 2

## 2018-02-21 MED ORDER — VITAMIN B-12 1000 MCG PO TABS
500.0000 ug | ORAL_TABLET | Freq: Two times a day (BID) | ORAL | Status: DC
  Administered 2018-02-21 – 2018-03-01 (×16): 500 ug
  Filled 2018-02-21 (×16): qty 1

## 2018-02-21 MED ORDER — AMLODIPINE BESYLATE 10 MG PO TABS
10.0000 mg | ORAL_TABLET | Freq: Every day | ORAL | Status: DC
  Filled 2018-02-21: qty 1

## 2018-02-21 MED ORDER — HYDROCHLOROTHIAZIDE 25 MG PO TABS
12.5000 mg | ORAL_TABLET | Freq: Every day | ORAL | Status: DC
  Filled 2018-02-21: qty 1

## 2018-02-21 MED ORDER — SYSTANE CONTACTS OP SOLN: 1.0000 [drp] | Freq: Every day | OPHTHALMIC | Status: DC

## 2018-02-21 MED ORDER — ACETAMINOPHEN 325 MG PO TABS: 650.0000 mg | ORAL_TABLET | Freq: Four times a day (QID) | ORAL | Status: DC | PRN

## 2018-02-21 MED ORDER — SODIUM CHLORIDE 0.9 % IV SOLN: 2.0000 g | Freq: Three times a day (TID) | INTRAVENOUS | Status: DC

## 2018-02-21 MED ORDER — ALLOPURINOL 100 MG PO TABS
100.0000 mg | ORAL_TABLET | Freq: Every day | ORAL | Status: DC
  Administered 2018-02-22 – 2018-03-01 (×8): 100 mg
  Filled 2018-02-21 (×9): qty 1

## 2018-02-21 MED ORDER — APIXABAN 5 MG PO TABS
5.0000 mg | ORAL_TABLET | Freq: Two times a day (BID) | ORAL | Status: DC
  Administered 2018-02-21: 5 mg via ORAL
  Filled 2018-02-21 (×2): qty 1

## 2018-02-21 MED ORDER — POLYVINYL ALCOHOL 1.4 % OP SOLN
1.0000 [drp] | OPHTHALMIC | Status: DC | PRN
  Filled 2018-02-21: qty 15

## 2018-02-21 MED ORDER — SENNOSIDES 8.8 MG/5ML PO SYRP
5.0000 mL | ORAL_SOLUTION | Freq: Two times a day (BID) | ORAL | Status: DC
  Administered 2018-02-21: 5 mL
  Filled 2018-02-21 (×8): qty 5

## 2018-02-21 MED ORDER — SODIUM CHLORIDE 0.9 % IV SOLN: INTRAVENOUS | Status: AC

## 2018-02-21 MED ORDER — SODIUM CHLORIDE 0.9 % IV BOLUS
1000.0000 mL | Freq: Once | INTRAVENOUS | Status: AC
  Administered 2018-02-21: 1000 mL via INTRAVENOUS

## 2018-02-21 MED ORDER — ACETAMINOPHEN 650 MG RE SUPP
650.0000 mg | RECTAL | Status: DC | PRN
  Administered 2018-02-22: 650 mg via RECTAL
  Filled 2018-02-21 (×2): qty 1

## 2018-02-21 MED ORDER — ONDANSETRON HCL 4 MG PO TABS: 4.0000 mg | ORAL_TABLET | Freq: Four times a day (QID) | ORAL | Status: DC | PRN

## 2018-02-21 MED ORDER — SODIUM CHLORIDE 0.9 % IV SOLN
2.0000 g | Freq: Three times a day (TID) | INTRAVENOUS | Status: DC
  Administered 2018-02-21 – 2018-02-25 (×12): 2 g via INTRAVENOUS
  Filled 2018-02-21 (×14): qty 2

## 2018-02-21 MED ORDER — ACETAMINOPHEN 160 MG/5ML PO SOLN
650.0000 mg | Freq: Four times a day (QID) | ORAL | Status: DC | PRN
  Administered 2018-02-23 – 2018-02-28 (×5): 650 mg via ORAL
  Filled 2018-02-21 (×5): qty 20.3

## 2018-02-21 MED ORDER — ACETAMINOPHEN 650 MG RE SUPP: 650.0000 mg | Freq: Four times a day (QID) | RECTAL | Status: DC | PRN

## 2018-02-21 MED ORDER — DOCUSATE SODIUM 50 MG/5ML PO LIQD
100.0000 mg | Freq: Two times a day (BID) | ORAL | Status: DC
  Administered 2018-02-21: 100 mg
  Filled 2018-02-21 (×8): qty 10

## 2018-02-21 MED ORDER — DOCUSATE SODIUM 100 MG PO CAPS: 100.0000 mg | ORAL_CAPSULE | Freq: Two times a day (BID) | ORAL | Status: DC

## 2018-02-21 MED ORDER — VANCOMYCIN HCL 10 G IV SOLR
1250.0000 mg | INTRAVENOUS | Status: DC
  Administered 2018-02-21: 1250 mg via INTRAVENOUS
  Filled 2018-02-21 (×2): qty 1250

## 2018-02-21 MED ORDER — INSULIN ASPART 100 UNIT/ML ~~LOC~~ SOLN
0.0000 [IU] | Freq: Three times a day (TID) | SUBCUTANEOUS | Status: DC
  Administered 2018-02-22: 2 [IU] via SUBCUTANEOUS
  Administered 2018-02-22: 3 [IU] via SUBCUTANEOUS
  Administered 2018-02-22: 2 [IU] via SUBCUTANEOUS
  Administered 2018-02-23 (×2): 3 [IU] via SUBCUTANEOUS
  Administered 2018-02-23: 5 [IU] via SUBCUTANEOUS
  Administered 2018-02-24: 7 [IU] via SUBCUTANEOUS

## 2018-02-21 MED ORDER — JEVITY 1.2 CAL PO LIQD
237.0000 mL | ORAL | Status: DC
  Administered 2018-02-21 – 2018-02-22 (×3): 237 mL

## 2018-02-21 MED ORDER — HYDROCODONE-ACETAMINOPHEN 7.5-325 MG/15ML PO SOLN: 10.0000 mL | Freq: Four times a day (QID) | ORAL | Status: DC | PRN

## 2018-02-21 MED ORDER — GUAIFENESIN 100 MG/5ML PO SOLN
5.0000 mL | ORAL | Status: DC | PRN
  Administered 2018-02-21 – 2018-02-24 (×2): 100 mg via ORAL
  Filled 2018-02-21 (×2): qty 10

## 2018-02-21 MED ORDER — AMLODIPINE BESYLATE 10 MG PO TABS
10.0000 mg | ORAL_TABLET | Freq: Every day | ORAL | Status: DC
  Administered 2018-02-22 – 2018-03-01 (×8): 10 mg
  Filled 2018-02-21 (×9): qty 1

## 2018-02-21 MED ORDER — VANCOMYCIN HCL IN DEXTROSE 1-5 GM/200ML-% IV SOLN
1000.0000 mg | Freq: Once | INTRAVENOUS | Status: AC
  Administered 2018-02-21: 1000 mg via INTRAVENOUS
  Filled 2018-02-21: qty 200

## 2018-02-21 MED ORDER — HYDROCHLOROTHIAZIDE 25 MG PO TABS
12.5000 mg | ORAL_TABLET | Freq: Every day | ORAL | Status: DC
  Administered 2018-02-22 – 2018-03-01 (×8): 12.5 mg
  Filled 2018-02-21 (×8): qty 1

## 2018-02-21 MED ORDER — ONDANSETRON HCL 4 MG/2ML IJ SOLN
4.0000 mg | Freq: Four times a day (QID) | INTRAMUSCULAR | Status: DC | PRN
  Administered 2018-02-21: 4 mg via INTRAVENOUS
  Filled 2018-02-21: qty 2

## 2018-02-21 MED ORDER — DOCUSATE SODIUM 50 MG/5ML PO LIQD: 100.0000 mg | Freq: Two times a day (BID) | ORAL | Status: DC

## 2018-02-21 MED ORDER — POLYETHYLENE GLYCOL 3350 17 G PO PACK: 17.0000 g | PACK | Freq: Every day | ORAL | Status: DC | PRN

## 2018-02-21 MED ORDER — SENNA 8.6 MG PO TABS
1.0000 | ORAL_TABLET | Freq: Two times a day (BID) | ORAL | Status: DC
  Filled 2018-02-21: qty 1

## 2018-02-21 MED ORDER — SODIUM CHLORIDE 0.9 % IV SOLN
2.0000 g | Freq: Once | INTRAVENOUS | Status: AC
  Administered 2018-02-21: 2 g via INTRAVENOUS
  Filled 2018-02-21: qty 2

## 2018-02-21 MED ORDER — APIXABAN 5 MG PO TABS
5.0000 mg | ORAL_TABLET | Freq: Two times a day (BID) | ORAL | Status: DC
  Administered 2018-02-21 – 2018-03-01 (×16): 5 mg
  Filled 2018-02-21 (×16): qty 1

## 2018-02-21 MED ORDER — LORAZEPAM 0.5 MG PO TABS
0.5000 mg | ORAL_TABLET | Freq: Four times a day (QID) | ORAL | Status: DC | PRN
  Administered 2018-02-26 – 2018-03-01 (×2): 0.5 mg via ORAL
  Filled 2018-02-21 (×3): qty 1

## 2018-02-21 MED ORDER — PRAVASTATIN SODIUM 20 MG PO TABS
40.0000 mg | ORAL_TABLET | Freq: Every day | ORAL | Status: DC
  Filled 2018-02-21: qty 2

## 2018-02-21 NOTE — Consult Note (Signed)
IP PROGRESS NOTE  Subjective:  Edgar Herrera is a 72 y.o. 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  Patient presented to the emergency room after developing febrile illness at home.  Recorded temperature at the emergency room up to 102.  Patient felt tired, lethargic, but otherwise had no new symptoms to localize potential infection.  Chronic cough without change in the sputum production.  No chest pain, diarrhea, dysuria, or hematuria.  In the emergency room, patient was found to be neutropenic with absolute neutrophil count of 0.9.  Due to neutropenic fever, patient was admitted to the hospital for parenteral antibiotic therapy and was started on vancomycin and cefepime.  Feeling reasonably well at this time.  Pain is well controlled.  Denies rigors or chills.  No shortness of breath, diarrhea, or dysuria.  Patient does have some dermatitis consistent with radiation.  Objective: Vital signs in last 24 hours: Blood pressure (!) 117/93, pulse (!) 112, temperature 99.7 F (37.6 C), temperature source Oral, resp. rate 17, height 5\' 9"  (1.753 m), weight 197 lb (89.4 kg), SpO2 97 %.  Intake/Output from previous day: No intake/output data recorded.  Physical Exam: Alert, awake, oriented x3. HEENT: Radiation dermatitis without palpable fluctuance in the soft tissues Lungs: Clear to auscultation bilaterally. Cardiac: S1/S2, regular.  No murmurs, rubs, gallops. Abdomen: Soft, nontender, nondistended.   Extremities: No lower extremity edema Portacath: without erythema  Lab Results: Recent Labs    02/21/18 0642  WBC 1.2*  HGB 10.3*  HCT 30.9*  PLT 114*    BMET Recent Labs    02/21/18 0642  NA 132*  K 5.3*  CL 97*  CO2 26  GLUCOSE 255*  BUN 39*  CREATININE 1.22  CALCIUM 8.4*    No results found for: CEA1  Studies/Results: Dg Chest Portable 1 View  Result Date: 02/21/2018 CLINICAL DATA:  Throat cancer.   Coughing up red phlegm. EXAM: PORTABLE CHEST 1 VIEW COMPARISON:  Outside PET-CT 12/24/2017. FINDINGS: Left subclavian PowerPort catheter noted with tip in the right atrium. Cardiomegaly with normal pulmonary vascularity. No focal infiltrate. No pleural effusion pneumothorax. Mild elevation left hemidiaphragm. No acute bony abnormality. Mediastinum and hilar structures are normal. IMPRESSION: One PowerPort catheter noted with tip over the right atrium. 2.  Cardiomegaly.  No pulmonary venous congestion. 3.  No acute pulmonary disease. Electronically Signed   By: Marcello Moores  Register   On: 02/21/2018 07:41    Medications: I have reviewed the patient's current medications.  Assessment/Plan: 72 y.o. Male undergoing radiation therapy with concurrent systemic treatment with carboplatin and paclitaxel for diagnosis of squamous cell carcinoma of head and neck.  Admitted for neutropenic fever with ANC of 0.9 and temperature 102.  Presently, hemodynamically stable after receiving IV fluids.  At the present time, no localizing signs for infection.  Recommendations: - Agree with the current choice of antibiotics. - Will hold systemic chemotherapy until patient recovers the neutrophil count and is no longer febrile.  Most likely, will resume systemic chemotherapy next week. - Continuation of radiation therapy at the discretion of the primary treating radiation oncologist.   LOS: 0 days   Ardath Sax, MD   02/21/2018, 8:33 PM

## 2018-02-21 NOTE — ED Triage Notes (Signed)
Per EMS: Pt has throat CA and is receiving radiation.  Pt had a coughing fit with over production of red tinged phlegm.  Pt had c/o of weakness and fever when he called. Temperature was 102.  Pt received 1000 mg of tylenol.  Pt has a PEG tube.

## 2018-02-21 NOTE — Progress Notes (Addendum)
Pharmacy Antibiotic Note  Edgar Herrera is a 72 y.o. male with tongue cancer currently undergoing XRT + chemotherapy presented to the ED on 02/21/2018 with c/o fever and weakness.  To start vancomycin for febrile neutropenia/sepsis.  - Tmax 102, wbc low, scr 1.22 (crcl~59), ANC 0.9  Plan: - vancomycin 1000 mg IV ordered to be given in the ED, then Vancomycin 1250mg  IV q24h for est AUC 441 (give 12 hrs after 1000 mg dose) - changed cefepime dose to 2mg  IV q8h for febrile neutropenia infication - monitor renal function ______________________________________  Height: 5\' 9"  (175.3 cm) Weight: 196 lb (88.9 kg) IBW/kg (Calculated) : 70.7  Temp (24hrs), Avg:102 F (38.9 C), Min:102 F (38.9 C), Max:102 F (38.9 C)  Recent Labs  Lab 02/21/18 0642 02/21/18 0650  WBC 1.2*  --   CREATININE 1.22  --   LATICACIDVEN  --  1.37    Estimated Creatinine Clearance: 59.5 mL/min (by C-G formula based on SCr of 1.22 mg/dL).    No Known Allergies   Thank you for allowing pharmacy to be a part of this patient's care.  Lynelle Doctor 02/21/2018 9:25 AM

## 2018-02-21 NOTE — Progress Notes (Signed)
[facility] Therapy Treatment Record Phone [phone]   Radiation Therapy was administered to [name] on: 02/21/2018  1:49 PM and was treatment # 26 out of a planned course of 35 treatments.  Radiation Treatment  1). Beam photons with 6-10 energy  2). Brachytherapy None  3). Stereotactic Radiosurgery None  4). Other Radiation None     Edgar Herrera

## 2018-02-21 NOTE — Progress Notes (Signed)
A consult was received from an ED physician for vancomycin per pharmacy dosing.  The patient's profile has been reviewed for ht/wt/allergies/indication/available labs.    A one time order has been placed for vancomycin 1gm IV x1.  Further antibiotics/pharmacy consults should be ordered by admitting physician if indicated.                       Thank you, Lynelle Doctor 02/21/2018  7:58 AM

## 2018-02-21 NOTE — ED Provider Notes (Addendum)
[facility] DEPT Provider Note   CSN: [id number] Arrival date & time: 02/21/18  8786     History   Chief Complaint No chief complaint on file.   HPI Edgar Herrera is a 72 y.o. male.  HPI Patient has history of atrial fibrillation, diabetes, hypertension, squamous cell carcinoma of the base of the tongue.  He is undergoing chemotherapy and radiation therapy.  Patient was seen last week with low-grade fever to 100.7.  He was started on amoxicillin.  Patient's wife reports that yesterday he had some increase in the amount of cough.  At baseline he does have a productive cough due to his throat cancer.  He reports last night he developed chills and became confused.  She tried to assist him to the bathroom and he seemed confused as to what to do.  Speech was disoriented.  He had generalized weakness.  Today he has fever to 102.  She denies he has any pain.  Denies any additional symptoms except as described by his wife with some additional cough. Past Medical History:  Diagnosis Date  . Allergic rhinitis   . Atrial fibrillation ([place])   . Diabetes mellitus without complication ([place])   . Hyperlipidemia   . Hypertension   . Prostate cancer [facility])     Patient Active Problem List   Diagnosis Date Noted  . Squamous cell carcinoma of base of tongue ([place]) 01/01/2018  . Atrial flutter ([place]) 05/14/2017  . Chronic anticoagulation 05/14/2017  . Essential hypertension 05/14/2017  . Paroxysmal atrial fibrillation ([place]) 05/13/2017    Past Surgical History:  Procedure Laterality Date  . KNEE SURGERY Bilateral    2007 and 1998,   . PROSTATECTOMY  12/15/2006  . SKIN GRAFT     as a child left elbow  . TONSILECTOMY/ADENOIDECTOMY WITH MYRINGOTOMY     as a child        Home Medications    Prior to Admission medications   Medication Sig Start Date End Date Taking? Authorizing Provider  allopurinol (ZYLOPRIM) 100 MG tablet Take 100 mg by mouth daily.   Yes  [provider]  amLODipine (NORVASC) 10 MG tablet Take 10 mg by mouth daily.   Yes [provider]  amoxicillin (AMOXIL) 125 MG/5ML suspension Take 500 mg by mouth 2 (two) times daily.   Yes [provider]  apixaban (ELIQUIS) 5 MG TABS tablet Take 5 mg by mouth 2 (two) times daily.   Yes [provider]  Artificial Tear Solution (SYSTANE CONTACTS) SOLN Apply 1 drop to eye daily.   Yes [provider]  cyanocobalamin 500 MCG tablet Take 500 mcg by mouth 2 (two) times daily.   Yes [provider]  dexamethasone (DECADRON) 4 MG tablet Take 2 tablets (8 mg total) by mouth daily. Start the day after chemotherapy for 2 days. 01/08/18  Yes Perlov, Marinell Blight, MD  glipiZIDE (GLUCOTROL) 5 MG tablet Take 2.5-5 mg by mouth 2 (two) times daily before a meal. Take 1 tablet before breakfast and Take 1/2 tablet before dinner.   Yes [provider]  hydrochlorothiazide (HYDRODIURIL) 25 MG tablet Take 12.5 mg by mouth daily.   Yes [provider]  HYDROcodone-acetaminophen (HYCET) 7.5-325 mg/15 ml solution Take 10 mLs by mouth 4 (four) times daily as needed for moderate pain.   Yes [provider]  lidocaine (XYLOCAINE) 2 % solution Patient: Mix 1part 2% viscous lidocaine, 1part H20. Swish & swallow 62mL of diluted mixture, 78min before meals and  at bedtime, up to QID 01/29/18  Yes [name], MD  lidocaine-prilocaine (EMLA) cream Apply to affected area once Patient taking differently: Apply 1 application topically once. Apply to affected area once before chemo 01/08/18  Yes Perlov, Marinell Blight, MD  lisinopril (PRINIVIL,ZESTRIL) 40 MG tablet Take 40 mg by mouth daily.   Yes [provider]  LORazepam (ATIVAN) 0.5 MG tablet Take 1 tablet (0.5 mg total) by mouth every 6 (six) hours as needed (Nausea or vomiting). Patient taking differently: Take 0.5 mg by mouth every 6 (six) hours as needed for anxiety (Nausea and Vomiting).  02/18/18   Yes [name], MD  metFORMIN (GLUCOPHAGE) 1000 MG tablet Take 1,000 mg by mouth 2 (two) times daily with a meal.   Yes [provider]  pravastatin (PRAVACHOL) 40 MG tablet Take 40 mg by mouth daily.   Yes [provider]  sodium fluoride (FLUORISHIELD) 1.1 % GEL dental gel Instill one drop of gel per tooth space of fluoride tray. Place over teeth for 5 minutes. Remove. Spit out excess. Repeat nightly. Patient taking differently: Place 1 application onto teeth at bedtime. Instill one drop of gel per tooth space of fluoride tray. Place over teeth for 5 minutes. Remove. Spit out excess. Repeat nightly. 01/01/18  Yes Lenn Cal, DDS  cephALEXin (KEFLEX) 500 MG capsule Take 1 capsule (500 mg total) by mouth 3 (three) times daily. Patient not taking: Reported on 02/21/2018 02/05/18   Horton, Barbette Hair, MD  Morphine Sulfate (MORPHINE CONCENTRATE) 10 mg / 0.5 ml concentrated solution Take 0.5 mLs (10 mg total) by mouth every 4 (four) hours as needed for up to 14 days for severe pain. Patient not taking: Reported on 02/21/2018 02/08/18 02/22/18  Ardath Sax, MD  ondansetron (ZOFRAN) 8 MG tablet Take 1 tablet (8 mg total) by mouth 2 (two) times daily as needed for refractory nausea / vomiting. Start on day 3 after chemo. Patient not taking: Reported on 02/08/2018 01/08/18   Ardath Sax, MD  prochlorperazine (COMPAZINE) 10 MG tablet Take 1 tablet (10 mg total) by mouth every 6 (six) hours as needed (Nausea or vomiting). Patient not taking: Reported on 02/08/2018 01/08/18   Ardath Sax, MD  sulfamethoxazole-trimethoprim (BACTRIM,SEPTRA) 200-40 MG/5ML suspension Place 20 mLs into feeding tube 2 (two) times daily. Patient not taking: Reported on 02/21/2018 02/15/18   Harle Stanford., PA-C    Family History Family History  Problem Relation Age of Onset  . Lung cancer Mother   . Hypertension Mother   . Breast cancer Mother   . Heart attack Father   . Hypertension Sister   .  Hypertension Brother   . Hypertension Sister     Social History Social History   Tobacco Use  . Smoking status: Never Smoker  . Smokeless tobacco: Never Used  Substance Use Topics  . Alcohol use: Yes    Comment: reports occasional beer or wine. maybe two times monthly  . Drug use: No     Allergies   Patient has no known allergies.   Review of Systems Review of Systems 10 Systems reviewed and are negative for acute change except as noted in the HPI.   Physical Exam Updated Vital Signs BP (!) 105/59   Pulse 96   Temp (!) 102 F (38.9 C) (Rectal)   Resp 16   Ht 5\' 9"  (1.753 m)   Wt 88.9 kg (196 lb)   SpO2 93%   BMI 28.94 kg/m  Physical Exam  Constitutional:  Patient is fatigued in appearance.  No respiratory distress.  He does answer questions appropriately.  He is situationally oriented and following commands.  HENT:  Patient has extensive erythematous skin changes around the entirety of his neck and lower face.  Scaling of skin.  The attached images.  The oropharynx is patent.  No pooling of secretions.  Petechial type erythema of the soft palate posteriorly.  Eyes: EOM are normal.  Cardiovascular:  Borderline tachycardia irregularly irregular.  Pulmonary/Chest:  Breath sounds are soft throughout.  No focal or gross wheeze or rhonchi.  Abdominal: Soft. He exhibits no distension. There is no tenderness. There is no guarding.  Musculoskeletal: Normal range of motion. He exhibits no edema or tenderness.  Neurological: He is alert. He exhibits normal muscle tone. Coordination normal.  Patient follows commands appropriately.  He responds to questions appropriately.  No focal neurologic deficit.  Skin: Skin is warm and dry. Rash noted.         ED Treatments / Results  Labs (all labs ordered are listed, but only abnormal results are displayed) Labs Reviewed  COMPREHENSIVE METABOLIC PANEL - Abnormal; Notable for the following components:      Result Value    Sodium 132 (*)    Potassium 5.3 (*)    Chloride 97 (*)    Glucose, Bld 255 (*)    BUN 39 (*)    Calcium 8.4 (*)    Total Protein 6.2 (*)    Albumin 2.7 (*)    GFR calc non Af Amer 57 (*)    All other components within normal limits  CBC WITH DIFFERENTIAL/PLATELET - Abnormal; Notable for the following components:   WBC 1.2 (*)    RBC 3.60 (*)    Hemoglobin 10.3 (*)    HCT 30.9 (*)    Platelets 114 (*)    Neutro Abs 0.9 (*)    Lymphs Abs 0.2 (*)    All other components within normal limits  CULTURE, BLOOD (ROUTINE X 2)  CULTURE, BLOOD (ROUTINE X 2)  URINALYSIS, ROUTINE W REFLEX MICROSCOPIC  I-STAT CG4 LACTIC ACID, ED    EKG EKG Interpretation  Date/Time:  Thursday Feb 21 2018 06:48:34 EDT Ventricular Rate:  96 PR Interval:    QRS Duration: 92 QT Interval:  338 QTC Calculation: 428 R Axis:   55 Text Interpretation:  SR Abnormal inferior Q waves Minimal ST depression, diffuse leads no acute ischemic changes. no old comparison Confirmed by Charlesetta Shanks [phone]) on 02/21/2018 9:02:43 AM   Radiology Dg Chest Portable 1 View  Result Date: 02/21/2018 CLINICAL DATA:  Throat cancer.  Coughing up red phlegm. EXAM: PORTABLE CHEST 1 VIEW COMPARISON:  Outside PET-CT 12/24/2017. FINDINGS: Left subclavian PowerPort catheter noted with tip in the right atrium. Cardiomegaly with normal pulmonary vascularity. No focal infiltrate. No pleural effusion pneumothorax. Mild elevation left hemidiaphragm. No acute bony abnormality. Mediastinum and hilar structures are normal. IMPRESSION: One PowerPort catheter noted with tip over the right atrium. 2.  Cardiomegaly.  No pulmonary venous congestion. 3.  No acute pulmonary disease. Electronically Signed   By: Marcello Moores  Register   On: 02/21/2018 07:41    Procedures Procedures (including critical care time) CRITICAL CARE Performed by: Si Gaul   Total critical care time: 30 minutes  Critical care time was exclusive of separately billable  procedures and treating other patients.  Critical care was necessary to treat or prevent imminent or life-threatening deterioration.  Critical care was time spent personally  by me on the following activities: development of treatment plan with patient and/or surrogate as well as nursing, discussions with consultants, evaluation of patient's response to treatment, examination of patient, obtaining history from patient or surrogate, ordering and performing treatments and interventions, ordering and review of laboratory studies, ordering and review of radiographic studies, pulse oximetry and re-evaluation of patient's condition. Medications Ordered in ED Medications  ceFEPIme (MAXIPIME) 2 g in sodium chloride 0.9 % 100 mL IVPB (2 g Intravenous New Bag/Given 02/21/18 0845)  sodium chloride 0.9 % bolus 2,667 mL (2,667 mLs Intravenous New Bag/Given 02/21/18 0846)  vancomycin (VANCOCIN) IVPB 1000 mg/200 mL premix (has no administration in time range)  sodium chloride 0.9 % bolus 1,000 mL (0 mLs Intravenous Stopped 02/21/18 0839)     Initial Impression / Assessment and Plan / ED Course  I have reviewed the triage vital signs and the nursing notes.  Pertinent labs & imaging results that were available during my care of the patient were reviewed by me and considered in my medical decision making (see chart for details).     Consult: Radiation oncology nurse notified of patient's admission.  She advises that the patient can be either treated later in the day or delayed treatment if needed. Consult: Try at hospitalist for admission. Consult: (10 AM) radiation oncology has called to notify they will plan to proceed with treatment later once the patient is admitted. Final Clinical Impressions(s) / ED Diagnoses   Final diagnoses:  Sepsis, due to unspecified organism [facility])  Throat cancer [facility])   Patient presents with fever, active treatment with radiation and chemotherapy for throat cancer and confusion  starting last night.  Patient did have a low-grade fever last week and was started empirically on amoxicillin.  At this time however he appears to have possible sepsis with fever, immunocompromised and mental status change.  Broad-spectrum antibiotic coverage initiated.  Fluid resuscitation initiated.  Consultation for admission. ED Discharge Orders    None       Charlesetta Shanks, MD 02/21/18 0900    Charlesetta Shanks, MD 02/21/18 [place], MD 02/21/18 1005

## 2018-02-21 NOTE — Telephone Encounter (Signed)
Oncology Nurse Navigator Documentation  Spoke with Dr. Johnney Killian to inform Dr. Sondra Come, Radiation Oncology, has approved Mr. Bucklin to proceed with RT today pending his admission.  She acknowledged.  Gayleen Orem, RN, BSN Head & Neck Oncology Nurse [place] at [place] [phone]

## 2018-02-21 NOTE — Progress Notes (Addendum)
Oncology Nurse Navigator Documentation  Visited Edgar Herrera ED to check on his well-being.  Wife at bedside.  Was receiving vancomycin. I indicated to them I will check with Drs Sondra Come ([place]) and Perlov ([facility]) regarding moving forward with RT today pending his admission.  Spoke with Dr. Sondra Come who indicated OK to proceed with RT since total neutrophile >0.5 (9795 value = 0.9).  Dr. Lebron Conners indicated RadOnc decision.  Notified Tomo RT tmt likely after his admission.  Gayleen Orem, RN, BSN Head & Neck Oncology Nurse [place] at [place] [phone]

## 2018-02-21 NOTE — H&P (Signed)
History and Physical    Edgar Herrera DDU:[id number] DOB: [birth year] DOA: 02/21/2018  PCP: [facility]   Patient coming from: home   Chief Complaint: fever  HPI: Edgar Herrera is a 72 y.o. male with medical history significant of paroxysmal atrial fibrillation, type 2 diabetes on oral hypoglycemics, hyperlipidemia, hypertension, recent diagnosis of squamous cell cancer of throat coming in with fever without a source.  Patient was seen approximately 1 week ago and received carboplatin/paclitaxel, week #2 on 02/11/2018.  Patient then developed low-grade temperature of 100.7 on 02/13/2018.  Patient was seen by cancer Center on 02/15/2018 and started on Bactrim twice daily.  Patient's fever subsided however yesterday and today patient began having high fevers to 102.  Patient was also noted to be fairly tired and lethargic.  Patient has chronic cough and sputum production that he was told was from his head and neck cancer.  He does not have any shortness of breath, nausea, vomiting, diarrhea.  He is also receiving radiation therapy and has had significant skin changes over his neck from this.     ED Course: In the ED patient was noted to have a temperature of 102.  Patient's heart rate was 104.  Other vital signs were reassuring.  Labs were notable for white blood cell count of 1.2 with approximately 900 neutrophils.  Hemoglobin was 10.3, hematocrit was 30.9.  Platelets were 114.  CMP showed mild hyponatremia at 132.  Creatinine was 1.22 and BUN was elevated at 39.  Glucose was 255.  Chest x-ray was unremarkable.  Patient was admitted for fever without a source in the setting of recent chemotherapy.  Review of Systems: As per HPI otherwise 10 point review of systems negative.    Past Medical History:  Diagnosis Date  . Allergic rhinitis   . Atrial fibrillation ([place])   . Diabetes mellitus without complication ([place])   . HLD (hyperlipidemia) 02/21/2018  . Hyperlipidemia   . Hypertension   . Prostate  cancer ([place])   . T2DM (type 2 diabetes mellitus) ([place]) 02/21/2018    Past Surgical History:  Procedure Laterality Date  . KNEE SURGERY Bilateral    2007 and 1998,   . PROSTATECTOMY  12/15/2006  . SKIN GRAFT     as a child left elbow  . TONSILECTOMY/ADENOIDECTOMY WITH MYRINGOTOMY     as a child     reports that he has never smoked. He has never used smokeless tobacco. He reports that he drinks alcohol. He reports that he does not use drugs.  No Known Allergies  Family History  Problem Relation Age of Onset  . Lung cancer Mother   . Hypertension Mother   . Breast cancer Mother   . Heart attack Father   . Hypertension Sister   . Hypertension Brother   . Hypertension Sister    Unacceptable: Noncontributory, unremarkable, or negative. Acceptable: Family history reviewed and not pertinent (If you reviewed it)  Prior to Admission medications   Medication Sig Start Date End Date Taking? Authorizing Provider  allopurinol (ZYLOPRIM) 100 MG tablet Take 100 mg by mouth daily.   Yes [provider]  amLODipine (NORVASC) 10 MG tablet Take 10 mg by mouth daily.   Yes [provider]  amoxicillin (AMOXIL) 125 MG/5ML suspension Take 500 mg by mouth 2 (two) times daily.   Yes [provider]  apixaban (ELIQUIS) 5 MG TABS tablet Take 5 mg by mouth 2 (two) times daily.   Yes [provider]  Artificial  Tear Solution (SYSTANE CONTACTS) SOLN Apply 1 drop to eye daily.   Yes [provider]  cyanocobalamin 500 MCG tablet Take 500 mcg by mouth 2 (two) times daily.   Yes [provider]  dexamethasone (DECADRON) 4 MG tablet Take 2 tablets (8 mg total) by mouth daily. Start the day after chemotherapy for 2 days. 01/08/18  Yes Perlov, Marinell Blight, MD  glipiZIDE (GLUCOTROL) 5 MG tablet Take 2.5-5 mg by mouth 2 (two) times daily before a meal. Take 1 tablet before breakfast and Take 1/2 tablet before dinner.   Yes [provider]    hydrochlorothiazide (HYDRODIURIL) 25 MG tablet Take 12.5 mg by mouth daily.   Yes [provider]  HYDROcodone-acetaminophen (HYCET) 7.5-325 mg/15 ml solution Take 10 mLs by mouth 4 (four) times daily as needed for moderate pain.   Yes [provider]  lidocaine (XYLOCAINE) 2 % solution Patient: Mix 1part 2% viscous lidocaine, 1part H20. Swish & swallow 70mL of diluted mixture, 8min before meals and at bedtime, up to QID 01/29/18  Yes [name], MD  lidocaine-prilocaine (EMLA) cream Apply to affected area once Patient taking differently: Apply 1 application topically once. Apply to affected area once before chemo 01/08/18  Yes Perlov, Marinell Blight, MD  lisinopril (PRINIVIL,ZESTRIL) 40 MG tablet Take 40 mg by mouth daily.   Yes [provider]  LORazepam (ATIVAN) 0.5 MG tablet Take 1 tablet (0.5 mg total) by mouth every 6 (six) hours as needed (Nausea or vomiting). Patient taking differently: Take 0.5 mg by mouth every 6 (six) hours as needed for anxiety (Nausea and Vomiting).  02/18/18  Yes [name], MD  metFORMIN (GLUCOPHAGE) 1000 MG tablet Take 1,000 mg by mouth 2 (two) times daily with a meal.   Yes [provider]  pravastatin (PRAVACHOL) 40 MG tablet Take 40 mg by mouth daily.   Yes [provider]  sodium fluoride (FLUORISHIELD) 1.1 % GEL dental gel Instill one drop of gel per tooth space of fluoride tray. Place over teeth for 5 minutes. Remove. Spit out excess. Repeat nightly. Patient taking differently: Place 1 application onto teeth at bedtime. Instill one drop of gel per tooth space of fluoride tray. Place over teeth for 5 minutes. Remove. Spit out excess. Repeat nightly. 01/01/18  Yes Lenn Cal, DDS  cephALEXin (KEFLEX) 500 MG capsule Take 1 capsule (500 mg total) by mouth 3 (three) times daily. Patient not taking: Reported on 02/21/2018 02/05/18   Horton, Barbette Hair, MD  Morphine Sulfate (MORPHINE CONCENTRATE) 10 mg / 0.5 ml  concentrated solution Take 0.5 mLs (10 mg total) by mouth every 4 (four) hours as needed for up to 14 days for severe pain. Patient not taking: Reported on 02/21/2018 02/08/18 02/22/18  Ardath Sax, MD  ondansetron (ZOFRAN) 8 MG tablet Take 1 tablet (8 mg total) by mouth 2 (two) times daily as needed for refractory nausea / vomiting. Start on day 3 after chemo. Patient not taking: Reported on 02/08/2018 01/08/18   Ardath Sax, MD  prochlorperazine (COMPAZINE) 10 MG tablet Take 1 tablet (10 mg total) by mouth every 6 (six) hours as needed (Nausea or vomiting). Patient not taking: Reported on 02/08/2018 01/08/18   Ardath Sax, MD  sulfamethoxazole-trimethoprim (BACTRIM,SEPTRA) 200-40 MG/5ML suspension Place 20 mLs into feeding tube 2 (two) times daily. Patient not taking: Reported on 02/21/2018 02/15/18   Harle Stanford., PA-C    Physical Exam: Vitals:   02/21/18 4097 02/21/18 3532 02/21/18 0700 02/21/18  0800  BP: (!) 98/57  (!) 108/50 (!) 105/59  Pulse: (!) 104  (!) 102 96  Resp: 15  15 16   Temp: (!) 102 F (38.9 C)     TempSrc: Rectal     SpO2: 93%  93% 93%  Weight:  88.9 kg (196 lb)    Height:  5\' 9"  (1.753 m)      Constitutional: NAD, calm, comfortable Vitals:   02/21/18 0629 02/21/18 0634 02/21/18 0700 02/21/18 0800  BP: (!) 98/57  (!) 108/50 (!) 105/59  Pulse: (!) 104  (!) 102 96  Resp: 15  15 16   Temp: (!) 102 F (38.9 C)     TempSrc: Rectal     SpO2: 93%  93% 93%  Weight:  88.9 kg (196 lb)    Height:  5\' 9"  (1.753 m)     Eyes: Anicteric sclera ENMT: Dry mucous membranes, poor dentition Neck: [place], diffusely erythematous and inflamed neck with multiple areas of flaking skin, lichenification evident Respiratory: clear to auscultation bilaterally, no wheezing, no crackles. Normal respiratory effort. No accessory muscle use.  Cardiovascular: Tachycardia, regular rhythm, no murmurs Abdomen: no tenderness, no masses palpated. No hepatosplenomegaly. Bowel sounds  positive.  Musculoskeletal: Lower extremity edema Skin: Radiation skin changes on the neck Neurologic: Grossly intact, moving all extremities Psychiatric: Normal judgment and insight. Alert and oriented x 3. Normal mood.    Labs on Admission: I have personally reviewed following labs and imaging studies  CBC: Recent Labs  Lab 02/21/18 0642  WBC 1.2*  NEUTROABS 0.9*  HGB 10.3*  HCT 30.9*  MCV 85.8  PLT 478*   Basic Metabolic Panel: Recent Labs  Lab 02/21/18 0642  NA 132*  K 5.3*  CL 97*  CO2 26  GLUCOSE 255*  BUN 39*  CREATININE 1.22  CALCIUM 8.4*   GFR: Estimated Creatinine Clearance: 59.5 mL/min (by C-G formula based on SCr of 1.22 mg/dL). Liver Function Tests: Recent Labs  Lab 02/21/18 0642  AST 15  ALT 22  ALKPHOS 51  BILITOT 0.9  PROT 6.2*  ALBUMIN 2.7*   No results for input(s): LIPASE, AMYLASE in the last 168 hours. No results for input(s): AMMONIA in the last 168 hours. Coagulation Profile: No results for input(s): INR, PROTIME in the last 168 hours. Cardiac Enzymes: No results for input(s): CKTOTAL, CKMB, CKMBINDEX, TROPONINI in the last 168 hours. BNP (last 3 results) No results for input(s): PROBNP in the last 8760 hours. HbA1C: No results for input(s): HGBA1C in the last 72 hours. CBG: No results for input(s): GLUCAP in the last 168 hours. Lipid Profile: No results for input(s): CHOL, HDL, LDLCALC, TRIG, CHOLHDL, LDLDIRECT in the last 72 hours. Thyroid Function Tests: No results for input(s): TSH, T4TOTAL, FREET4, T3FREE, THYROIDAB in the last 72 hours. Anemia Panel: No results for input(s): VITAMINB12, FOLATE, FERRITIN, TIBC, IRON, RETICCTPCT in the last 72 hours. Urine analysis:    Component Value Date/Time   COLORURINE YELLOW 02/05/2018 0234   APPEARANCEUR CLEAR 02/05/2018 0234   LABSPEC 1.017 02/05/2018 0234   PHURINE 5.0 02/05/2018 0234   GLUCOSEU NEGATIVE 02/05/2018 0234   HGBUR SMALL (A) 02/05/2018 0234   BILIRUBINUR NEGATIVE  02/05/2018 0234   KETONESUR NEGATIVE 02/05/2018 0234   PROTEINUR NEGATIVE 02/05/2018 0234   NITRITE NEGATIVE 02/05/2018 0234   LEUKOCYTESUR NEGATIVE 02/05/2018 0234    Radiological Exams on Admission: Dg Chest Portable 1 View  Result Date: 02/21/2018 CLINICAL DATA:  Throat cancer.  Coughing up red phlegm. EXAM: PORTABLE CHEST 1  VIEW COMPARISON:  Outside PET-CT 12/24/2017. FINDINGS: Left subclavian PowerPort catheter noted with tip in the right atrium. Cardiomegaly with normal pulmonary vascularity. No focal infiltrate. No pleural effusion pneumothorax. Mild elevation left hemidiaphragm. No acute bony abnormality. Mediastinum and hilar structures are normal. IMPRESSION: One PowerPort catheter noted with tip over the right atrium. 2.  Cardiomegaly.  No pulmonary venous congestion. 3.  No acute pulmonary disease. Electronically Signed   By: Marcello Moores  Register   On: 02/21/2018 07:41    EKG: Independently reviewed. 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  Possibilities at the most likely are either pneumonia that has not bloomed yet or possibly a skin source with the significant amounts of radiation changes and possible skin breakdown that have developed on his neck.  A less likely cause could be a pulmonary embolism particularly with his atrial flutter and tachycardia however this is felt to be less likely as he was on apixaban for his atrial fibrillation. -Follow-up blood cultures obtained 02/21/2018 -Continue IV cefepime and vancomycin started 02/21/2018 -We will consider repeat chest x-ray  in the morning  #) Paroxysmal atrial fibrillation/flutter: Currently patient is in atrial flutter likely secondary to stimulus from fever. - Continue apixaban 5 mg twice daily, will discontinue once platelets drop below 50 -Consider initiation of rate control medication  #) Mild AKI: Likely in the setting of dehydration/sepsis. 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  Patient apparently is pending PEG tube placement however he does not appear to be dramatically nutritionally deficient at this time. - Oncologist notified by epic  Fluids: Gentle IV fluids Electrolytes: Monitor and supplement Nutrition: Carb/heart healthy diet  Prophylaxis: On apixaban  Disposition: Pending evaluation of sepsis  Full code    Cristy Folks MD [facility]   If 7PM-7AM, please contact night-coverage [email or web address] Password [facility]  02/21/2018, 9:16 AM

## 2018-02-21 NOTE — ED Notes (Signed)
ED TO INPATIENT HANDOFF REPORT  Name/Age/Gender Edgar Herrera 72 y.o. male  Code Status Advance Directive Documentation     Most Recent Value  Type of Advance Directive  Living will  Pre-existing out of facility DNR order (yellow form or pink MOST form)  -  "MOST" Form in Place?  -      Home/SNF/Other Home  Chief Complaint fever  Level of Care/Admitting Diagnosis ED Disposition    ED Disposition Condition [place]: [place] [[id number]]  Level of Care: Med-Surg [16]  Diagnosis: Fever [097353]  Admitting Physician: Cristy Folks [[id number]]  Attending Physician: Cristy Folks [[id number]]  Estimated length of stay: 3 - 4 days  Certification:: I certify this patient will need inpatient services for at least 2 midnights  PT Class (Do Not Modify): Inpatient [101]  PT Acc Code (Do Not Modify): Private [1]       Medical History Past Medical History:  Diagnosis Date  . Allergic rhinitis   . Atrial fibrillation ([place])   . Diabetes mellitus without complication ([place])   . HLD (hyperlipidemia) 02/21/2018  . Hyperlipidemia   . Hypertension   . Prostate cancer ([place])   . T2DM (type 2 diabetes mellitus) ([place]) 02/21/2018    Allergies No Known Allergies  IV Location/Drains/Wounds Patient Lines/Drains/Airways Status   Active Line/Drains/Airways    Name:   Placement date:   Placement time:   Site:   Days:   Implanted Port 01/28/18 Left Chest   01/28/18    1242    Chest   24   Peripheral IV Left Forearm   -    -    Forearm             Labs/Imaging Results for orders placed or performed during the hospital encounter of 02/21/18 (from the past 48 hour(s))  Comprehensive metabolic panel     Status: Abnormal   Collection Time: 02/21/18  6:42 AM  Result Value Ref Range   Sodium 132 (L) 135 - 145 mmol/L   Potassium 5.3 (H) 3.5 - 5.1 mmol/L   Chloride 97 (L) 101 - 111 mmol/L   CO2 26 22 - 32 mmol/L   Glucose, Bld 255 (H) 65 - 99 mg/dL    BUN 39 (H) 6 - 20 mg/dL   Creatinine, Ser 1.22 0.61 - 1.24 mg/dL   Calcium 8.4 (L) 8.9 - 10.3 mg/dL   Total Protein 6.2 (L) 6.5 - 8.1 g/dL   Albumin 2.7 (L) 3.5 - 5.0 g/dL   AST 15 15 - 41 U/L   ALT 22 17 - 63 U/L   Alkaline Phosphatase 51 38 - 126 U/L   Total Bilirubin 0.9 0.3 - 1.2 mg/dL   GFR calc non Af Amer 57 (L) >60 mL/min   GFR calc Af Amer >60 >60 mL/min    Comment: (NOTE) The eGFR has been calculated using the CKD EPI equation. This calculation has not been validated in all clinical situations. eGFR's persistently <60 mL/min signify possible Chronic Kidney Disease.    Anion gap 9 5 - 15    Comment: Performed at [facility], [place] [address], [place], [place] [address]  CBC WITH DIFFERENTIAL     Status: Abnormal   Collection Time: 02/21/18  6:42 AM  Result Value Ref Range   WBC 1.2 (LL) 4.0 - 10.5 K/uL    Comment: CRITICAL RESULT CALLED TO, READ BACK BY AND VERIFIED WITH: Marjie Skiff. RN @0740  ON  5.16.19 BY NMCCOY    RBC 3.60 (L) 4.22 - 5.81 MIL/uL   Hemoglobin 10.3 (L) 13.0 - 17.0 g/dL   HCT 30.9 (L) 39.0 - 52.0 %   MCV 85.8 78.0 - 100.0 fL   MCH 28.6 26.0 - 34.0 pg   MCHC 33.3 30.0 - 36.0 g/dL   RDW 14.1 11.5 - 15.5 %   Platelets 114 (L) 150 - 400 K/uL    Comment: REPEATED TO VERIFY SPECIMEN CHECKED FOR CLOTS PLATELET COUNT CONFIRMED BY SMEAR    Neutrophils Relative % 75 %   Neutro Abs 0.9 (L) 1.7 - 7.7 K/uL   Lymphocytes Relative 18 %   Lymphs Abs 0.2 (L) 0.7 - 4.0 K/uL   Monocytes Relative 7 %   Monocytes Absolute 0.1 0.1 - 1.0 K/uL   Eosinophils Relative 0 %   Eosinophils Absolute 0.0 0.0 - 0.7 K/uL   Basophils Relative 0 %   Basophils Absolute 0.0 0.0 - 0.1 K/uL   WBC Morphology WHITE COUNT CONFIRMED ON SMEAR     Comment: Performed at [facility], [place] [address] [place], [place] [address]  I-Stat CG4 Lactic Acid, ED  (not at  [facility])     Status: None   Collection Time: 02/21/18  6:50 AM  Result Value Ref Range    Lactic Acid, Venous 1.37 0.5 - 1.9 mmol/L  Urinalysis, Routine w reflex microscopic     Status: Abnormal   Collection Time: 02/21/18  8:12 AM  Result Value Ref Range   Color, Urine YELLOW YELLOW   APPearance CLEAR CLEAR   Specific Gravity, Urine 1.020 1.005 - 1.030   pH 6.0 5.0 - 8.0   Glucose, UA 150 (A) NEGATIVE mg/dL   Hgb urine dipstick NEGATIVE NEGATIVE   Bilirubin Urine NEGATIVE NEGATIVE   Ketones, ur NEGATIVE NEGATIVE mg/dL   Protein, ur 30 (A) NEGATIVE mg/dL   Nitrite NEGATIVE NEGATIVE   Leukocytes, UA NEGATIVE NEGATIVE   RBC / HPF 0-5 0 - 5 RBC/hpf   WBC, UA 0-5 0 - 5 WBC/hpf   Bacteria, UA NONE SEEN NONE SEEN   Squamous Epithelial / LPF 0-5 0 - 5   Mucus PRESENT     Comment: Performed at [facility], [place] [address], [place], [place] [address]   Dg Chest Portable 1 View  Result Date: 02/21/2018 CLINICAL DATA:  Throat cancer.  Coughing up red phlegm. EXAM: PORTABLE CHEST 1 VIEW COMPARISON:  Outside PET-CT 12/24/2017. FINDINGS: Left subclavian PowerPort catheter noted with tip in the right atrium. Cardiomegaly with normal pulmonary vascularity. No focal infiltrate. No pleural effusion pneumothorax. Mild elevation left hemidiaphragm. No acute bony abnormality. Mediastinum and hilar structures are normal. IMPRESSION: One PowerPort catheter noted with tip over the right atrium. 2.  Cardiomegaly.  No pulmonary venous congestion. 3.  No acute pulmonary disease. Electronically Signed   By: Marcello Moores  Register   On: 02/21/2018 07:41    Pending Labs Unresulted Labs (From admission, onward)   Start     Ordered   02/22/18 0500  Creatinine, serum  Daily,   R     02/21/18 0934   02/21/18 0640  Blood Culture (routine x 2)  BLOOD CULTURE X 2,   STAT     02/21/18 0640   Signed and Held  Basic metabolic panel  Tomorrow morning,   R     Signed and Held   Signed and Held  CBC  Tomorrow morning,   R     Signed  and Held      Vitals/Pain Today's Vitals   02/21/18 0800  02/21/18 0933 02/21/18 1000 02/21/18 1042  BP: (!) 105/59  122/67   Pulse: 96 97 (!) 105   Resp: 16 16 19    Temp:    98.7 F (37.1 C)  TempSrc:    Oral  SpO2: 93% 97% 96%   Weight:  197 lb (89.4 kg)    Height:  5' 9"  (1.753 m)    PainSc:        Isolation Precautions No active isolations  Medications Medications  vancomycin (VANCOCIN) IVPB 1000 mg/200 mL premix (1,000 mg Intravenous New Bag/Given 02/21/18 1000)  apixaban (ELIQUIS) tablet 5 mg (has no administration in time range)  vancomycin (VANCOCIN) 1,250 mg in sodium chloride 0.9 % 250 mL IVPB (has no administration in time range)  ceFEPIme (MAXIPIME) 2 g in sodium chloride 0.9 % 100 mL IVPB (has no administration in time range)  sodium chloride 0.9 % bolus 1,000 mL (0 mLs Intravenous Stopped 02/21/18 0839)  ceFEPIme (MAXIPIME) 2 g in sodium chloride 0.9 % 100 mL IVPB (0 g Intravenous Stopped 02/21/18 0956)  sodium chloride 0.9 % bolus 2,667 mL (2,667 mLs Intravenous New Bag/Given 02/21/18 0846)    Mobility walks

## 2018-02-21 NOTE — Telephone Encounter (Signed)
I received a phone call from the ED regarding Edgar Herrera. I spoke with Dr. Johnney Killian in the Emergency Room and she reported to me that Edgar Herrera would most likely be admitted today. Per the ED notes Edgar Herrera came in for a coughing fit with over production of red tinged phlegm. He also had a fever of 102. Dr. Johnney Killian felt that it was best to cancel Edgar Herrera radiation treatment today due to his illness. I will check on him tomorrow and attempt to get him to radiation depending on how he is doing. I will notify Dr. Isidore Moos and Gayleen Orem RN navigator via telephone note.

## 2018-02-21 NOTE — ED Notes (Signed)
Pt provided with a urinal for urine specimen.

## 2018-02-21 NOTE — Telephone Encounter (Signed)
Oncology Nurse Navigator Documentation  Returned pt wife's VMM, confirmed his arrival to 1344, his interest in having RT. Spoke with RN Burman Nieves, informed her transporter is enroute to pick him up for treatment.  She acknowledged.  Gayleen Orem, RN, BSN Head & Neck Oncology Nurse [place] at [place] [phone]

## 2018-02-22 ENCOUNTER — Inpatient Hospital Stay: Payer: No Typology Code available for payment source

## 2018-02-22 ENCOUNTER — Inpatient Hospital Stay: Payer: No Typology Code available for payment source | Admitting: Hematology and Oncology

## 2018-02-22 ENCOUNTER — Ambulatory Visit
Admission: RE | Admit: 2018-02-22 | Discharge: 2018-02-22 | Disposition: A | Discharge status: Home / Self Care | Payer: No Typology Code available for payment source | Source: Ambulatory Visit | Attending: Radiation Oncology | Admitting: Radiation Oncology

## 2018-02-22 ENCOUNTER — Encounter: Payer: Self-pay | Admitting: Radiation Oncology

## 2018-02-22 DIAGNOSIS — I480 Paroxysmal atrial fibrillation: Secondary | ICD-10-CM

## 2018-02-22 DIAGNOSIS — I10 Essential (primary) hypertension: Secondary | ICD-10-CM

## 2018-02-22 DIAGNOSIS — C01 Malignant neoplasm of base of tongue: Secondary | ICD-10-CM

## 2018-02-22 DIAGNOSIS — Z7901 Long term (current) use of anticoagulants: Secondary | ICD-10-CM

## 2018-02-22 LAB — BASIC METABOLIC PANEL
Anion gap: 11 (ref 5–15)
CO2: 23 mmol/L (ref 22–32)
GFR calc Af Amer: >60 mL/min (ref >60)
GFR calc non Af Amer: >60 mL/min (ref >60)
Glucose, Bld: 340 mg/dL — ABNORMAL HIGH (ref 65–99)
Potassium: 4.8 mmol/L (ref 3.5–5.1)

## 2018-02-22 LAB — GLUCOSE, CAPILLARY
Glucose-Capillary: 307 mg/dL — ABNORMAL HIGH (ref 65–99)
Glucose-Capillary: 239 mg/dL — ABNORMAL HIGH (ref 65–99)
Glucose-Capillary: 175 mg/dL — ABNORMAL HIGH (ref 65–99)
Glucose-Capillary: 155 mg/dL — ABNORMAL HIGH (ref 65–99)
Glucose-Capillary: 293 mg/dL — ABNORMAL HIGH (ref 65–99)

## 2018-02-22 LAB — BASIC METABOLIC PANEL WITH GFR
BUN: 27 mg/dL — ABNORMAL HIGH (ref 6–20)
Calcium: 8.1 mg/dL — ABNORMAL LOW (ref 8.9–10.3)
Chloride: 100 mmol/L — ABNORMAL LOW (ref 101–111)
Creatinine, Ser: 1.01 mg/dL (ref 0.61–1.24)
Sodium: 134 mmol/L — ABNORMAL LOW (ref 135–145)

## 2018-02-22 LAB — CBC
HCT: 29.8 % — ABNORMAL LOW (ref 39.0–52.0)
Hemoglobin: 9.9 g/dL — ABNORMAL LOW (ref 13.0–17.0)
MCH: 29 pg (ref 26.0–34.0)
MCHC: 33.2 g/dL (ref 30.0–36.0)
MCV: 87.4 fL (ref 78.0–100.0)
Platelets: 105 K/uL — ABNORMAL LOW (ref 150–400)
RBC: 3.41 MIL/uL — ABNORMAL LOW (ref 4.22–5.81)
RDW: 14.4 % (ref 11.5–15.5)
WBC: 1.3 10*3/uL — CL (ref 4.0–10.5)

## 2018-02-22 MED ORDER — MAGIC MOUTHWASH W/LIDOCAINE
1.0000 mL | Freq: Three times a day (TID) | ORAL | Status: DC
  Administered 2018-02-22 – 2018-03-01 (×21): 1 mL via ORAL
  Filled 2018-02-22 (×23): qty 5

## 2018-02-22 MED ORDER — JEVITY 1.2 CAL PO LIQD
474.0000 mL | Freq: Three times a day (TID) | ORAL | Status: DC
  Administered 2018-02-22 – 2018-03-01 (×28): 474 mL

## 2018-02-22 MED ORDER — PRO-STAT SUGAR FREE PO LIQD
30.0000 mL | Freq: Two times a day (BID) | ORAL | Status: DC
  Administered 2018-02-22 – 2018-03-01 (×16): 30 mL
  Filled 2018-02-22 (×15): qty 30

## 2018-02-22 MED ORDER — SONAFINE EX EMUL
1.0000 "application " | Freq: Once | CUTANEOUS | Status: AC
  Administered 2018-02-22: 1 via TOPICAL

## 2018-02-22 MED ORDER — VANCOMYCIN HCL 10 G IV SOLR
1500.0000 mg | INTRAVENOUS | Status: DC
  Administered 2018-02-22 – 2018-02-26 (×5): 1500 mg via INTRAVENOUS
  Filled 2018-02-22 (×5): qty 1500

## 2018-02-22 NOTE — Progress Notes (Signed)
PROGRESS NOTE  Edgar Herrera WUJ:[id number] DOB: February 06, 1946 DOA: 02/21/2018 PCP: [facility]  HPI/Recap of past 24 hours: Edgar Herrera is a 72 y.o. male with medical history significant of paroxysmal atrial fibrillation, type 2 diabetes on oral hypoglycemics, hyperlipidemia, hypertension, recent diagnosis of squamous cell cancer of throat coming in with fever without a source.  Patient was seen approximately 1 week ago and received carboplatin/paclitaxel, week #2 on 02/11/2018.  Patient then developed low-grade temperature of 100.7 on 02/13/2018.  Patient was seen by cancer Center on 02/15/2018 and started on Bactrim twice daily.  Patient's fever subsided however yesterday and today patient began having high fevers to 102.  Patient was also noted to be fairly tired and lethargic.  Patient has chronic cough and sputum production that he was told was from his head and neck cancer.  He does not have any shortness of breath, nausea, vomiting, diarrhea.  He is also receiving radiation therapy and has had significant skin changes over his neck from this.  ED Course: In the ED patient was noted to have a temperature of 102.  Patient's heart rate was 104.  Other vital signs were reassuring.  Labs were notable for white blood cell count of 1.2 with approximately 900 neutrophils.  Hemoglobin was 10.3, hematocrit was 30.9.  Platelets were 114.  CMP showed mild hyponatremia at 132.  Creatinine was 1.22 and BUN was elevated at 39.  Glucose was 255.  Chest x-ray was unremarkable.  Patient was admitted for fever without a source in the setting of recent chemotherapy.  02/22/18: Patient seen and examined at his bedside.  He reports odynophagia.  Mouthwash/ oral care ordered.  Has a PEG tube in place.  PEG tube feeding has been restarted.   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)   HLD (hyperlipidemia)   T2DM (type 2 diabetes mellitus) ([place])  Sepsis from unclear source Possibly from skin breakdown noted on neck and upper chest Continue IV cefepime and IV vancomycin empirically Blood culture x2 no growth to date Urine analysis unremarkable Chest x-ray personally reviewed with no lobular infiltrates   Head and neck squamous cell carcinoma Currently undergoing chemo radiotherapy for curative intent treatment of head and neck squamous cell carcinoma per oncology Oncology following  Chronic cough without any change in sputum production Continue breathing treatments  Paroxysmal A. fib Rate controlled Continue Eliquis  Odynophagia Mouthwash and oral care ordered Has PEG tube in place Continue PEG tube feeding Continue free water flushes  Neutropenia/leukopenia, improving WBC 1.3 from 1.2 Hematology/oncology following  Pancytopenia Management per hematology/oncology  Hyponatremia Sodium 134 from 132  AKI Improving Baseline creatinine 0.7 Creatinine on presentation 1.22 Avoid nephrotoxic agents/hypotension/dehydration Repeat BMP in the morning   Code Status: Full code  Family Communication: None at bedside  Disposition Plan: Home when clinically stable   Consultants:  Hematology/oncology  Procedures:  None  Antimicrobials:  Cefepime and IV vancomycin  DVT prophylaxis: Eliquis   Objective: Vitals:   02/21/18 1829 02/21/18 2052 02/22/18 0424 02/22/18 1438  BP: (!) 117/93 126/63 114/72 104/76  Pulse: (!) 112 (!) 102 (!) 112 (!) 127  Resp: 17 18 16 18   Temp: 99.7 F (37.6 C) 98.6 F (37 C) 99.6 F (37.6 C) 99.1 F (37.3 C)  TempSrc: Oral Axillary Oral Oral  SpO2:  96% 96% 95%  Weight:  88.7 kg (195 lb 9.6 oz)   Height:        Intake/Output Summary (Last 24 hours) at 02/22/2018 1654 Last data filed at 02/21/2018 2104 Gross per 24 hour  Intake 0 ml  Output -  Net 0 ml   Filed Weights    02/21/18 0933 02/21/18 1249 02/22/18 0424  Weight: 89.4 kg (197 lb) 89.4 kg (197 lb) 88.7 kg (195 lb 9.6 oz)    Exam:  . General: 72 y.o. year-old male well developed well nourished in no acute distress.  Alert and oriented x3. . Cardiovascular: Regular rate and rhythm with no rubs or gallops.  No thyromegaly or JVD noted.   Marland Kitchen Respiratory: Clear to auscultation with no wheezes or rales. Good inspiratory effort. . Abdomen: Soft nontender nondistended with normal bowel sounds x4 quadrants. . Musculoskeletal: No lower extremity edema. 2/4 pulses in all 4 extremities. . Skin: No ulcerative lesions noted or rashes, . Psychiatry: Mood is appropriate for condition and setting   Data Reviewed: CBC: Recent Labs  Lab 02/21/18 0642 02/22/18 0358  WBC 1.2* 1.3*  NEUTROABS 0.9*  --   HGB 10.3* 9.9*  HCT 30.9* 29.8*  MCV 85.8 87.4  PLT 114* 678*   Basic Metabolic Panel: Recent Labs  Lab 02/21/18 0642 02/22/18 0358  NA 132* 134*  K 5.3* 4.8  CL 97* 100*  CO2 26 23  GLUCOSE 255* 340*  BUN 39* 27*  CREATININE 1.22 1.01  CALCIUM 8.4* 8.1*   GFR: Estimated Creatinine Clearance: 71.8 mL/min (by C-G formula based on SCr of 1.01 mg/dL). Liver Function Tests: Recent Labs  Lab 02/21/18 0642  AST 15  ALT 22  ALKPHOS 51  BILITOT 0.9  PROT 6.2*  ALBUMIN 2.7*   No results for input(s): LIPASE, AMYLASE in the last 168 hours. No results for input(s): AMMONIA in the last 168 hours. Coagulation Profile: No results for input(s): INR, PROTIME in the last 168 hours. Cardiac Enzymes: No results for input(s): CKTOTAL, CKMB, CKMBINDEX, TROPONINI in the last 168 hours. BNP (last 3 results) No results for input(s): PROBNP in the last 8760 hours. HbA1C: No results for input(s): HGBA1C in the last 72 hours. CBG: Recent Labs  Lab 02/21/18 2006 02/21/18 2354 02/22/18 0426 02/22/18 0832 02/22/18 1224  GLUCAP 254* 171* 307* 239* 175*   Lipid Profile: No results for input(s): CHOL, HDL,  LDLCALC, TRIG, CHOLHDL, LDLDIRECT in the last 72 hours. Thyroid Function Tests: No results for input(s): TSH, T4TOTAL, FREET4, T3FREE, THYROIDAB in the last 72 hours. Anemia Panel: No results for input(s): VITAMINB12, FOLATE, FERRITIN, TIBC, IRON, RETICCTPCT in the last 72 hours. Urine analysis:    Component Value Date/Time   COLORURINE YELLOW 02/21/2018 0812   APPEARANCEUR CLEAR 02/21/2018 0812   LABSPEC 1.020 02/21/2018 0812   PHURINE 6.0 02/21/2018 0812   GLUCOSEU 150 (A) 02/21/2018 0812   HGBUR NEGATIVE 02/21/2018 0812   BILIRUBINUR NEGATIVE 02/21/2018 0812   KETONESUR NEGATIVE 02/21/2018 0812   PROTEINUR 30 (A) 02/21/2018 0812   NITRITE NEGATIVE 02/21/2018 0812   LEUKOCYTESUR NEGATIVE 02/21/2018 0812   Sepsis Labs: @LABRCNTIP (procalcitonin:4,lacticidven:4)  ) Recent Results (from the past 240 hour(s))  Blood Culture (routine x 2)     Status: None (Preliminary result)   Collection Time: 02/21/18  6:43 AM  Result Value Ref Range Status   Specimen Description   Final    BLOOD LEFT ANTECUBITAL Performed at [facility], [place] [address] [place], [place] [address]    Special Requests  Final    BOTTLES DRAWN AEROBIC AND ANAEROBIC Blood Culture adequate volume Performed at [facility] [address] [place], [place] [address]    Culture   Final    NO GROWTH 1 DAY Performed at [facility], [place] [address], [place], [place] [address]    Report Status PENDING  Incomplete  Blood Culture (routine x 2)     Status: None (Preliminary result)   Collection Time: 02/21/18  8:12 AM  Result Value Ref Range Status   Specimen Description   Final    BLOOD RIGHT ANTECUBITAL Performed at [facility] [address] [place], [place] [address]    Special Requests   Final    BOTTLES DRAWN AEROBIC AND ANAEROBIC Blood Culture results may not be optimal due to an excessive volume of blood received in culture bottles Performed  at [facility] [address], [place], [place] [address]    Culture   Final    NO GROWTH 1 DAY Performed at [facility], [place] [address], [place],  [address]    Report Status PENDING  Incomplete      Studies: No results found.  Scheduled Meds: . allopurinol  100 mg Per Tube Daily  . amLODipine  10 mg Per Tube Daily  . apixaban  5 mg Per Tube BID  . docusate  100 mg Per Tube BID  . feeding supplement (JEVITY 1.2 CAL)  474 mL Per Tube TID WC & HS  . feeding supplement (PRO-STAT SUGAR FREE 64)  30 mL Per Tube BID  . hydrochlorothiazide  12.5 mg Per Tube Daily  . insulin aspart  0-9 Units Subcutaneous TID WC  . magic mouthwash w/lidocaine  1 mL Oral TID  . pravastatin  40 mg Per Tube Daily  . sennosides  5 mL Per Tube BID  . vitamin B-12  500 mcg Per Tube BID    Continuous Infusions: . ceFEPime (MAXIPIME) IV Stopped (02/22/18 1320)  . vancomycin       LOS: 1 day     Kayleen Memos, MD [facility] Pager [phone]  If 7PM-7AM, please contact night-coverage [email or web address] Password [facility] 02/22/2018, 4:54 PM

## 2018-02-22 NOTE — Progress Notes (Addendum)
Initial Nutrition Assessment  DOCUMENTATION CODES:   Not applicable  INTERVENTION:  Jevity 1.2 @ 2 cans (237 ml each) QID with prostat BID via feeding tube; total regimen providing 2480 kcal, 135 grams of protein, and 1528 ml H2O.   NUTRITION DIAGNOSIS:   Inadequate oral intake related to inability to eat as evidenced by per patient/family report, NPO status.  GOAL:   Patient will meet greater than or equal to 90% of their needs  MONITOR:   PO intake, TF tolerance, Skin, Labs, Weight trends, I & O's  REASON FOR ASSESSMENT:   Consult, Malnutrition Screening Tool Enteral/tube feeding initiation and management  ASSESSMENT:   72 y.o. M admitted on 02/21/18 for fever. Pt currently undergoing ChRT for head and neck squamous cell carcinoma (tongue). Pt s/p feeding tube placement per RD note 02/08/18; pt goes to the [place] for enteral formulas. PMH HLD, HTN, hx of prostate cancer, a fib, T2DM. Pt currently drinks alcohol (occasional beer/wine: 2x monthly).   Per oncologist note plans to continue RT and hold systemic chemotherapy until pt recovers neutrophil count and is no longer febrile - likely to resume systemic chemotherapy regimen next week. Per RD note 02/15/18 pt final treatment for RT is May 30th.  Per RD note 02/08/18 recommended home regimen for enteral feeding is as follows: 7 bottles Jevity 1.5 in 4 feedings daily with 60 mL free water before and after bolus feedings.  In addition patient will flush feeding tube with 240 mL free water 3 times daily.   Pt weight trending down since 05/2017 when pt weighed 99.8 kg, now 88.7 kg; 11% of weight loss in < 1 year - not significant. His weight started to drop more rapidly around April when he weighed 97 kg, now 88.7 kg; 8.5 % weight loss in 1 month - significant. Some weight changes may be due to fluid fluctuations.   Pt sitting in chair with wife at bedside. Pt wife reports pt loses about 4 lbs every week since his therapies began. Pt wife  reports pt [facility]; 1 in the morning with medication, 2 around lunch, 2 around 4pm, and 1 with his night medications; total regimen providing 2250 kcal and 102 grams of protein.  Wife reports pt tolerating well, but keeps having sputum come up various times through the day. This sputum will also come up whenever he eats orally or lays down after eating.   Wife asked if the Isosource had too many CHOs for pt diabetes; told wife that typically pts are only put on Glucerna if they are not tolerating any other formulas. Pt wife reports that his BG has been high in the hospital, but was told this has to do with his acute illness; told wife that BG tends to run a bit high in the hospital, but at home it is important to keep track of his BG on the formula to make sure he is tolerating. Wife reports BG can sometimes run a bit higher at home due to all of the therapies he is undergoing and the cancer, but the doctor is not concerned.   Pt and wife report pt is not and does not typically experience pain.   Medications reviewed: zyloprim, colace, jevity 1.2 CAL (237 ml) Q6 hours, hydrodiuril, novolog 0-9 units, senokot, vitamin B12, vancocin.   Labs reviewed: Na 134 (L), Cl 100 (L), BG 340 (H), BUN 27 (H), WBC 1.3 (L), RBC 3.41 (L), hemoglobin 9.9 (L),  HCT 29.8 (L).   Net I/O since admit: 0 ml (no output or input)  NUTRITION - FOCUSED PHYSICAL EXAM:    Most Recent Value  Orbital Region  No depletion  Upper Arm Region  Mild depletion  Thoracic and Lumbar Region  No depletion  Buccal Region  No depletion  Temple Region  No depletion  Clavicle [place] Region  Unable to assess  Clavicle and Acromion Serena Region  No depletion  Scapular [place] Region  No depletion  Dorsal Hand  No depletion  Patellar Region  No depletion  Anterior Thigh Region  No depletion  Posterior Calf Region  Unable to assess [edema]  Edema (RD Assessment)  Mild  Hair  Reviewed  Eyes  Reviewed   Mouth  Unable to assess  Skin  Reviewed  Nails  Reviewed       Diet Order:   Diet Order           Diet NPO time specified  Diet effective now          EDUCATION NEEDS:   Education needs have been addressed  Skin:  Skin Assessment: Reviewed RN Assessment  Last BM:  02/20/18  Height:   Ht Readings from Last 1 Encounters:  02/21/18 5\' 9"  (1.753 m)    Weight:   Wt Readings from Last 1 Encounters:  02/22/18 195 lb 9.6 oz (88.7 kg)    Ideal Body Weight:  72.72 kg  BMI:  Body mass index is 28.89 kg/m.  Estimated Nutritional Needs:   Kcal:  2300-2500 kcal  Protein:  115-130 grams  Fluid:  >/= 2.4 L   Hope Budds, Dietetic Intern

## 2018-02-22 NOTE — Progress Notes (Signed)
Pharmacy Antibiotic Note  Edgar Herrera is a 72 y.o. male with tongue cancer currently undergoing XRT + chemotherapy presented to the ED on 02/21/2018 with c/o fever and weakness.  To start vancomycin for febrile neutropenia/sepsis.  Today, 02/22/2018: - Tmax 99.6, wbc low - scr down 1 (crcl~72), ANC 0.9   Plan: - with scr improving, will adjust Vancomycin to 1500 mg IV q24h for est AUC 442 - continue cefepime dose to 2mg  IV q8h for febrile neutropenia indication - monitor renal function ______________________________________  Height: 5\' 9"  (175.3 cm) Weight: 195 lb 9.6 oz (88.7 kg) IBW/kg (Calculated) : 70.7  Temp (24hrs), Avg:99.2 F (37.3 C), Min:98.6 F (37 C), Max:99.7 F (37.6 C)  Recent Labs  Lab 02/21/18 0642 02/21/18 0650 02/22/18 0358  WBC 1.2*  --  1.3*  CREATININE 1.22  --  1.01  LATICACIDVEN  --  1.37  --     Estimated Creatinine Clearance: 71.8 mL/min (by C-G formula based on SCr of 1.01 mg/dL).    No Known Allergies   Thank you for allowing pharmacy to be a part of this patient's care.  Lynelle Doctor 02/22/2018 9:46 AM

## 2018-02-23 DIAGNOSIS — B370 Candidal stomatitis: Secondary | ICD-10-CM

## 2018-02-23 LAB — GLUCOSE, CAPILLARY
Glucose-Capillary: 203 mg/dL — ABNORMAL HIGH (ref 65–99)
Glucose-Capillary: 245 mg/dL — ABNORMAL HIGH (ref 65–99)
Glucose-Capillary: 298 mg/dL — ABNORMAL HIGH (ref 65–99)
Glucose-Capillary: 299 mg/dL — ABNORMAL HIGH (ref 65–99)

## 2018-02-23 LAB — CBC WITH DIFFERENTIAL/PLATELET
BASOS ABS: 0 10*3/uL (ref 0.0–0.1)
Basophils Relative: 0 %
EOS PCT: 0 %
Eosinophils Absolute: 0 10*3/uL (ref 0.0–0.7)
HEMATOCRIT: 29.1 % — AB (ref 39.0–52.0)
Hemoglobin: 9.7 g/dL — ABNORMAL LOW (ref 13.0–17.0)
LYMPHS ABS: 0.2 10*3/uL — AB (ref 0.7–4.0)
Lymphocytes Relative: 18 %
MCH: 28.2 pg (ref 26.0–34.0)
MCHC: 33.3 g/dL (ref 30.0–36.0)
MCV: 84.6 fL (ref 78.0–100.0)
MONO ABS: 0.2 10*3/uL (ref 0.1–1.0)
Monocytes Relative: 21 %
NEUTROS ABS: 0.6 10*3/uL — AB (ref 1.7–7.7)
Neutrophils Relative %: 61 %
Platelets: 110 10*3/uL — ABNORMAL LOW (ref 150–400)
RBC: 3.44 MIL/uL — AB (ref 4.22–5.81)
RDW: 14.3 % (ref 11.5–15.5)
WBC: 1 10*3/uL — CL (ref 4.0–10.5)

## 2018-02-23 LAB — CREATININE, SERUM
Creatinine, Ser: 0.81 mg/dL (ref 0.61–1.24)
GFR calc Af Amer: >60 mL/min (ref >60)
GFR calc non Af Amer: >60 mL/min (ref >60)

## 2018-02-23 MED ORDER — FLUCONAZOLE IN SODIUM CHLORIDE 400-0.9 MG/200ML-% IV SOLN: 400.0000 mg | INTRAVENOUS | Status: DC

## 2018-02-23 MED ORDER — NYSTATIN 100000 UNIT/ML MT SUSP
5.0000 mL | Freq: Four times a day (QID) | OROMUCOSAL | Status: DC
  Administered 2018-02-23 – 2018-02-24 (×8): 500000 [IU] via ORAL
  Filled 2018-02-23 (×8): qty 5

## 2018-02-23 MED ORDER — FLUCONAZOLE IN SODIUM CHLORIDE 400-0.9 MG/200ML-% IV SOLN
400.0000 mg | INTRAVENOUS | Status: AC
  Administered 2018-02-23 – 2018-02-25 (×3): 400 mg via INTRAVENOUS
  Filled 2018-02-23 (×3): qty 200

## 2018-02-23 NOTE — Progress Notes (Signed)
PROGRESS NOTE  Edgar Herrera MOQ:[id number] DOB: Aug 27, 1946 DOA: 02/21/2018 PCP: [facility]  HPI/Recap of past 24 hours: Edgar Herrera is a 72 y.o. male with medical history significant of paroxysmal atrial fibrillation, type 2 diabetes on oral hypoglycemics, hyperlipidemia, hypertension, recent diagnosis of squamous cell cancer of throat coming in with fever without a source.  Patient was seen approximately 1 week ago and received carboplatin/paclitaxel, week #2 on 02/11/2018.  Patient then developed low-grade temperature of 100.7 on 02/13/2018.  Patient was seen by cancer Center on 02/15/2018 and started on Bactrim twice daily.  Patient's fever subsided however yesterday and today patient began having high fevers to 102.  Patient was also noted to be fairly tired and lethargic.  Patient has chronic cough and sputum production that he was told was from his head and neck cancer.  He does not have any shortness of breath, nausea, vomiting, diarrhea.  He is also receiving radiation therapy and has had significant skin changes over his neck from this.  ED Course: In the ED patient was noted to have a temperature of 102.  Patient's heart rate was 104.  Other vital signs were reassuring.  Labs were notable for white blood cell count of 1.2 with approximately 900 neutrophils.  Hemoglobin was 10.3, hematocrit was 30.9.  Platelets were 114.  CMP showed mild hyponatremia at 132.  Creatinine was 1.22 and BUN was elevated at 39.  Glucose was 255.  Chest x-ray was unremarkable.  Patient was admitted for fever without a source in the setting of recent chemotherapy.  02/22/18: Patient seen and examined at his bedside.  He reports odynophagia.  Mouthwash/ oral care ordered.  Has a PEG tube in place.  PEG tube feeding has been restarted.  02/23/18: persistent odynophagia. IV fluconazole and Nystatin mouth care ordered for oropharyngeal candidiasis. No other complaints.   Assessment/Plan: Principal Problem:    Fever Active Problems:   Paroxysmal atrial fibrillation (HCC)   Chronic anticoagulation   Essential hypertension   Squamous cell carcinoma of base of tongue (HCC)   HLD (hyperlipidemia)   T2DM (type 2 diabetes mellitus) ([place])  Sepsis from unclear etiology, stable Possibly from skin breakdown noted on neck and upper chest vs oropharyngeal candidiasis Added nystatin and IV fluconazole C/w IV cefepime and IV vancomycin empirically Blood culture x2 no growth to date Urine analysis unremarkable Chest x-ray personally reviewed with no lobular infiltrates   Oropharyngeal candidiasis in the setting of immunosuppression/chemotherapy Started on nystatin q6h Started on IV fluconazole 400 mg daily  Head and neck squamous cell carcinoma Currently undergoing chemo radiotherapy for curative intent treatment of head and neck squamous cell carcinoma per oncology Oncology following  Chronic cough, stable No change in sputum production Continue breathing treatments  Paroxysmal afib, stable Rate controlled Continue Eliquis  Odynophagia 2/2 to oral candidiasis C/w mouthwash Nystatin IV fluconazole Has PEG tube in place Continue PEG tube feeding Continue free water flushes  Severe leukopenia Wbc 1.0 from 1.3 Oncology following Repeat CBC in the morning  Pancytopenia Oncology following Management per hematology/oncology  Hyponatremia, improving Sodium 134 from 132  AKI, resolved Cr 0.8 on 02/23/18 Creatinine on presentation 1.22 Avoid nephrotoxic agents/hypotension/dehydration   Code Status: Full code  Family Communication: None at bedside  Disposition Plan: Home when clinically stable   Consultants:  Hematology/oncology  Procedures:  None  Antimicrobials:  Cefepime and IV vancomycin  DVT prophylaxis: Eliquis   Objective: Vitals:   02/22/18 2118 02/22/18 2156 02/23/18 0535 02/23/18 1358  BP:  113/64 125/85 121/70  Pulse:  99 (!) 108 79  Resp:  (!) 21 19  15   Temp: 100.2 F (37.9 C) (!) 101.1 F (38.4 C) 99 F (37.2 C) 99.4 F (37.4 C)  TempSrc: Oral Oral Oral Oral  SpO2:  90% 98% 98%  Weight:      Height:        Intake/Output Summary (Last 24 hours) at 02/23/2018 1521 Last data filed at 02/23/2018 0321 Gross per 24 hour  Intake 4852.35 ml  Output -  Net 3329.35 ml   Filed Weights   02/21/18 0933 02/21/18 1249 02/22/18 0424  Weight: 89.4 kg (197 lb) 89.4 kg (197 lb) 88.7 kg (195 lb 9.6 oz)    Exam: 02/23/18  . General: 72 y.o. year-old male WD wN NAD A&O x 3. . Cardiovascular: RRR no rubs or gallops. No JVD or thyromegaly. Marland Kitchen Respiratory: Clear to auscultation with no wheezes or rales. Good inspiratory effort. . Abdomen: Soft nontender nondistended with normal bowel sounds x4 quadrants. . Musculoskeletal: No lower extremity edema. 2/4 pulses in all 4 extremities. . Skin: No ulcerative lesions noted or rashes, . Psychiatry: Mood is appropriate for condition and setting   Data Reviewed: CBC: Recent Labs  Lab 02/21/18 0642 02/22/18 0358 02/23/18 0830  WBC 1.2* 1.3* 1.0*  NEUTROABS 0.9*  --  0.6*  HGB 10.3* 9.9* 9.7*  HCT 30.9* 29.8* 29.1*  MCV 85.8 87.4 84.6  PLT 114* 105* 518*   Basic Metabolic Panel: Recent Labs  Lab 02/21/18 0642 02/22/18 0358 02/23/18 0830  NA 132* 134*  --   K 5.3* 4.8  --   CL 97* 100*  --   CO2 26 23  --   GLUCOSE 255* 340*  --   BUN 39* 27*  --   CREATININE 1.22 1.01 0.81  CALCIUM 8.4* 8.1*  --    GFR: Estimated Creatinine Clearance: 89.5 mL/min (by C-G formula based on SCr of 0.81 mg/dL). Liver Function Tests: Recent Labs  Lab 02/21/18 0642  AST 15  ALT 22  ALKPHOS 51  BILITOT 0.9  PROT 6.2*  ALBUMIN 2.7*   No results for input(s): LIPASE, AMYLASE in the last 168 hours. No results for input(s): AMMONIA in the last 168 hours. Coagulation Profile: No results for input(s): INR, PROTIME in the last 168 hours. Cardiac Enzymes: No results for input(s): CKTOTAL, CKMB,  CKMBINDEX, TROPONINI in the last 168 hours. BNP (last 3 results) No results for input(s): PROBNP in the last 8760 hours. HbA1C: No results for input(s): HGBA1C in the last 72 hours. CBG: Recent Labs  Lab 02/22/18 1224 02/22/18 1740 02/22/18 2154 02/23/18 0751 02/23/18 1208  GLUCAP 175* 155* 293* 203* 245*   Lipid Profile: No results for input(s): CHOL, HDL, LDLCALC, TRIG, CHOLHDL, LDLDIRECT in the last 72 hours. Thyroid Function Tests: No results for input(s): TSH, T4TOTAL, FREET4, T3FREE, THYROIDAB in the last 72 hours. Anemia Panel: No results for input(s): VITAMINB12, FOLATE, FERRITIN, TIBC, IRON, RETICCTPCT in the last 72 hours. Urine analysis:    Component Value Date/Time   COLORURINE YELLOW 02/21/2018 0812   APPEARANCEUR CLEAR 02/21/2018 0812   LABSPEC 1.020 02/21/2018 0812   PHURINE 6.0 02/21/2018 0812   GLUCOSEU 150 (A) 02/21/2018 0812   HGBUR NEGATIVE 02/21/2018 0812   BILIRUBINUR NEGATIVE 02/21/2018 0812   KETONESUR NEGATIVE 02/21/2018 0812   PROTEINUR 30 (A) 02/21/2018 0812   NITRITE NEGATIVE 02/21/2018 0812   LEUKOCYTESUR NEGATIVE 02/21/2018 0812   Sepsis Labs: @LABRCNTIP (procalcitonin:4,lacticidven:4)  ) Recent Results (  from the past 240 hour(s))  Blood Culture (routine x 2)     Status: None (Preliminary result)   Collection Time: 02/21/18  6:43 AM  Result Value Ref Range Status   Specimen Description   Final    BLOOD LEFT ANTECUBITAL Performed at [facility] [address], [place], [place] [address]    Special Requests   Final    BOTTLES DRAWN AEROBIC AND ANAEROBIC Blood Culture adequate volume Performed at [facility] [address], [place], [place] [address]    Culture   Final    NO GROWTH 2 DAYS Performed at [facility] [address] [place], [place] [address]    Report Status PENDING  Incomplete  Blood Culture (routine x 2)     Status: None (Preliminary result)   Collection Time: 02/21/18   8:12 AM  Result Value Ref Range Status   Specimen Description   Final    BLOOD RIGHT ANTECUBITAL Performed at [facility] [address] [place], [place] [address]    Special Requests   Final    BOTTLES DRAWN AEROBIC AND ANAEROBIC Blood Culture results may not be optimal due to an excessive volume of blood received in culture bottles Performed at [facility] [address], [place], [place] [address]    Culture   Final    NO GROWTH 2 DAYS Performed at [facility] [address] [place],  [address]    Report Status PENDING  Incomplete      Studies: No results found.  Scheduled Meds: . allopurinol  100 mg Per Tube Daily  . amLODipine  10 mg Per Tube Daily  . apixaban  5 mg Per Tube BID  . docusate  100 mg Per Tube BID  . feeding supplement (JEVITY 1.2 CAL)  474 mL Per Tube TID WC & HS  . feeding supplement (PRO-STAT SUGAR FREE 64)  30 mL Per Tube BID  . hydrochlorothiazide  12.5 mg Per Tube Daily  . insulin aspart  0-9 Units Subcutaneous TID WC  . magic mouthwash w/lidocaine  1 mL Oral TID  . nystatin  5 mL Oral QID  . pravastatin  40 mg Per Tube Daily  . sennosides  5 mL Per Tube BID  . vitamin B-12  500 mcg Per Tube BID    Continuous Infusions: . ceFEPime (MAXIPIME) IV Stopped (02/23/18 1115)  . vancomycin Stopped (02/23/18 0021)     LOS: 2 days     Kayleen Memos, MD [facility] Pager [phone]  If 7PM-7AM, please contact night-coverage [email or web address] Password [email or web address] 02/23/2018, 3:21 PM

## 2018-02-24 DIAGNOSIS — R5081 Fever presenting with conditions classified elsewhere: Secondary | ICD-10-CM

## 2018-02-24 DIAGNOSIS — Z7901 Long term (current) use of anticoagulants: Secondary | ICD-10-CM

## 2018-02-24 DIAGNOSIS — E1165 Type 2 diabetes mellitus with hyperglycemia: Secondary | ICD-10-CM

## 2018-02-24 DIAGNOSIS — I10 Essential (primary) hypertension: Secondary | ICD-10-CM

## 2018-02-24 DIAGNOSIS — I4891 Unspecified atrial fibrillation: Secondary | ICD-10-CM

## 2018-02-24 DIAGNOSIS — Z8249 Family history of ischemic heart disease and other diseases of the circulatory system: Secondary | ICD-10-CM

## 2018-02-24 DIAGNOSIS — L0390 Cellulitis, unspecified: Secondary | ICD-10-CM

## 2018-02-24 DIAGNOSIS — D709 Neutropenia, unspecified: Secondary | ICD-10-CM

## 2018-02-24 DIAGNOSIS — D630 Anemia in neoplastic disease: Secondary | ICD-10-CM

## 2018-02-24 DIAGNOSIS — D696 Thrombocytopenia, unspecified: Secondary | ICD-10-CM

## 2018-02-24 DIAGNOSIS — C01 Malignant neoplasm of base of tongue: Secondary | ICD-10-CM

## 2018-02-24 DIAGNOSIS — N179 Acute kidney failure, unspecified: Secondary | ICD-10-CM

## 2018-02-24 DIAGNOSIS — L598 Other specified disorders of the skin and subcutaneous tissue related to radiation: Secondary | ICD-10-CM

## 2018-02-24 DIAGNOSIS — Z978 Presence of other specified devices: Secondary | ICD-10-CM

## 2018-02-24 DIAGNOSIS — A419 Sepsis, unspecified organism: Secondary | ICD-10-CM

## 2018-02-24 DIAGNOSIS — B370 Candidal stomatitis: Secondary | ICD-10-CM

## 2018-02-24 LAB — GLUCOSE, RANDOM: Glucose, Bld: 391 mg/dL — ABNORMAL HIGH (ref 65–99)

## 2018-02-24 LAB — CBC WITH DIFFERENTIAL/PLATELET
BASOS ABS: 0 10*3/uL (ref 0.0–0.1)
Basophils Relative: 0 %
EOS ABS: 0 10*3/uL (ref 0.0–0.7)
Eosinophils Relative: 0 %
HEMATOCRIT: 26.1 % — AB (ref 39.0–52.0)
Hemoglobin: 8.8 g/dL — ABNORMAL LOW (ref 13.0–17.0)
LYMPHS ABS: 0.1 10*3/uL — AB (ref 0.7–4.0)
Lymphocytes Relative: 17 %
MCH: 28.9 pg (ref 26.0–34.0)
MCHC: 33.7 g/dL (ref 30.0–36.0)
MCV: 85.9 fL (ref 78.0–100.0)
MONOS PCT: 31 %
Monocytes Absolute: 0.2 10*3/uL (ref 0.1–1.0)
NEUTROS ABS: 0.4 10*3/uL — AB (ref 1.7–7.7)
Neutrophils Relative %: 52 %
Platelets: 102 10*3/uL — ABNORMAL LOW (ref 150–400)
RBC: 3.04 MIL/uL — ABNORMAL LOW (ref 4.22–5.81)
RDW: 14.6 % (ref 11.5–15.5)
WBC: 0.7 10*3/uL — CL (ref 4.0–10.5)

## 2018-02-24 LAB — CREATININE, SERUM
Creatinine, Ser: 0.76 mg/dL (ref 0.61–1.24)
GFR calc Af Amer: >60 mL/min (ref >60)
GFR calc non Af Amer: >60 mL/min (ref >60)

## 2018-02-24 LAB — GLUCOSE, CAPILLARY
Glucose-Capillary: 313 mg/dL — ABNORMAL HIGH (ref 65–99)
Glucose-Capillary: 282 mg/dL — ABNORMAL HIGH (ref 65–99)

## 2018-02-24 LAB — HEMOGLOBIN A1C
HEMOGLOBIN A1C: 7.7 % — AB (ref 4.8–5.6)
Mean Plasma Glucose: 174.29 mg/dL

## 2018-02-24 MED ORDER — ALBUTEROL SULFATE (2.5 MG/3ML) 0.083% IN NEBU
2.5000 mg | INHALATION_SOLUTION | RESPIRATORY_TRACT | Status: DC | PRN
Start: 2018-02-24 — End: 2018-03-01

## 2018-02-24 MED ORDER — INSULIN ASPART 100 UNIT/ML ~~LOC~~ SOLN
0.0000 [IU] | Freq: Three times a day (TID) | SUBCUTANEOUS | Status: DC
  Administered 2018-02-24: 4 [IU] via SUBCUTANEOUS
  Administered 2018-02-24 – 2018-02-25 (×2): 20 [IU] via SUBCUTANEOUS
  Administered 2018-02-25: 7 [IU] via SUBCUTANEOUS
  Administered 2018-02-25: 11 [IU] via SUBCUTANEOUS
  Administered 2018-02-26: 4 [IU] via SUBCUTANEOUS
  Administered 2018-02-26: 20 [IU] via SUBCUTANEOUS
  Administered 2018-02-26: 7 [IU] via SUBCUTANEOUS
  Administered 2018-02-27 (×2): 11 [IU] via SUBCUTANEOUS
  Administered 2018-02-27: 20 [IU] via SUBCUTANEOUS
  Administered 2018-02-28: 4 [IU] via SUBCUTANEOUS
  Administered 2018-02-28: 7 [IU] via SUBCUTANEOUS
  Administered 2018-02-28 – 2018-03-01 (×2): 15 [IU] via SUBCUTANEOUS
  Administered 2018-03-01: 7 [IU] via SUBCUTANEOUS

## 2018-02-24 MED ORDER — GUAIFENESIN 100 MG/5ML PO SOLN
15.0000 mL | Freq: Two times a day (BID) | ORAL | Status: DC
  Administered 2018-02-24 – 2018-03-01 (×11): 300 mg
  Filled 2018-02-24 (×4): qty 20
  Filled 2018-02-24: qty 10
  Filled 2018-02-24 (×2): qty 30
  Filled 2018-02-24 (×2): qty 10
  Filled 2018-02-24 (×2): qty 20

## 2018-02-24 MED ORDER — SODIUM CHLORIDE 3 % IN NEBU
4.0000 mL | INHALATION_SOLUTION | Freq: Every day | RESPIRATORY_TRACT | Status: AC
  Administered 2018-02-25 – 2018-02-26 (×2): 4 mL via RESPIRATORY_TRACT
  Filled 2018-02-24 (×3): qty 4

## 2018-02-24 MED ORDER — GUAIFENESIN ER 600 MG PO TB12: 600.0000 mg | ORAL_TABLET | Freq: Two times a day (BID) | ORAL | Status: DC

## 2018-02-24 MED ORDER — IPRATROPIUM-ALBUTEROL 0.5-2.5 (3) MG/3ML IN SOLN
3.0000 mL | Freq: Two times a day (BID) | RESPIRATORY_TRACT | Status: DC
  Administered 2018-02-24 – 2018-02-27 (×7): 3 mL via RESPIRATORY_TRACT
  Filled 2018-02-24 (×7): qty 3

## 2018-02-24 MED ORDER — IPRATROPIUM-ALBUTEROL 0.5-2.5 (3) MG/3ML IN SOLN
3.0000 mL | Freq: Four times a day (QID) | RESPIRATORY_TRACT | Status: DC
  Administered 2018-02-24: 3 mL via RESPIRATORY_TRACT
  Filled 2018-02-24: qty 3

## 2018-02-24 MED ORDER — TBO-FILGRASTIM 480 MCG/0.8ML ~~LOC~~ SOSY
480.0000 ug | PREFILLED_SYRINGE | Freq: Once | SUBCUTANEOUS | Status: AC
  Administered 2018-02-24: 480 ug via SUBCUTANEOUS
  Filled 2018-02-24: qty 0.8

## 2018-02-24 MED ORDER — INSULIN ASPART 100 UNIT/ML ~~LOC~~ SOLN
0.0000 [IU] | Freq: Every day | SUBCUTANEOUS | Status: DC
  Administered 2018-02-24: 3 [IU] via SUBCUTANEOUS
  Administered 2018-02-25: 5 [IU] via SUBCUTANEOUS
  Administered 2018-02-26: 4 [IU] via SUBCUTANEOUS
  Administered 2018-02-27: 2 [IU] via SUBCUTANEOUS
  Administered 2018-02-28: 3 [IU] via SUBCUTANEOUS

## 2018-02-24 NOTE — Progress Notes (Signed)
Edgar Herrera   DOB:05/18/1946   PI#:[id number]   AYT#:[id number]  Oncology follow-up note   subjective: I am covering Dr. Lebron Conners to see the patient.  I was called by hospitalist Dr. Nevada Crane this morning to evaluate his neutropenia and recurrent fever.  Patient states in the recliner at bedside, has copious clear oral secretion, mild throat pain, bilateral feet edema, he is on feeding tube. His last fever was this morning at 5am with T100.3.    Objective:  Vitals:   02/24/18 1341 02/24/18 1343  BP: (!) 102/54 (!) 114/49  Pulse: (!) 108 (!) 51  Resp: 15   Temp: 98.8 F (37.1 C)   SpO2: 99% 100%    Body mass index is 28.89 kg/m.  Intake/Output Summary (Last 24 hours) at 02/24/2018 1429 Last data filed at 02/24/2018 0559 Gross per 24 hour  Intake 1000 ml  Output -  Net 1000 ml     Sclerae unicteric  Diffuse skin erythema, peeling in the neck and upper chest.  Diffuse papular rash on his arms and legs.  No peripheral adenopathy  Lungs clear -- no rales or rhonchi  Heart regular rate and rhythm  Abdomen benign  MSK no focal spinal tenderness, no peripheral edema  Neuro nonfocal  LE: Bilateral pitting edema ON feet  CBG (last 3)  Recent Labs    02/23/18 1712 02/23/18 2152 02/24/18 0746  GLUCAP 298* 299* 313*     Labs:  Lab Results  Component Value Date   WBC 0.7 (LL) 02/24/2018   HGB 8.8 (L) 02/24/2018   HCT 26.1 (L) 02/24/2018   MCV 85.9 02/24/2018   PLT 102 (L) 02/24/2018   NEUTROABS 0.4 (L) 02/24/2018   CMP Latest Ref Rng & Units 02/24/2018 02/23/2018 02/22/2018  Glucose 65 - 99 mg/dL 391(H) - 340(H)  BUN 6 - 20 mg/dL - - 27(H)  Creatinine 0.61 - 1.24 mg/dL 0.76 0.81 1.01  Sodium 135 - 145 mmol/L - - 134(L)  Potassium 3.5 - 5.1 mmol/L - - 4.8  Chloride 101 - 111 mmol/L - - 100(L)  CO2 22 - 32 mmol/L - - 23  Calcium 8.9 - 10.3 mg/dL - - 8.1(L)  Total Protein 6.5 - 8.1 g/dL - - -  Total Bilirubin 0.3 - 1.2 mg/dL - - -  Alkaline Phos 38 - 126 U/L - - -  AST 15 - 41  U/L - - -  ALT 17 - 63 U/L - - -     Urine Studies No results for input(s): UHGB, CRYS in the last 72 hours.  Invalid input(s): UACOL, UAPR, USPG, UPH, UTP, UGL, UKET, UBIL, UNIT, UROB, ULEU, UEPI, UWBC, URBC, UBAC, CAST, [place], [place]  Basic Metabolic Panel: Recent Labs  Lab 02/21/18 3557 02/22/18 0358 02/23/18 0830 02/24/18 0500 02/24/18 1222  NA 132* 134*  --   --   --   K 5.3* 4.8  --   --   --   CL 97* 100*  --   --   --   CO2 26 23  --   --   --   GLUCOSE 255* 340*  --   --  391*  BUN 39* 27*  --   --   --   CREATININE 1.22 1.01 0.81 0.76  --   CALCIUM 8.4* 8.1*  --   --   --    GFR Estimated Creatinine Clearance: 90.6 mL/min (by C-G formula based on SCr of 0.76 mg/dL). Liver Function Tests: Recent Labs  Lab  02/21/18 0642  AST 15  ALT 22  ALKPHOS 51  BILITOT 0.9  PROT 6.2*  ALBUMIN 2.7*   No results for input(s): LIPASE, AMYLASE in the last 168 hours. No results for input(s): AMMONIA in the last 168 hours. Coagulation profile No results for input(s): INR, PROTIME in the last 168 hours.  CBC: Recent Labs  Lab 02/21/18 0642 02/22/18 0358 02/23/18 0830 02/24/18 0500  WBC 1.2* 1.3* 1.0* 0.7*  NEUTROABS 0.9*  --  0.6* 0.4*  HGB 10.3* 9.9* 9.7* 8.8*  HCT 30.9* 29.8* 29.1* 26.1*  MCV 85.8 87.4 84.6 85.9  PLT 114* 105* 110* 102*   Cardiac Enzymes: No results for input(s): CKTOTAL, CKMB, CKMBINDEX, TROPONINI in the last 168 hours. BNP: Invalid input(s): POCBNP CBG: Recent Labs  Lab 02/23/18 0751 02/23/18 1208 02/23/18 1712 02/23/18 2152 02/24/18 0746  GLUCAP 203* 245* 298* 299* 313*   D-Dimer No results for input(s): DDIMER in the last 72 hours. Hgb A1c No results for input(s): HGBA1C in the last 72 hours. Lipid Profile No results for input(s): CHOL, HDL, LDLCALC, TRIG, CHOLHDL, LDLDIRECT in the last 72 hours. Thyroid function studies No results for input(s): TSH, T4TOTAL, T3FREE, THYROIDAB in the last 72 hours.  Invalid input(s):  FREET3 Anemia work up No results for input(s): VITAMINB12, FOLATE, FERRITIN, TIBC, IRON, RETICCTPCT in the last 72 hours. Microbiology Recent Results (from the past 240 hour(s))  Blood Culture (routine x 2)     Status: None (Preliminary result)   Collection Time: 02/21/18  6:43 AM  Result Value Ref Range Status   Specimen Description   Final    BLOOD LEFT ANTECUBITAL Performed at [facility] [address], [place], [place] [address]    Special Requests   Final    BOTTLES DRAWN AEROBIC AND ANAEROBIC Blood Culture adequate volume Performed at [employer] [facility] [address], [place], [place] [address]    Culture   Final    NO GROWTH 3 DAYS Performed at [facility], [place] [address], [place], [place] [address]    Report Status PENDING  Incomplete  Blood Culture (routine x 2)     Status: None (Preliminary result)   Collection Time: 02/21/18  8:12 AM  Result Value Ref Range Status   Specimen Description   Final    BLOOD RIGHT ANTECUBITAL Performed at [facility] [address] [place], [place] [address]    Special Requests   Final    BOTTLES DRAWN AEROBIC AND ANAEROBIC Blood Culture results may not be optimal due to an excessive volume of blood received in culture bottles Performed at [facility] [address] [place], [place] [address]    Culture   Final    NO GROWTH 3 DAYS Performed at [facility], [place] [address], [place],  [address]    Report Status PENDING  Incomplete      Studies:  No results found.  Assessment: 72 y.o. 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  Neutropenic fever, secondary to chemotherapy, source of infection unclear 2.  Persistent neutropenia, ANC 0.4 today, trending down 3.  Moderate anemia and mild thrombocytopenia, secondary  to chemotherapy 4.  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  Diabetes with hyperglycemia 6. HTN  7. AF on chronic anticoagulation 8.  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 Isidore Moos -I agree with the broad antibiotics, he is also being treated for oral thrush.  Infectious disease consult was called today. -continue supportive care  -I encouraged him to elevate his legs for leg edema -Dr. Lebron Conners will resume care tomorrow   Truitt Merle, MD 02/24/2018  2:29 PM

## 2018-02-24 NOTE — Plan of Care (Signed)
Pt receives tube feed daily. Pt states feels improved.

## 2018-02-24 NOTE — Progress Notes (Signed)
[facility] Follow-up Visit:  Assessment: Squamous cell carcinoma of base of tongue ([place]) 72 y.o.  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  Agree with the current clinical stage II based on clinical T3, clinical N1 disease.  Based on the age and comorbidities, patient is not a candidate for cisplatin chemotherapy, but will likely be able to tolerate concurrent weekly carboplatin/paclitaxel combination administered with radiotherapy.  Patient presents to the clinic to continue systemic therapy therapy.  Appears to be tolerating infusion well so far.  Mild lightheadedness and dizziness in the morning likely due to hypovolemia considering a 4 pound weight loss over the past week.  Clinical evaluation lab work-up permissive to proceed with the next cycle of systemic chemotherapy.  Plan: -Proceed with systemic therapy with carboplatin/paclitaxel, week #3. -We will replace potassium due to hypokalemia. -Return to clinic in 1 week: Labs, possible next dose of systemic chemotherapy.   Voice recognition software was used and creation of this note. Despite my best effort at editing the text, some misspelling/errors may have occurred.  Orders Placed This Encounter  Procedures  . CBC with Differential (Cancer Center Only)    Standing Status:   Future    Number of Occurrences:   1    Standing Expiration Date:   02/02/2019  . CMP ([facility] only)    Standing Status:   Future    Number of Occurrences:   1    Standing Expiration Date:   02/02/2019  . Magnesium    Standing Status:   Future    Number of Occurrences:   1    Standing Expiration Date:   02/01/2019  . Phosphorus    Standing Status:   Future    Number of Occurrences:   1    Standing Expiration Date:   02/01/2019    Cancer Staging Squamous cell carcinoma of base of tongue ([place]) Staging form: Pharynx - HPV-Mediated Oropharynx, AJCC 8th  Edition - Clinical: Stage II (cT3, cN1, cM0, p16+) - Signed by [name], MD on 01/01/2018   All questions were answered.  . The patient knows to call the clinic with any problems, questions or concerns.  This note was electronically signed.    History of Presenting Illness Edgar Herrera is a 72 y.o. male followed in the [place] for diagnosis of squamous cell carcinoma of tongue base, referred by Dr. [name]. Patient initially presented to a [place] emergency department 11/29/17 with complaints of intermittent dysphasia and hemoptysis episode.  CT scan of the neck obtained at the time demonstrated a large exophytic mass. Patient was seen by Dr.S Isac Caddy at [facility] on 12/10/17 and underwent a fiberoptic laryngoscopy on 12/17/17.  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  Additional staging imaging was obtained with PET/CT demonstrating no evidence of distant metastatic disease, but Lt base of the tongue mass, ipsilateral adenopathy.  Patient was evaluated by [facility] for possible tors procedure and was not found to be a suitable candidate according to Dr. Conley Canal.  Patient was subsequently evaluated by Dr. [name] who has referred the patient to our clinic for possible concurrent multimodality therapy.  Patient presents to the clinic today to continue systemic component of his treatment with carboplatin and paclitaxel. Patient denies any new complaints since the last visit to the clinic with exception of mild vertigo in the morning  over the past couple of days.  Oncological/hematological History:   Squamous cell carcinoma of base of tongue ([place])   12/17/2017 Initial Diagnosis    Squamous cell carcinoma of base of tongue ([place]):  -- laryngoscopic biopsy of the mass at the base of the tongue base positive for invasive poorly differentiated carcinoma positive for  p40 and p16      01/01/2018 Cancer Staging    Staging form: Pharynx - HPV-Mediated Oropharynx, AJCC 8th Edition - Clinical: Stage II (cT3, cN1, cM0, p16+) - Signed by [name], MD on 01/01/2018       01/16/2018 -  Radiation Therapy    Dr [name]:      01/18/2018 -  Chemotherapy    Carboplatin AUC 2, d1 + paclitaxel 32m/m2, d1 QWk --Week #1, 01/18/18: --Week #2, 01/24/18: --Week #3, 02/01/18:       Medical History: Past Medical History:  Diagnosis Date  . Allergic rhinitis   . Atrial fibrillation ([facility]   . Diabetes mellitus without complication ([facility]   . HLD (hyperlipidemia) 02/21/2018  . Hyperlipidemia   . Hypertension   . Prostate cancer ([facility]   . T2DM (type 2 diabetes mellitus) ([facility] 02/21/2018    Surgical History: Past Surgical History:  Procedure Laterality Date  . KNEE SURGERY Bilateral    2007 and 1998,   . PROSTATECTOMY  12/15/2006  . SKIN GRAFT     as a child left elbow  . TONSILECTOMY/ADENOIDECTOMY WITH MYRINGOTOMY     as a child    Family History: Family History  Problem Relation Age of Onset  . Lung cancer Mother   . Hypertension Mother   . Breast cancer Mother   . Heart attack Father   . Hypertension Sister   . Hypertension Brother   . Hypertension Sister     Social History: Social History   Socioeconomic History  . Marital status: Married    Spouse name: Not on file  . Number of children: Not on file  . Years of education: Not on file  . Highest education level: Not on file  Occupational History  . Not on file  Social Needs  . Financial resource strain: Not on file  . Food insecurity:    Worry: Not on file    Inability: Not on file  . Transportation needs:    Medical: Not on file    Non-medical: Not on file  Tobacco Use  . Smoking status: Never Smoker  . Smokeless tobacco: Never Used  Substance and Sexual Activity  . Alcohol use: Yes    Comment: reports occasional beer or wine. maybe two times monthly  . Drug use:  No  . Sexual activity: Not on file  Lifestyle  . Physical activity:    Days per week: Not on file    Minutes per session: Not on file  . Stress: Not on file  Relationships  . Social connections:    Talks on phone: Not on file    Gets together: Not on file    Attends religious service: Not on file    Active member of club or organization: Not on file    Attends meetings of clubs or organizations: Not on file    Relationship status: Not on file  . Intimate partner violence:    Fear of current or ex partner: Not on file    Emotionally abused: Not on file    Physically abused: Not on file    Forced sexual activity: Not on file  Other Topics Concern  . Not on file  Social History Narrative  . Not on file    Allergies: No Known Allergies  Medications:  No current facility-administered medications for this visit.    No current outpatient medications on file.   Facility-Administered Medications Ordered in Other Visits  Medication Dose Route Frequency Provider Last Rate Last Dose  . acetaminophen (TYLENOL) solution 650 mg  650 mg Oral Q6H PRN Purohit, Shrey C, MD   650 mg at 02/24/18 0654   Or  . acetaminophen (TYLENOL) suppository 650 mg  650 mg Rectal Q4H PRN Purohit, Konrad Dolores, MD   650 mg at 02/22/18 2138  . albuterol (PROVENTIL) (2.5 MG/3ML) 0.083% nebulizer solution 2.5 mg  2.5 mg Nebulization Q4H PRN Irene Pap N, DO      . allopurinol (ZYLOPRIM) tablet 100 mg  100 mg Per Tube Daily Purohit, Konrad Dolores, MD   100 mg at 02/24/18 0837  . amLODipine (NORVASC) tablet 10 mg  10 mg Per Tube Daily Purohit, Konrad Dolores, MD   10 mg at 02/24/18 0838  . apixaban (ELIQUIS) tablet 5 mg  5 mg Per Tube BID Purohit, Konrad Dolores, MD   5 mg at 02/24/18 0837  . ceFEPIme (MAXIPIME) 2 g in sodium chloride 0.9 % 100 mL IVPB  2 g Intravenous Q8H Pham, Anh P, RPH   Stopped at 02/24/18 1017  . docusate (COLACE) 50 MG/5ML liquid 100 mg  100 mg Per Tube BID Purohit, Shrey C, MD   100 mg at 02/21/18 2118  .  feeding supplement (JEVITY 1.2 CAL) liquid 474 mL  474 mL Per Tube TID WC & HS Kayleen Memos, DO 237 mL/hr at 02/24/18 0838 474 mL at 02/24/18 0838  . feeding supplement (PRO-STAT SUGAR FREE 64) liquid 30 mL  30 mL Per Tube BID Irene Pap N, DO   30 mL at 02/24/18 0835  . fluconazole (DIFLUCAN) IVPB 400 mg  400 mg Intravenous Q24H Kayleen Memos, DO   Stopped at 02/23/18 2122  . guaiFENesin (MUCINEX) 12 hr tablet 600 mg  600 mg Oral BID Irene Pap N, DO      . guaiFENesin (ROBITUSSIN) 100 MG/5ML solution 100 mg  5 mL Oral Q4H PRN Purohit, Shrey C, MD   100 mg at 02/21/18 2117  . hydrochlorothiazide (HYDRODIURIL) tablet 12.5 mg  12.5 mg Per Tube Daily Purohit, Shrey C, MD   12.5 mg at 02/24/18 0837  . HYDROcodone-acetaminophen (HYCET) 7.5-325 mg/15 ml solution 10 mL  10 mL Oral QID PRN Purohit, Shrey C, MD      . insulin aspart (novoLOG) injection 0-9 Units  0-9 Units Subcutaneous TID WC Purohit, Konrad Dolores, MD   7 Units at 02/24/18 0850  . ipratropium-albuterol (DUONEB) 0.5-2.5 (3) MG/3ML nebulizer solution 3 mL  3 mL Nebulization BID Irene Pap N, DO      . LORazepam (ATIVAN) tablet 0.5 mg  0.5 mg Oral Q6H PRN Purohit, Shrey C, MD      . magic mouthwash w/lidocaine  1 mL Oral TID Irene Pap N, DO   1 mL at 02/24/18 6010  . morphine 4 MG/ML injection 4 mg  4 mg Intravenous Q4H PRN Purohit, Konrad Dolores, MD   4 mg at 02/23/18 2120  . nystatin (MYCOSTATIN) 100000 UNIT/ML suspension 500,000 Units  5 mL Oral QID Irene Pap N, DO   500,000 Units at 02/24/18 9323  . ondansetron (ZOFRAN) tablet 4 mg  4 mg Oral Q6H PRN Purohit, Brigid Re  C, MD       Or  . ondansetron (ZOFRAN) injection 4 mg  4 mg Intravenous Q6H PRN Purohit, Konrad Dolores, MD   4 mg at 02/21/18 2117  . polyethylene glycol (MIRALAX / GLYCOLAX) packet 17 g  17 g Oral Daily PRN Purohit, Shrey C, MD      . polyvinyl alcohol (LIQUIFILM TEARS) 1.4 % ophthalmic solution 1 drop  1 drop Both Eyes PRN Purohit, Shrey C, MD      . pravastatin (PRAVACHOL)  tablet 40 mg  40 mg Per Tube Daily Purohit, Shrey C, MD   40 mg at 02/24/18 0837  . sennosides (SENOKOT) 8.8 MG/5ML syrup 5 mL  5 mL Per Tube BID Purohit, Shrey C, MD   5 mL at 02/21/18 2118  . sodium chloride HYPERTONIC 3 % nebulizer solution 4 mL  4 mL Nebulization Daily Hall, Carole N, DO      . vancomycin (VANCOCIN) 1,500 mg in sodium chloride 0.9 % 500 mL IVPB  1,500 mg Intravenous Q24H Lynelle Doctor, RPH   Stopped at 02/24/18 0000  . vitamin B-12 (CYANOCOBALAMIN) tablet 500 mcg  500 mcg Per Tube BID Purohit, Konrad Dolores, MD   500 mcg at 02/24/18 7681    Review of Systems: Review of Systems  Neurological: Positive for dizziness.  All other systems reviewed and are negative.    PHYSICAL EXAMINATION Blood pressure 115/65, pulse 78, temperature 98.5 F (36.9 C), temperature source Oral, resp. rate 18, height 5' 8" (1.727 m), weight 206 lb 8 oz (93.7 kg), SpO2 95 %.  ECOG PERFORMANCE STATUS: 1 - Symptomatic but completely ambulatory  Physical Exam  Constitutional: He is oriented to person, place, and time. He appears well-developed and well-nourished. No distress.  HENT:  Head: Normocephalic and atraumatic.  Mouth/Throat: Oropharynx is clear and moist. No oropharyngeal exudate.  Eyes: Pupils are equal, round, and reactive to light. Conjunctivae and EOM are normal. No scleral icterus.  Neck: No thyromegaly present.  Cardiovascular: Normal rate, regular rhythm, normal heart sounds and intact distal pulses.  No murmur heard. Pulmonary/Chest: Effort normal and breath sounds normal. No stridor. No respiratory distress. He has no wheezes. He has no rales.  Abdominal: Soft. Bowel sounds are normal. He exhibits no distension and no mass. There is no tenderness. There is no guarding.  Musculoskeletal: He exhibits no edema.  Lymphadenopathy:    He has cervical adenopathy.  Neurological: He is alert and oriented to person, place, and time. He displays normal reflexes. No cranial nerve deficit or  sensory deficit.  Skin: Skin is warm and dry. No rash noted. He is not diaphoretic. No erythema. No pallor.     LABORATORY DATA: I have personally reviewed the data as listed: Appointment on 02/01/2018  Component Date Value Ref Range Status  . TSH 02/01/2018 0.557  0.320 - 4.118 uIU/mL Final   Performed at [facility], [place] [address] [place], [place] [address]  . Sodium 02/01/2018 138  136 - 145 mmol/L Final  . Potassium 02/01/2018 3.4* 3.5 - 5.1 mmol/L Final  . Chloride 02/01/2018 101  98 - 109 mmol/L Final  . CO2 02/01/2018 28  22 - 29 mmol/L Final  . Glucose, Bld 02/01/2018 162* 70 - 140 mg/dL Final  . BUN 02/01/2018 15  7 - 26 mg/dL Final  . Creatinine, Ser 02/01/2018 0.95  0.70 - 1.30 mg/dL Final  . Calcium 02/01/2018 9.1  8.4 - 10.4 mg/dL Final  . Total Protein 02/01/2018  6.5  6.4 - 8.3 g/dL Final  . Albumin 02/01/2018 3.0* 3.5 - 5.0 g/dL Final  . AST 02/01/2018 18  5 - 34 U/L Final  . ALT 02/01/2018 24  0 - 55 U/L Final  . Alkaline Phosphatase 02/01/2018 71  40 - 150 U/L Final  . Total Bilirubin 02/01/2018 0.4  0.2 - 1.2 mg/dL Final  . GFR calc non Af Amer 02/01/2018 >60  >60 mL/min Final  . GFR calc Af Amer 02/01/2018 >60  >60 mL/min Final   Comment: (NOTE) The eGFR has been calculated using the CKD EPI equation. This calculation has not been validated in all clinical situations. eGFR's persistently <60 mL/min signify possible Chronic Kidney Disease.   Georgiann Hahn gap 02/01/2018 9  3 - 11 Final   Performed at [facility], [place] [address], [place], [place] [address]  . WBC 02/01/2018 3.6* 4.0 - 10.3 K/uL Final  . RBC 02/01/2018 4.41  4.20 - 5.82 MIL/uL Final  . Hemoglobin 02/01/2018 12.5* 13.0 - 17.1 g/dL Final  . HCT 02/01/2018 37.9* 38.4 - 49.9 % Final  . MCV 02/01/2018 86.0  79.3 - 98.0 fL Final  . MCH 02/01/2018 28.4  27.2 - 33.4 pg Final  . MCHC 02/01/2018 33.0  32.0 - 36.0 g/dL Final  . RDW 02/01/2018 13.6  11.0 -  14.6 % Final  . Platelets 02/01/2018 174  140 - 400 K/uL Final  . Neutrophils Relative % 02/01/2018 76  % Final  . Neutro Abs 02/01/2018 2.8  1.5 - 6.5 K/uL Final  . Lymphocytes Relative 02/01/2018 13  % Final  . Lymphs Abs 02/01/2018 0.5* 0.9 - 3.3 K/uL Final  . Monocytes Relative 02/01/2018 9  % Final  . Monocytes Absolute 02/01/2018 0.3  0.1 - 0.9 K/uL Final  . Eosinophils Relative 02/01/2018 1  % Final  . Eosinophils Absolute 02/01/2018 0.0  0.0 - 0.5 K/uL Final  . Basophils Relative 02/01/2018 1  % Final  . Basophils Absolute 02/01/2018 0.0  0.0 - 0.1 K/uL Final   Performed at [facility], [place] [address], [place], [place] [address]       Ardath Sax, MD

## 2018-02-24 NOTE — Progress Notes (Signed)
Inpatient Diabetes Program Recommendations  AACE/ADA: New Consensus Statement on Inpatient Glycemic Control (2015)  Target Ranges:  Prepandial:   less than 140 mg/dL      Peak postprandial:   less than 180 mg/dL (1-2 hours)      Critically ill patients:  140 - 180 mg/dL   Lab Results  Component Value Date   GLUCAP 313 (H) 02/24/2018   HGBA1C 7.7 (H) 02/24/2018    Review of Glycemic Control  Diabetes history: DM2 Outpatient Diabetes medications: glipizide 5 mg in am and 2.5 mg QPM, metformin 1000 mg bid. Current orders for Inpatient glycemic control: Novolog 0-20 units tidwc and hs TF: Jevity 237 ml tidwc and hs HgbA1C - 7.7%  Inpatient Diabetes Program Recommendations:     Add Lantus 20 units Q24H Add TF coverage - Novolog 3 units tidwc and hs (with enteral feedings)  Continue to follow. Monitor blood sugars.  Thank you. Lorenda Peck, RD, LDN, CDE Inpatient Diabetes Coordinator [phone]

## 2018-02-24 NOTE — Consult Note (Signed)
[facility] for Infectious Disease    Date of Admission:  02/21/2018   Total days of antibiotics: 3 vanco/cefepime,  1 fluconazole               Reason for Consult: celulitis, fever    Referring Provider: [place] Crane   Assessment: Neutropenic Fever SCCa of throat Thrush AKI Feeding tube Port  Plan: 1. Continue his current anbx and antifungal.  2. Await response to granix.  3. Continue to follow [place] (currently 400).  4. Stop nystatin 5. Follow BCx (ngtd 5-16, repeat 5-19 pending) 6. AKI resolved 7. Wall suction device to help him clear his secretions.   Comment- hopefully as his [place] improves his temps will also improve.  His neck and his port do not currently appear infected.    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 allopurinol  100 mg Per Tube Daily  . amLODipine  10 mg Per Tube Daily  . apixaban  5 mg Per Tube BID  . docusate  100 mg Per Tube BID  . feeding supplement (JEVITY 1.2 CAL)  474 mL Per Tube TID WC & HS  . feeding supplement (PRO-STAT SUGAR FREE 64)  30 mL Per Tube BID  . guaiFENesin  15 mL Per Tube BID  . hydrochlorothiazide  12.5 mg Per Tube Daily  . insulin aspart  0-20 Units Subcutaneous TID WC  . insulin aspart  0-5 Units Subcutaneous QHS  . insulin aspart  0-9 Units Subcutaneous TID WC  . ipratropium-albuterol  3 mL Nebulization BID  . magic mouthwash w/lidocaine  1 mL Oral TID  . nystatin  5 mL Oral QID  . pravastatin  40 mg Per Tube Daily  . sennosides  5 mL Per Tube BID  . sodium chloride HYPERTONIC  4 mL Nebulization Daily  . Tbo-filgastrim (GRANIX) SQ  480 mcg Subcutaneous Once  . vitamin B-12  500 mcg Per Tube BID     HPI: Edgar Herrera is a 72 y.o. male with hx of SCCa of throat. He last received CTX (carboplatin/paclitaxcel 5-6), his second cycle. Prior to this he had low grade temps (per his wife to 100.4). He was given amoxil fo rthis.  He then developed higher temps at home that have persisted despite starting bactrim on 5-10.  He came to ED 5-16 where he had temp 102, and and ANC of 900. He was also noted to have increased Cr (1.22 from 0.7). His CXR was (-).  He was noted to have erythema over his neck, most likely felt to be from his XRT.  He was started on vanco/cefepime. On 5-18 he had fluconazole added for thrush.  He was seen by H/O today and started on granix (GCSF).   He has a L port placed 01-2018.   Review of Systems: Review of Systems  Constitutional: Positive for fever.  Respiratory: Positive for cough and sputum production. Negative for shortness of breath.   Gastrointestinal: Negative for constipation, diarrhea, nausea and vomiting.  Genitourinary: Negative for dysuria.  Skin: Positive for rash.  Please see HPI. All other systems reviewed and negative.   Past Medical History:  Diagnosis Date  . Allergic rhinitis   . Atrial fibrillation ([place])   . Diabetes mellitus without complication ([place])   . HLD (hyperlipidemia) 02/21/2018  . Hyperlipidemia   . Hypertension   . Prostate cancer ([place])   . T2DM (type 2 diabetes mellitus) ([place]) 02/21/2018    Social History   Tobacco Use  . Smoking status: Never Smoker  . Smokeless tobacco: Never Used  Substance Use Topics  . Alcohol use: Yes    Comment: reports occasional beer or wine. maybe two times monthly  . Drug use: No    Family History  Problem Relation Age of Onset  . Lung cancer Mother   . Hypertension Mother   . Breast cancer Mother   . Heart attack Father   . Hypertension Sister   . Hypertension Brother   . Hypertension Sister      Medications:  Scheduled: . allopurinol  100 mg Per Tube Daily  . amLODipine  10  mg Per Tube Daily  . apixaban  5 mg Per Tube BID  . docusate  100 mg Per Tube BID  . feeding supplement (JEVITY 1.2 CAL)  474 mL Per Tube TID WC & HS  . feeding supplement (PRO-STAT SUGAR FREE 64)  30 mL Per Tube BID  . guaiFENesin  15 mL Per Tube BID  . hydrochlorothiazide  12.5 mg Per Tube Daily  . insulin aspart  0-20 Units Subcutaneous TID WC  . insulin aspart  0-5 Units Subcutaneous QHS  . insulin aspart  0-9 Units Subcutaneous TID WC  . ipratropium-albuterol  3 mL Nebulization BID  . magic mouthwash w/lidocaine  1 mL Oral TID  . nystatin  5 mL Oral QID  . pravastatin  40 mg Per Tube Daily  . sennosides  5 mL Per Tube BID  . sodium chloride HYPERTONIC  4 mL Nebulization Daily  . Tbo-filgastrim (GRANIX) SQ  480 mcg Subcutaneous Once  . vitamin B-12  500 mcg Per Tube BID    Abtx:  Anti-infectives (From admission, onward)   Start     Dose/Rate Route Frequency Ordered Stop   02/23/18 1700  fluconazole (DIFLUCAN) IVPB 400 mg    Note to Pharmacy:  For oropharyngeal candidiasis   400 mg 100 mL/hr over 120 Minutes Intravenous Every 24 hours 02/23/18 1538 02/26/18 1659   02/23/18 1545  fluconazole (DIFLUCAN) IVPB 400 mg  Status:  Discontinued     400 mg 100 mL/hr over 120 Minutes Intravenous Every 24 hours 02/23/18 1537 02/23/18 1538   02/22/18 2200  vancomycin (VANCOCIN) 1,500 mg in sodium chloride 0.9 % 500 mL IVPB     1,500 mg 250 mL/hr over 120 Minutes Intravenous Every 24 hours 02/22/18 0948     02/21/18 2200  vancomycin (VANCOCIN) 1,250 mg in sodium chloride 0.9 % 250 mL IVPB  Status:  Discontinued     1,250 mg 166.7 mL/hr over 90 Minutes Intravenous Every 24 hours 02/21/18 0934 02/22/18 0948   02/21/18 1700  ceFEPIme (MAXIPIME) 2 g in sodium chloride 0.9 % 100 mL IVPB     2 g 200 mL/hr over 30 Minutes Intravenous Every 8 hours 02/21/18 1013     02/21/18 1400  ceFEPIme (MAXIPIME) 2 g in sodium chloride 0.9 % 100 mL IVPB  Status:  Discontinued     2 g 200 mL/hr over 30  Minutes Intravenous Every 8 hours 02/21/18 1012 02/21/18 1013   02/21/18 0800  ceFEPIme (MAXIPIME) 2 g in sodium chloride 0.9 % 100 mL  IVPB     2 g 200 mL/hr over 30 Minutes Intravenous  Once 02/21/18 0752 02/21/18 0956   02/21/18 0800  vancomycin (VANCOCIN) IVPB 1000 mg/200 mL premix     1,000 mg 200 mL/hr over 60 Minutes Intravenous  Once 02/21/18 0758 02/21/18 1450        OBJECTIVE: Blood pressure (!) 114/49, pulse (!) 51, temperature 98.8 F (37.1 C), temperature source Oral, resp. rate 15, height 5' 9"  (1.753 m), weight 88.7 kg (195 lb 9.6 oz), SpO2 100 %.  Physical Exam  Constitutional: He appears well-developed and well-nourished.  HENT:  Mouth/Throat: Oropharyngeal exudate present.  Eyes: Pupils are equal, round, and reactive to light. EOM are normal.  Neck: Neck supple.    Cardiovascular: Normal rate, regular rhythm and normal heart sounds.  Pulmonary/Chest: Effort normal and breath sounds normal.  Abdominal: Soft. Bowel sounds are normal. He exhibits no distension. There is no tenderness.    Musculoskeletal: He exhibits edema.  Lymphadenopathy:    He has no cervical adenopathy.  Skin:       Lab Results Results for orders placed or performed during the hospital encounter of 02/21/18 (from the past 48 hour(s))  Glucose, capillary     Status: Abnormal   Collection Time: 02/22/18  5:40 PM  Result Value Ref Range   Glucose-Capillary 155 (H) 65 - 99 mg/dL   Comment 1 Notify RN    Comment 2 Document in Chart   Glucose, capillary     Status: Abnormal   Collection Time: 02/22/18  9:54 PM  Result Value Ref Range   Glucose-Capillary 293 (H) 65 - 99 mg/dL  Glucose, capillary     Status: Abnormal   Collection Time: 02/23/18  7:51 AM  Result Value Ref Range   Glucose-Capillary 203 (H) 65 - 99 mg/dL   Comment 1 Notify RN    Comment 2 Document in Chart   Creatinine, serum     Status: None   Collection Time: 02/23/18  8:30 AM  Result Value Ref Range   Creatinine,  Ser 0.81 0.61 - 1.24 mg/dL   GFR calc non Af Amer >60 >60 mL/min   GFR calc Af Amer >60 >60 mL/min    Comment: (NOTE) The eGFR has been calculated using the CKD EPI equation. This calculation has not been validated in all clinical situations. eGFR's persistently <60 mL/min signify possible Chronic Kidney Disease. Performed at [facility], [place] [address], [place], [place] [address]   CBC with Differential     Status: Abnormal   Collection Time: 02/23/18  8:30 AM  Result Value Ref Range   WBC 1.0 (LL) 4.0 - 10.5 K/uL    Comment: CRITICAL VALUE NOTED.  VALUE IS CONSISTENT WITH PREVIOUSLY REPORTED AND CALLED VALUE. DELTA CHECK NOTED    RBC 3.44 (L) 4.22 - 5.81 MIL/uL   Hemoglobin 9.7 (L) 13.0 - 17.0 g/dL   HCT 29.1 (L) 39.0 - 52.0 %   MCV 84.6 78.0 - 100.0 fL   MCH 28.2 26.0 - 34.0 pg   MCHC 33.3 30.0 - 36.0 g/dL   RDW 14.3 11.5 - 15.5 %   Platelets 110 (L) 150 - 400 K/uL    Comment: DELTA CHECK NOTED CONSISTENT WITH PREVIOUS RESULT    Neutrophils Relative % 61 %   Lymphocytes Relative 18 %   Monocytes Relative 21 %   Eosinophils Relative 0 %   Basophils Relative 0 %   Neutro Abs 0.6 (L) 1.7 - 7.7 K/uL  Lymphs Abs 0.2 (L) 0.7 - 4.0 K/uL   Monocytes Absolute 0.2 0.1 - 1.0 K/uL   Eosinophils Absolute 0.0 0.0 - 0.7 K/uL   Basophils Absolute 0.0 0.0 - 0.1 K/uL   RBC Morphology POLYCHROMASIA PRESENT    WBC Morphology TOO FEW TO COUNT, SMEAR AVAILABLE FOR REVIEW     Comment: Performed at [facility], [place] [address], [place], [place] [address]  Glucose, capillary     Status: Abnormal   Collection Time: 02/23/18 12:08 PM  Result Value Ref Range   Glucose-Capillary 245 (H) 65 - 99 mg/dL   Comment 1 Notify RN    Comment 2 Document in Chart   Glucose, capillary     Status: Abnormal   Collection Time: 02/23/18  5:12 PM  Result Value Ref Range   Glucose-Capillary 298 (H) 65 - 99 mg/dL   Comment 1 Notify RN    Comment 2 Document in Chart    Glucose, capillary     Status: Abnormal   Collection Time: 02/23/18  9:52 PM  Result Value Ref Range   Glucose-Capillary 299 (H) 65 - 99 mg/dL  Creatinine, serum     Status: None   Collection Time: 02/24/18  5:00 AM  Result Value Ref Range   Creatinine, Ser 0.76 0.61 - 1.24 mg/dL   GFR calc non Af Amer >60 >60 mL/min   GFR calc Af Amer >60 >60 mL/min    Comment: (NOTE) The eGFR has been calculated using the CKD EPI equation. This calculation has not been validated in all clinical situations. eGFR's persistently <60 mL/min signify possible Chronic Kidney Disease. Performed at [facility], [place] [address], [place], [place] [address]   CBC with Differential     Status: Abnormal   Collection Time: 02/24/18  5:00 AM  Result Value Ref Range   WBC 0.7 (LL) 4.0 - 10.5 K/uL    Comment: REPEATED TO VERIFY CRITICAL VALUE NOTED.  VALUE IS CONSISTENT WITH PREVIOUSLY REPORTED AND CALLED VALUE.    RBC 3.04 (L) 4.22 - 5.81 MIL/uL   Hemoglobin 8.8 (L) 13.0 - 17.0 g/dL   HCT 26.1 (L) 39.0 - 52.0 %   MCV 85.9 78.0 - 100.0 fL   MCH 28.9 26.0 - 34.0 pg   MCHC 33.7 30.0 - 36.0 g/dL   RDW 14.6 11.5 - 15.5 %   Platelets 102 (L) 150 - 400 K/uL    Comment: CONSISTENT WITH PREVIOUS RESULT   Neutrophils Relative % 52 %   Lymphocytes Relative 17 %   Monocytes Relative 31 %   Eosinophils Relative 0 %   Basophils Relative 0 %   Neutro Abs 0.4 (L) 1.7 - 7.7 K/uL   Lymphs Abs 0.1 (L) 0.7 - 4.0 K/uL   Monocytes Absolute 0.2 0.1 - 1.0 K/uL   Eosinophils Absolute 0.0 0.0 - 0.7 K/uL   Basophils Absolute 0.0 0.0 - 0.1 K/uL   WBC Morphology MILD LEFT SHIFT (1-5% METAS, OCC MYELO, OCC BANDS)     Comment: Performed at [facility], [place] [address] [place], [place] [address]  Glucose, capillary     Status: Abnormal   Collection Time: 02/24/18  7:46 AM  Result Value Ref Range   Glucose-Capillary 313 (H) 65 - 99 mg/dL   Comment 1 Notify RN    Comment 2 Document in Chart    Glucose, random     Status: Abnormal   Collection Time: 02/24/18 12:22 PM  Result Value Ref Range  Glucose, Bld 391 (H) 65 - 99 mg/dL    Comment: Performed at [facility], [place] [address] [place], [place] [address]      Component Value Date/Time   SDES  02/21/2018 0321    BLOOD RIGHT ANTECUBITAL Performed at [facility], [place] [address], [place], [place] [address]    SPECREQUEST  02/21/2018 [phone]    BOTTLES DRAWN AEROBIC AND ANAEROBIC Blood Culture results may not be optimal due to an excessive volume of blood received in culture bottles Performed at [facility], [place] [address], [place], [place] [address]    CULT  02/21/2018 8916    NO GROWTH 3 DAYS Performed at [facility], [place] [address] [place], [place] [address]    REPTSTATUS PENDING 02/21/2018 8882   No results found. Recent Results (from the past 240 hour(s))  Blood Culture (routine x 2)     Status: None (Preliminary result)   Collection Time: 02/21/18  6:43 AM  Result Value Ref Range Status   Specimen Description   Final    BLOOD LEFT ANTECUBITAL Performed at [place] [address], [place], [place] [address]    Special Requests   Final    BOTTLES DRAWN AEROBIC AND ANAEROBIC Blood Culture adequate volume Performed at [place] [address] [place], [place] [address]    Culture   Final    NO GROWTH 3 DAYS Performed at [facility], [place] [address], [place], [place] [address]    Report Status PENDING  Incomplete  Blood Culture (routine x 2)     Status: None (Preliminary result)   Collection Time: 02/21/18  8:12 AM  Result Value Ref Range Status   Specimen Description   Final    BLOOD RIGHT ANTECUBITAL Performed at [place] [address], [place], [place] [address]    Special Requests   Final    BOTTLES DRAWN AEROBIC AND ANAEROBIC Blood Culture results may not be  optimal due to an excessive volume of blood received in culture bottles Performed at [place] [address] [place], [place] [address]    Culture   Final    NO GROWTH 3 DAYS Performed at [facility], [place] [address], [place], [place] [address]    Report Status PENDING  Incomplete    Microbiology: Recent Results (from the past 240 hour(s))  Blood Culture (routine x 2)     Status: None (Preliminary result)   Collection Time: 02/21/18  6:43 AM  Result Value Ref Range Status   Specimen Description   Final    BLOOD LEFT ANTECUBITAL Performed at [place] [address], [place], [place] [address]    Special Requests   Final    BOTTLES DRAWN AEROBIC AND ANAEROBIC Blood Culture adequate volume Performed at [place] [address] [place], [place] [address]    Culture   Final    NO GROWTH 3 DAYS Performed at [facility], [place] [address], [place], [place] [address]    Report Status PENDING  Incomplete  Blood Culture (routine x 2)     Status: None (Preliminary result)   Collection Time: 02/21/18  8:12 AM  Result Value Ref Range Status   Specimen Description   Final    BLOOD RIGHT ANTECUBITAL Performed at [place] [address] [place],  [address]    Special  Requests   Final    BOTTLES DRAWN AEROBIC AND ANAEROBIC Blood Culture results may not be optimal due to an excessive volume of blood received in culture bottles Performed at [place] [address] [place], [place] [address]    Culture   Final    NO GROWTH 3 DAYS Performed at [facility], [place] [address], [place], [place] [address]    Report Status PENDING  Incomplete    Radiographs and labs were personally reviewed by me.   Bobby Rumpf, MD [facility] for Infectious [place] [facility] [phone] 02/24/2018, 3:35 PM

## 2018-02-24 NOTE — Progress Notes (Signed)
PROGRESS NOTE  Edgar Herrera DJS:[id number] DOB: 1945-12-01 DOA: 02/21/2018 PCP: [facility]  HPI/Recap of past 24 hours: Edgar Herrera is a 72 y.o. male with medical history significant of paroxysmal atrial fibrillation, type 2 diabetes on oral hypoglycemics, hyperlipidemia, hypertension, recent diagnosis of squamous cell cancer of throat coming in with fever without a source.  Patient was seen approximately 1 week ago and received carboplatin/paclitaxel, week #2 on 02/11/2018.  Patient then developed low-grade temperature of 100.7 on 02/13/2018.  Patient was seen by cancer Center on 02/15/2018 and started on Bactrim twice daily.  Patient's fever subsided however yesterday and today patient began having high fevers to 102.  Patient was also noted to be fairly tired and lethargic.  Patient has chronic cough and sputum production that he was told was from his head and neck cancer.  He does not have any shortness of breath, nausea, vomiting, diarrhea.  He is also receiving radiation therapy and has had significant skin changes over his neck from this.  ED Course: In the ED patient was noted to have a temperature of 102.  Patient's heart rate was 104.  Other vital signs were reassuring.  Labs were notable for white blood cell count of 1.2 with approximately 900 neutrophils.  Hemoglobin was 10.3, hematocrit was 30.9.  Platelets were 114.  CMP showed mild hyponatremia at 132.  Creatinine was 1.22 and BUN was elevated at 39.  Glucose was 255.  Chest x-ray was unremarkable.  Patient was admitted for fever without a source in the setting of recent chemotherapy.  02/22/18: Patient seen and examined at his bedside.  He reports odynophagia.  Mouthwash/ oral care ordered.  Has a PEG tube in place.  PEG tube feeding has been restarted.  02/23/18: persistent odynophagia. IV fluconazole and Nystatin mouth care ordered for oropharyngeal candidiasis. No other complaints.  02/24/2018: Neutropenic fever.  Started on  neutropenic precautions.  Oncology consulted.  Infectious disease consulted.  Highly appreciated.   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  Highly appreciated Neutropenic precautions  Radiation dermatitis Continue IV vancomycin and IV cefepime Oncology consulted  Oropharyngeal candidiasis in the setting of recent chemotherapy  Continue nystatin every 6 hours Continue IV fluconazole Infectious disease consulted.  Highly appreciated  Head and neck squamous cell carcinoma  last chemotherapy 02/15/2018 Oncology consulted  Chronic cough Started pulmonary toilet Started hypersaline nebs Started DuoNeb every 6 hours and every 2 hours as needed Started liquid Mucinex per PEG tube  Paroxysmal A. fib, stable Rate controlled Continue Eliquis  Odynophagia secondary to oropharyngeal candidiasis, improving C/w mouthwash Nystatin IV fluconazole Has PEG tube in place Continue PEG tube feeding Continue free water flushes  Severe leukopenia WBC 0.7 from 1.0 Oncology consulted Repeat CBC in the morning Neutropenic precautions  Pancytopenia suspect secondary to chemotherapy Management per hematology/oncology  Hyponatremia, improving Repeat BMP in the morning Sodium 134 from  132  AKI, resolved Creatinine 0.7 on  02/24/18 Creatinine on presentation 1.22 Avoid nephrotoxic agents/hypotension/dehydration   Code Status: Full code  Family Communication: None at bedside  Disposition Plan: Home when clinically stable   Consultants:  Hematology/oncology  Procedures:  None  Antimicrobials:  Cefepime and IV vancomycin  IV fluconazole  DVT prophylaxis: Eliquis   Objective: Vitals:   02/24/18 0532 02/24/18 1026 02/24/18 1341 02/24/18 1343  BP: 117/71  (!) 102/54 (!) 114/49  Pulse: 100 98 (!) 108 (!) 51  Resp: 13 18 15    Temp: 100.3 F (37.9 C)  98.8 F (37.1 C)   TempSrc: Oral  Oral   SpO2: 97% 96% 99% 100%  Weight:      Height:        Intake/Output Summary (Last 24 hours) at 02/24/2018 1453 Last data filed at 02/24/2018 0559 Gross per 24 hour  Intake 1000 ml  Output -  Net 1000 ml   Filed Weights   02/21/18 0933 02/21/18 1249 02/22/18 0424  Weight: 89.4 kg (197 lb) 89.4 kg (197 lb) 88.7 kg (195 lb 9.6 oz)    Exam: 02/24/2018  . General: 72 y.o. year-old male well developed well-nourished in no acute distress.  Alert and oriented x3.   . Cardiovascular: Regular rate and rhythm with no rubs or gallops.  No JVD or thyromegaly noted.   Marland Kitchen Respiratory: Clear to auscultation with no wheezes or rales. Good inspiratory effort. . Abdomen: Soft nontender nondistended with normal bowel sounds x4 quadrants. . Musculoskeletal: No lower extremity edema. 2/4 pulses in all 4 extremities. . Skin: Non-blanchable rash in lower extremities bilaterally.  Radiation dermatitis on the neck and upper chest. . Psychiatry: Mood is appropriate for condition and setting   Data Reviewed: CBC: Recent Labs  Lab 02/21/18 0642 02/22/18 0358 02/23/18 0830 02/24/18 0500  WBC 1.2* 1.3* 1.0* 0.7*  NEUTROABS 0.9*  --  0.6* 0.4*  HGB 10.3* 9.9* 9.7* 8.8*  HCT 30.9* 29.8* 29.1* 26.1*  MCV 85.8 87.4 84.6 85.9  PLT 114* 105* 110* 161*   Basic Metabolic Panel: Recent Labs  Lab 02/21/18 0642  02/22/18 0358 02/23/18 0830 02/24/18 0500 02/24/18 1222  NA 132* 134*  --   --   --   K 5.3* 4.8  --   --   --   CL 97* 100*  --   --   --   CO2 26 23  --   --   --   GLUCOSE 255* 340*  --   --  391*  BUN 39* 27*  --   --   --   CREATININE 1.22 1.01 0.81 0.76  --   CALCIUM 8.4* 8.1*  --   --   --    GFR: Estimated Creatinine Clearance: 90.6 mL/min (by C-G formula based on SCr of 0.76 mg/dL). Liver Function Tests: Recent Labs  Lab 02/21/18 0642  AST 15  ALT 22  ALKPHOS 51  BILITOT 0.9  PROT 6.2*  ALBUMIN 2.7*   No results for input(s): LIPASE, AMYLASE in the last 168 hours. No results for input(s): AMMONIA in the last 168 hours. Coagulation Profile: No results for input(s): INR, PROTIME in the last 168 hours. Cardiac Enzymes: No results for input(s): CKTOTAL, CKMB, CKMBINDEX, TROPONINI in the last 168 hours. BNP (last 3 results) No results for input(s): PROBNP in the last 8760 hours. HbA1C: No results for input(s): HGBA1C in the last 72 hours. CBG: Recent Labs  Lab 02/23/18 0751 02/23/18 1208 02/23/18  1712 02/23/18 2152 02/24/18 0746  GLUCAP 203* 245* 298* 299* 313*   Lipid Profile: No results for input(s): CHOL, HDL, LDLCALC, TRIG, CHOLHDL, LDLDIRECT in the last 72 hours. Thyroid Function Tests: No results for input(s): TSH, T4TOTAL, FREET4, T3FREE, THYROIDAB in the last 72 hours. Anemia Panel: No results for input(s): VITAMINB12, FOLATE, FERRITIN, TIBC, IRON, RETICCTPCT in the last 72 hours. Urine analysis:    Component Value Date/Time   COLORURINE YELLOW 02/21/2018 0812   APPEARANCEUR CLEAR 02/21/2018 0812   LABSPEC 1.020 02/21/2018 0812   PHURINE 6.0 02/21/2018 0812   GLUCOSEU 150 (A) 02/21/2018 0812   HGBUR NEGATIVE 02/21/2018 0812   BILIRUBINUR NEGATIVE 02/21/2018 0812   KETONESUR NEGATIVE 02/21/2018 0812   PROTEINUR 30 (A) 02/21/2018 0812   NITRITE NEGATIVE 02/21/2018 0812   LEUKOCYTESUR NEGATIVE 02/21/2018 0812   Sepsis  Labs: @LABRCNTIP (procalcitonin:4,lacticidven:4)  ) Recent Results (from the past 240 hour(s))  Blood Culture (routine x 2)     Status: None (Preliminary result)   Collection Time: 02/21/18  6:43 AM  Result Value Ref Range Status   Specimen Description   Final    BLOOD LEFT ANTECUBITAL Performed at [facility], [place] [address], [place], [place] [address]    Special Requests   Final    BOTTLES DRAWN AEROBIC AND ANAEROBIC Blood Culture adequate volume Performed at [place] [address], [place], [place] [address]    Culture   Final    NO GROWTH 3 DAYS Performed at [facility], [place] [address], [place], [place] [address]    Report Status PENDING  Incomplete  Blood Culture (routine x 2)     Status: None (Preliminary result)   Collection Time: 02/21/18  8:12 AM  Result Value Ref Range Status   Specimen Description   Final    BLOOD RIGHT ANTECUBITAL Performed at [place] [address], [place], [place] [address]    Special Requests   Final    BOTTLES DRAWN AEROBIC AND ANAEROBIC Blood Culture results may not be optimal due to an excessive volume of blood received in culture bottles Performed at [place] [address] [place], [place] [address]    Culture   Final    NO GROWTH 3 DAYS Performed at [facility], [place] [address] [place], [place] [address]    Report Status PENDING  Incomplete      Studies: No results found.  Scheduled Meds: . allopurinol  100 mg Per Tube Daily  . amLODipine  10 mg Per Tube Daily  . apixaban  5 mg Per Tube BID  . docusate  100 mg Per Tube BID  . feeding supplement (JEVITY 1.2 CAL)  474 mL Per Tube TID WC & HS  . feeding supplement (PRO-STAT SUGAR FREE 64)  30 mL Per Tube BID  . guaiFENesin  15 mL Per Tube BID  . hydrochlorothiazide  12.5 mg Per Tube Daily  . insulin aspart  0-20 Units Subcutaneous TID WC  . insulin aspart  0-5 Units  Subcutaneous QHS  . insulin aspart  0-9 Units Subcutaneous TID WC  . ipratropium-albuterol  3 mL Nebulization BID  . magic mouthwash w/lidocaine  1 mL Oral TID  . nystatin  5 mL Oral QID  . pravastatin  40 mg Per Tube Daily  . sennosides  5 mL Per Tube BID  . sodium chloride HYPERTONIC  4 mL Nebulization Daily  . Tbo-filgastrim (GRANIX) SQ  480  mcg Subcutaneous Once  . vitamin B-12  500 mcg Per Tube BID    Continuous Infusions: . ceFEPime (MAXIPIME) IV Stopped (02/24/18 1017)  . fluconazole (DIFLUCAN) IV Stopped (02/23/18 2122)  . vancomycin Stopped (02/24/18 0000)     LOS: 3 days     Kayleen Memos, MD [facility] Pager [phone]  If 7PM-7AM, please contact night-coverage [email or web address] Password [facility] 02/24/2018, 2:53 PM

## 2018-02-24 NOTE — Assessment & Plan Note (Signed)
72 y.o.  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  Agree with the current clinical stage II based on clinical T3, clinical N1 disease.  Based on the age and comorbidities, patient is not a candidate for cisplatin chemotherapy, but will likely be able to tolerate concurrent weekly carboplatin/paclitaxel combination administered with radiotherapy.  Patient presents to the clinic to continue systemic therapy therapy.  Appears to be tolerating infusion well so far.  Mild lightheadedness and dizziness in the morning likely due to hypovolemia considering a 4 pound weight loss over the past week.  Clinical evaluation lab work-up permissive to proceed with the next cycle of systemic chemotherapy.  Plan: -Proceed with systemic therapy with carboplatin/paclitaxel, week #3. -We will replace potassium due to hypokalemia. -Return to clinic in 1 week: Labs, possible next dose of systemic chemotherapy.

## 2018-02-25 ENCOUNTER — Ambulatory Visit: Payer: No Typology Code available for payment source

## 2018-02-25 ENCOUNTER — Inpatient Hospital Stay (HOSPITAL_COMMUNITY): Payer: Medicare Other

## 2018-02-25 DIAGNOSIS — R4702 Dysphasia: Secondary | ICD-10-CM

## 2018-02-25 DIAGNOSIS — L309 Dermatitis, unspecified: Secondary | ICD-10-CM

## 2018-02-25 DIAGNOSIS — B379 Candidiasis, unspecified: Secondary | ICD-10-CM

## 2018-02-25 DIAGNOSIS — L539 Erythematous condition, unspecified: Secondary | ICD-10-CM

## 2018-02-25 DIAGNOSIS — C760 Malignant neoplasm of head, face and neck: Secondary | ICD-10-CM

## 2018-02-25 LAB — CBC WITH DIFFERENTIAL/PLATELET
Basophils Absolute: 0 10*3/uL (ref 0.0–0.1)
Basophils Relative: 0 %
EOS PCT: 0 %
Eosinophils Absolute: 0 10*3/uL (ref 0.0–0.7)
HEMATOCRIT: 25.9 % — AB (ref 39.0–52.0)
Hemoglobin: 9 g/dL — ABNORMAL LOW (ref 13.0–17.0)
LYMPHS ABS: 0.2 10*3/uL — AB (ref 0.7–4.0)
Lymphocytes Relative: 12 %
MCH: 29.8 pg (ref 26.0–34.0)
MCHC: 34.7 g/dL (ref 30.0–36.0)
MCV: 85.8 fL (ref 78.0–100.0)
MONO ABS: 0.3 10*3/uL (ref 0.1–1.0)
Monocytes Relative: 20 %
NEUTROS ABS: 0.8 10*3/uL — AB (ref 1.7–7.7)
NEUTROS PCT: 68 %
PLATELETS: 108 10*3/uL — AB (ref 150–400)
RBC: 3.02 MIL/uL — ABNORMAL LOW (ref 4.22–5.81)
RDW: 14.8 % (ref 11.5–15.5)
WBC: 1.3 10*3/uL — CL (ref 4.0–10.5)
nRBC: 2 /100 WBC — ABNORMAL HIGH

## 2018-02-25 LAB — GLUCOSE, CAPILLARY
Glucose-Capillary: 266 mg/dL — ABNORMAL HIGH (ref 65–99)
Glucose-Capillary: 400 mg/dL — ABNORMAL HIGH (ref 65–99)
Glucose-Capillary: 236 mg/dL — ABNORMAL HIGH (ref 65–99)
Glucose-Capillary: 361 mg/dL — ABNORMAL HIGH (ref 65–99)

## 2018-02-25 LAB — COMPREHENSIVE METABOLIC PANEL
ALT: 96 U/L — ABNORMAL HIGH (ref 17–63)
ANION GAP: 11 (ref 5–15)
AST: 46 U/L — ABNORMAL HIGH (ref 15–41)
Albumin: 2.4 g/dL — ABNORMAL LOW (ref 3.5–5.0)
Alkaline Phosphatase: 59 U/L (ref 38–126)
BILIRUBIN TOTAL: 0.5 mg/dL (ref 0.3–1.2)
BUN: 24 mg/dL — ABNORMAL HIGH (ref 6–20)
CHLORIDE: 97 mmol/L — AB (ref 101–111)
CO2: 27 mmol/L (ref 22–32)
Calcium: 8.1 mg/dL — ABNORMAL LOW (ref 8.9–10.3)
Creatinine, Ser: 0.71 mg/dL (ref 0.61–1.24)
GFR calc Af Amer: >60 mL/min (ref >60)
Glucose, Bld: 254 mg/dL — ABNORMAL HIGH (ref 65–99)
POTASSIUM: 3.4 mmol/L — AB (ref 3.5–5.1)
Sodium: 135 mmol/L (ref 135–145)
TOTAL PROTEIN: 6 g/dL — AB (ref 6.5–8.1)

## 2018-02-25 MED ORDER — POTASSIUM CHLORIDE 20 MEQ/15ML (10%) PO SOLN
40.0000 meq | Freq: Two times a day (BID) | ORAL | Status: AC
  Administered 2018-02-25 (×2): 40 meq
  Filled 2018-02-25 (×2): qty 30

## 2018-02-25 MED ORDER — IOHEXOL 300 MG/ML  SOLN
75.0000 mL | Freq: Once | INTRAMUSCULAR | Status: AC | PRN
  Administered 2018-02-25: 75 mL via INTRAVENOUS

## 2018-02-25 MED ORDER — PIPERACILLIN-TAZOBACTAM 3.375 G IVPB
3.3750 g | Freq: Three times a day (TID) | INTRAVENOUS | Status: DC
  Administered 2018-02-25 – 2018-03-01 (×12): 3.375 g via INTRAVENOUS
  Filled 2018-02-25 (×13): qty 50

## 2018-02-25 NOTE — Progress Notes (Signed)
Subjective: No new complaints   Antibiotics:  Anti-infectives (From admission, onward)   Start     Dose/Rate Route Frequency Ordered Stop   02/23/18 1700  fluconazole (DIFLUCAN) IVPB 400 mg    Note to Pharmacy:  For oropharyngeal candidiasis   400 mg 100 mL/hr over 120 Minutes Intravenous Every 24 hours 02/23/18 1538 02/26/18 1659   02/23/18 1545  fluconazole (DIFLUCAN) IVPB 400 mg  Status:  Discontinued     400 mg 100 mL/hr over 120 Minutes Intravenous Every 24 hours 02/23/18 1537 02/23/18 1538   02/22/18 2200  vancomycin (VANCOCIN) 1,500 mg in sodium chloride 0.9 % 500 mL IVPB     1,500 mg 250 mL/hr over 120 Minutes Intravenous Every 24 hours 02/22/18 0948     02/21/18 2200  vancomycin (VANCOCIN) 1,250 mg in sodium chloride 0.9 % 250 mL IVPB  Status:  Discontinued     1,250 mg 166.7 mL/hr over 90 Minutes Intravenous Every 24 hours 02/21/18 0934 02/22/18 0948   02/21/18 1700  ceFEPIme (MAXIPIME) 2 g in sodium chloride 0.9 % 100 mL IVPB     2 g 200 mL/hr over 30 Minutes Intravenous Every 8 hours 02/21/18 1013     02/21/18 1400  ceFEPIme (MAXIPIME) 2 g in sodium chloride 0.9 % 100 mL IVPB  Status:  Discontinued     2 g 200 mL/hr over 30 Minutes Intravenous Every 8 hours 02/21/18 1012 02/21/18 1013   02/21/18 0800  ceFEPIme (MAXIPIME) 2 g in sodium chloride 0.9 % 100 mL IVPB     2 g 200 mL/hr over 30 Minutes Intravenous  Once 02/21/18 0752 02/21/18 0956   02/21/18 0800  vancomycin (VANCOCIN) IVPB 1000 mg/200 mL premix     1,000 mg 200 mL/hr over 60 Minutes Intravenous  Once 02/21/18 0758 02/21/18 1450      Medications: Scheduled Meds: . allopurinol  100 mg Per Tube Daily  . amLODipine  10 mg Per Tube Daily  . apixaban  5 mg Per Tube BID  . feeding supplement (JEVITY 1.2 CAL)  474 mL Per Tube TID WC & HS  . feeding supplement (PRO-STAT SUGAR FREE 64)  30 mL Per Tube BID  . guaiFENesin  15 mL Per Tube BID  . hydrochlorothiazide  12.5 mg Per Tube Daily  . insulin  aspart  0-20 Units Subcutaneous TID WC  . insulin aspart  0-5 Units Subcutaneous QHS  . ipratropium-albuterol  3 mL Nebulization BID  . magic mouthwash w/lidocaine  1 mL Oral TID  . potassium chloride  40 mEq Per Tube BID  . pravastatin  40 mg Per Tube Daily  . sodium chloride HYPERTONIC  4 mL Nebulization Daily  . vitamin B-12  500 mcg Per Tube BID   Continuous Infusions: . ceFEPime (MAXIPIME) IV Stopped (02/25/18 0941)  . fluconazole (DIFLUCAN) IV Stopped (02/24/18 1952)  . vancomycin Stopped (02/24/18 2347)   PRN Meds:.acetaminophen (TYLENOL) oral liquid 160 mg/5 mL **OR** acetaminophen, albuterol, guaiFENesin, HYDROcodone-acetaminophen, LORazepam, morphine injection, ondansetron **OR** ondansetron (ZOFRAN) IV, polyethylene glycol, polyvinyl alcohol    Objective: Weight change:   Intake/Output Summary (Last 24 hours) at 02/25/2018 1437 Last data filed at 02/25/2018 0600 Gross per 24 hour  Intake 1474 ml  Output -  Net 1474 ml   Blood pressure 127/69, pulse 86, temperature 98.6 F (37 C), temperature source Oral, resp. rate 18, height 5\' 9"  (1.753 m), weight 195 lb 9.6 oz (88.7 kg), SpO2 96 %. Temp:  [  98.6 F (37 C)-101.7 F (38.7 C)] 98.6 F (37 C) (05/20 0603) Pulse Rate:  [85-86] 86 (05/20 0603) Resp:  [18] 18 (05/20 0603) BP: (127-130)/(69-74) 127/69 (05/20 0603) SpO2:  [96 %-99 %] 96 % (05/20 0749)  Physical Exam: General: Alert and awake, oriented x3, not in any acute distress. HEENT: anicteric sclera, EOMI skin of neck see below  CVS regular rate, normal r,  no murmur rubs or gallops Chest: clear to auscultation bilaterally, no wheezing, rales or rhonchi Abdomen: soft nontender, nondistended, normal bowel sounds, Extremities: no  clubbing or edema noted bilaterally  Neuro: nonfocal  Skin around neck erythematous changes and exfoliation Feb 25, 2018:     Tongue with some raised material        CBC: CBC Latest Ref Rng & Units 02/25/2018 02/24/2018  02/23/2018  WBC 4.0 - 10.5 K/uL 1.3(LL) 0.7(LL) 1.0(LL)  Hemoglobin 13.0 - 17.0 g/dL 9.0(L) 8.8(L) 9.7(L)  Hematocrit 39.0 - 52.0 % 25.9(L) 26.1(L) 29.1(L)  Platelets 150 - 400 K/uL 108(L) 102(L) 110(L)     BMET Recent Labs    02/24/18 0500 02/24/18 1222 02/25/18 0444  NA  --   --  135  K  --   --  3.4*  CL  --   --  97*  CO2  --   --  27  GLUCOSE  --  391* 254*  BUN  --   --  24*  CREATININE 0.76  --  0.71  CALCIUM  --   --  8.1*     Liver Panel  Recent Labs    02/25/18 0444  PROT 6.0*  ALBUMIN 2.4*  AST 46*  ALT 96*  ALKPHOS 59  BILITOT 0.5       Sedimentation Rate No results for input(s): ESRSEDRATE in the last 72 hours. C-Reactive Protein No results for input(s): CRP in the last 72 hours.  Micro Results: Recent Results (from the past 720 hour(s))  Urine culture     Status: Abnormal   Collection Time: 02/05/18  2:34 AM  Result Value Ref Range Status   Specimen Description   Final    URINE, RANDOM Performed at [facility] [address], [place], [place] [address]    Special Requests   Final    NONE Performed at [facility], [place] [address] [place], [place] [address]    Culture (A)  Final    <10,000 COLONIES/mL INSIGNIFICANT GROWTH Performed at [facility] [address] [place], [place] [address]    Report Status 02/06/2018 FINAL  Final  Blood culture (routine x 2)     Status: None   Collection Time: 02/05/18  3:59 AM  Result Value Ref Range Status   Specimen Description   Final    BLOOD RIGHT ANTECUBITAL Performed at [facility] [address], [place], [place] [address]    Special Requests   Final    BOTTLES DRAWN AEROBIC AND ANAEROBIC Blood Culture results may not be optimal due to an excessive volume of blood received in culture bottles Performed at [place] [address], [place], [place] [address]    Culture   Final    NO GROWTH 5  DAYS Performed at [facility], [place] [address], [place], [place] [address]    Report Status 02/10/2018 FINAL  Final  Blood Culture (routine x 2)     Status: None (Preliminary result)   Collection Time: 02/21/18  6:43 AM  Result Value Ref Range Status   Specimen Description   Final    BLOOD LEFT ANTECUBITAL Performed at [facility] [place] [address] [place], [place] [address]    Special Requests   Final    BOTTLES DRAWN AEROBIC AND ANAEROBIC Blood Culture adequate volume Performed at [facility] [address], [place], [place] [address]    Culture   Final    NO GROWTH 3 DAYS Performed at [facility], [place] [address], [place], [place] [address]    Report Status PENDING  Incomplete  Blood Culture (routine x 2)     Status: None (Preliminary result)   Collection Time: 02/21/18  8:12 AM  Result Value Ref Range Status   Specimen Description   Final    BLOOD RIGHT ANTECUBITAL Performed at [facility] [address] [place], [place] [address]    Special Requests   Final    BOTTLES DRAWN AEROBIC AND ANAEROBIC Blood Culture results may not be optimal due to an excessive volume of blood received in culture bottles Performed at [facility] [address], [place], [place] [address]    Culture   Final    NO GROWTH 3 DAYS Performed at [facility], [place] [address] [place], [place] [address]    Report Status PENDING  Incomplete    Studies/Results: No results found.    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 male with  72 y.o. male  with hx of SCCa of throat. He last received CTX (carboplatin/paclitaxcel 5-6), his second cycle. Prior to this he had low grade temps (per his wife to 100.4). He was given amoxil fo rthis.  He then developed higher temps at home that have persisted despite starting bactrim on 5-10. He came to ED 5-16 where he had temp 102, and and ANC of 900. He was also noted to have increased Cr (1.22 from 0.7). His CXR was (-).  He was noted to have erythema over his neck, most likely felt to be from his XRT.  He was started on vanco/cefepime. On 5-18 he had fluconazole added for thrush.  He was seen by H/O today and started on granix (GCSF).   #1 Neutropenic fevers: His fevers temporarily seem to have defervesced.  His white count is climbing.  The only localizing symptom he has is the erythema on his face.  He does have dysphasia as well.  Consider getting a CT scan with contrast to evaluate the neck since he is having dysphasia for any kinds of abnormalities.  We can switch to zosyn for anerobic coverag but care with increased nephrotoxicity with this and vancomycin       LOS: 4 days   Alcide Evener 02/25/2018, 2:37 PM

## 2018-02-25 NOTE — Progress Notes (Addendum)
PROGRESS NOTE  Edgar Herrera WER:[id number] DOB: 1946-02-22 DOA: 02/21/2018 PCP: [facility]  HPI/Recap of past 24 hours: Edgar Herrera is a 72 y.o. male with medical history significant of paroxysmal atrial fibrillation, type 2 diabetes on oral hypoglycemics, hyperlipidemia, hypertension, recent diagnosis of squamous cell cancer of throat coming in with fever without a source.  Patient was seen approximately 1 week ago and received carboplatin/paclitaxel, week #2 on 02/11/2018.  Patient then developed low-grade temperature of 100.7 on 02/13/2018.  Patient was seen by cancer Center on 02/15/2018 and started on Bactrim twice daily.  Patient's fever subsided however yesterday and today patient began having high fevers to 102.  Patient was also noted to be fairly tired and lethargic.  Patient has chronic cough and sputum production that he was told was from his head and neck cancer.  He does not have any shortness of breath, nausea, vomiting, diarrhea.  He is also receiving radiation therapy and has had significant skin changes over his neck from this.  ED Course: In the ED patient was noted to have a temperature of 102.  Patient's heart rate was 104.  Other vital signs were reassuring.  Labs were notable for white blood cell count of 1.2 with approximately 900 neutrophils.  Hemoglobin was 10.3, hematocrit was 30.9.  Platelets were 114.  CMP showed mild hyponatremia at 132.  Creatinine was 1.22 and BUN was elevated at 39.  Glucose was 255.  Chest x-ray was unremarkable.  Patient was admitted for fever without a source in the setting of recent chemotherapy.  02/22/18: Patient seen and examined at his bedside.  He reports odynophagia.  Mouthwash/ oral care ordered.  Has a PEG tube in place.  PEG tube feeding has been restarted.  02/23/18: persistent odynophagia. IV fluconazole and Nystatin mouth care ordered for oropharyngeal candidiasis. No other complaints.  02/24/2018: Neutropenic fever.  Started on  neutropenic precautions.  Oncology consulted.  Infectious disease consulted.  Highly appreciated.  02/25/18: febrile overnight w Tmax 100.5. Wbc improving from 0.74 to 1.3. No new complaints.   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 Highly appreciated Hemonc consulted. Highly appreciated On G-CSF until neutrophil count is >2000 for 2 days.  Radiation dermatitis Continue IV vancomycin and IV cefepime Oncology consulted  Oropharyngeal candidiasis in the setting of recent chemotherapy  Continue IV fluconazole Infectious disease consulted.  Highly appreciated  Head and neck squamous cell carcinoma  last chemotherapy 02/15/2018 Oncology consulted  Cough, improving c/w pulmonary toilet c/w hypersaline nebs c/w DuoNeb every 6 hours and every 2 hours as needed c/w liquid Mucinex per PEG tube  P. A-fib, stable Rate controlled Continue Eliquis  Odynophagia secondary to oropharyngeal candidiasis, improving C/w mouthwash C/w IV fluconazole Has PEG tube in place Continue PEG tube feeding Continue free water flushes  Leukopenia, improving Most likely 2/2 to  chemo Wbc 1.3 from 0.7 Oncology following Repeat CBC in the morning Neutropenic  precautions  Pancytopenia suspect secondary to chemotherapy Management per hematology/oncology Repeat CBC in the am  Euvolemic hyponatremia, resolved Repeat BMP in the morning  AKI, resolved Creatinine 0.7 on 02/24/18 Creatinine on presentation 1.22 Avoid nephrotoxic agents/hypotension/dehydration  Hypokalemia K+ 3.4 repleted with liquid Kcl supplement and 2gm IV magnesium Repeat BMP and mg2+level in the am   Code Status: Full code  Family Communication: None at bedside  Disposition Plan: Home when clinically stable   Consultants:  Hematology/oncology  ID  Procedures:  None  Antimicrobials:  Cefepime and IV vancomycin  IV fluconazole  DVT prophylaxis: Eliquis   Objective: Vitals:   02/25/18 0135 02/25/18 0603 02/25/18 0749 02/25/18 1447  BP:  127/69  129/63  Pulse:  86  (!) 105  Resp:  18  14  Temp: 98.8 F (37.1 C) 98.6 F (37 C)  98.8 F (37.1 C)  TempSrc: Oral Oral  Oral  SpO2:  97% 96% 97%  Weight:      Height:        Intake/Output Summary (Last 24 hours) at 02/25/2018 1629 Last data filed at 02/25/2018 0600 Gross per 24 hour  Intake 1474 ml  Output -  Net 1474 ml   Filed Weights   02/21/18 0933 02/21/18 1249 02/22/18 0424  Weight: 89.4 kg (197 lb) 89.4 kg (197 lb) 88.7 kg (195 lb 9.6 oz)    Exam: 02/25/18  . General: 72 y.o. year-old male WD wN NAD A&O x 3 . Cardiovascular: RRR no rubs or gallops. No JVD or thyromegaly.  Marland Kitchen Respiratory: Clear to auscultation with no wheezes or rales. Good inspiratory effort. . Abdomen: Soft nontender nondistended with normal bowel sounds x4 quadrants. . Musculoskeletal: No lower extremity edema. 2/4 pulses in all 4 extremities. . Skin: Non-blanchable rash in lower extremities bilaterally.  Radiation dermatitis on the neck and upper chest. . Psychiatry: Mood is appropriate for condition and setting   Data Reviewed: CBC: Recent Labs  Lab 02/21/18 0642 02/22/18 0358 02/23/18 0830 02/24/18 0500  02/25/18 0444  WBC 1.2* 1.3* 1.0* 0.7* 1.3*  NEUTROABS 0.9*  --  0.6* 0.4* 0.8*  HGB 10.3* 9.9* 9.7* 8.8* 9.0*  HCT 30.9* 29.8* 29.1* 26.1* 25.9*  MCV 85.8 87.4 84.6 85.9 85.8  PLT 114* 105* 110* 102* 102*   Basic Metabolic Panel: Recent Labs  Lab 02/21/18 0642 02/22/18 0358 02/23/18 0830 02/24/18 0500 02/24/18 1222 02/25/18 0444  NA 132* 134*  --   --   --  135  K 5.3* 4.8  --   --   --  3.4*  CL 97* 100*  --   --   --  97*  CO2 26 23  --   --   --  27  GLUCOSE 255* 340*  --   --  391* 254*  BUN 39* 27*  --   --   --  24*  CREATININE 1.22 1.01 0.81 0.76  --  0.71  CALCIUM 8.4* 8.1*  --   --   --  8.1*   GFR: Estimated Creatinine Clearance: 90.6 mL/min (by C-G formula based on SCr of 0.71 mg/dL). Liver Function Tests: Recent Labs  Lab 02/21/18 0642 02/25/18 0444  AST 15 46*  ALT 22 96*  ALKPHOS 51 59  BILITOT 0.9 0.5  PROT 6.2* 6.0*  ALBUMIN 2.7* 2.4*   No results for input(s): LIPASE, AMYLASE in the last 168 hours. No results for input(s):  AMMONIA in the last 168 hours. Coagulation Profile: No results for input(s): INR, PROTIME in the last 168 hours. Cardiac Enzymes: No results for input(s): CKTOTAL, CKMB, CKMBINDEX, TROPONINI in the last 168 hours. BNP (last 3 results) No results for input(s): PROBNP in the last 8760 hours. HbA1C: Recent Labs    02/24/18 0500  HGBA1C 7.7*   CBG: Recent Labs  Lab 02/23/18 2152 02/24/18 0746 02/24/18 2121 02/25/18 0752 02/25/18 1246  GLUCAP 299* 313* 282* 266* 400*   Lipid Profile: No results for input(s): CHOL, HDL, LDLCALC, TRIG, CHOLHDL, LDLDIRECT in the last 72 hours. Thyroid Function Tests: No results for input(s): TSH, T4TOTAL, FREET4, T3FREE, THYROIDAB in the last 72 hours. Anemia Panel: No results for input(s): VITAMINB12, FOLATE, FERRITIN, TIBC, IRON, RETICCTPCT in the last 72 hours. Urine analysis:    Component Value Date/Time   COLORURINE YELLOW 02/21/2018 0812   APPEARANCEUR CLEAR 02/21/2018 0812    LABSPEC 1.020 02/21/2018 0812   PHURINE 6.0 02/21/2018 0812   GLUCOSEU 150 (A) 02/21/2018 0812   HGBUR NEGATIVE 02/21/2018 0812   BILIRUBINUR NEGATIVE 02/21/2018 0812   KETONESUR NEGATIVE 02/21/2018 0812   PROTEINUR 30 (A) 02/21/2018 0812   NITRITE NEGATIVE 02/21/2018 0812   LEUKOCYTESUR NEGATIVE 02/21/2018 0812   Sepsis Labs: @LABRCNTIP (procalcitonin:4,lacticidven:4)  ) Recent Results (from the past 240 hour(s))  Blood Culture (routine x 2)     Status: None (Preliminary result)   Collection Time: 02/21/18  6:43 AM  Result Value Ref Range Status   Specimen Description   Final    BLOOD LEFT ANTECUBITAL Performed at [facility], [place] [address] [place], [place] [address]    Special Requests   Final    BOTTLES DRAWN AEROBIC AND ANAEROBIC Blood Culture adequate volume Performed at [facility] [address] [place], [place] [address]    Culture   Final    NO GROWTH 4 DAYS Performed at [facility], [place] [address], [place], [place] [address]    Report Status PENDING  Incomplete  Blood Culture (routine x 2)     Status: None (Preliminary result)   Collection Time: 02/21/18  8:12 AM  Result Value Ref Range Status   Specimen Description   Final    BLOOD RIGHT ANTECUBITAL Performed at [facility] [address] [place], [place] [address]    Special Requests   Final    BOTTLES DRAWN AEROBIC AND ANAEROBIC Blood Culture results may not be optimal due to an excessive volume of blood received in culture bottles Performed at [facility] [address], [place], [place] [address]    Culture   Final    NO GROWTH 4 DAYS Performed at [facility], [place] [address] [place], [place] [address]    Report Status PENDING  Incomplete  Culture, blood (x 2)     Status: None (Preliminary result)   Collection Time: 02/24/18 10:05 AM  Result Value Ref Range Status   Specimen Description   Final     BLOOD RIGHT ANTECUBITAL Performed at [facility] [address] [place], [place] [address]    Special Requests   Final    BOTTLES DRAWN AEROBIC AND ANAEROBIC Blood Culture adequate volume Performed at [facility] [address], [place], [place] [address]    Culture   Final    NO GROWTH < 24 HOURS Performed at [facility] [address] [place],  [address]  Report Status PENDING  Incomplete  Culture, blood (x 2)     Status: None (Preliminary result)   Collection Time: 02/24/18 10:06 AM  Result Value Ref Range Status   Specimen Description   Final    BLOOD LEFT ANTECUBITAL Performed at [facility] [address], [place], [place] [address]    Special Requests   Final    BOTTLES DRAWN AEROBIC ONLY Blood Culture adequate volume Performed at [facility] [address] [place], [place] [address]    Culture   Final    NO GROWTH < 24 HOURS Performed at [facility] [address] [place], [place] [address]    Report Status PENDING  Incomplete      Studies: No results found.  Scheduled Meds: . allopurinol  100 mg Per Tube Daily  . amLODipine  10 mg Per Tube Daily  . apixaban  5 mg Per Tube BID  . feeding supplement (JEVITY 1.2 CAL)  474 mL Per Tube TID WC & HS  . feeding supplement (PRO-STAT SUGAR FREE 64)  30 mL Per Tube BID  . guaiFENesin  15 mL Per Tube BID  . hydrochlorothiazide  12.5 mg Per Tube Daily  . insulin aspart  0-20 Units Subcutaneous TID WC  . insulin aspart  0-5 Units Subcutaneous QHS  . ipratropium-albuterol  3 mL Nebulization BID  . magic mouthwash w/lidocaine  1 mL Oral TID  . potassium chloride  40 mEq Per Tube BID  . pravastatin  40 mg Per Tube Daily  . sodium chloride HYPERTONIC  4 mL Nebulization Daily  . vitamin B-12  500 mcg Per Tube BID    Continuous Infusions: . fluconazole (DIFLUCAN) IV Stopped (02/24/18 1952)  .  piperacillin-tazobactam (ZOSYN)  IV 3.375 g (02/25/18 1613)  . vancomycin Stopped (02/24/18 2347)     LOS: 4 days     Kayleen Memos, MD [facility] Pager [phone]  If 7PM-7AM, please contact night-coverage [email or web address] Password [facility] 02/25/2018, 4:29 PM

## 2018-02-25 NOTE — Progress Notes (Signed)
Pharmacy Antibiotic Note  Edgar Herrera is a 72 y.o. male with tongue cancer currently undergoing XRT + chemotherapy presented to the ED on 02/21/2018 with c/o fever and weakness.  Pharmacy consulted to dose/monitor vancomycin. Patient is also receiving cefepime and fluconazole. ID following.  Today, 02/25/2018: - Tmax 101.7, wbc 1.3 - SCr 0.7 is stable   Plan: - Continue Vancomycin 1500 mg IV q24h for est AUC 442 - Change Cefepime to Zosyn 3.375gm IV Q8h to be infused over 4hrs for anaerobic   coverage - Monitor renal function- daily Scr - Continue current antibiotics per ID note recommendations ______________________________________  Height: 5\' 9"  (175.3 cm) Weight: 195 lb 9.6 oz (88.7 kg) IBW/kg (Calculated) : 70.7  Temp (24hrs), Avg:99.7 F (37.6 C), Min:98.6 F (37 C), Max:101.7 F (38.7 C)  Recent Labs  Lab 02/21/18 0642 02/21/18 0650 02/22/18 0358 02/23/18 0830 02/24/18 0500 02/25/18 0444  WBC 1.2*  --  1.3* 1.0* 0.7* 1.3*  CREATININE 1.22  --  1.01 0.81 0.76 0.71  LATICACIDVEN  --  1.37  --   --   --   --     Estimated Creatinine Clearance: 90.6 mL/min (by C-G formula based on SCr of 0.71 mg/dL).    No Known Allergies   Thank you for allowing pharmacy to be a part of this patient's care.  Netta Cedars, PharmD, BCPS Pager: [phone] 02/25/18 11:08 AM

## 2018-02-25 NOTE — Care Management Important Message (Signed)
Important Message  Patient Details  Name: Edgar Herrera MRN: [id number] Date of Birth: 02/29/1946   Medicare Important Message Given:  Yes    Kerin Salen 02/25/2018, 12:02 [place] Message  Patient Details  Name: Edgar Herrera MRN: [id number] Date of Birth: 11-10-1945   Medicare Important Message Given:  Yes    Kerin Salen 02/25/2018, 12:02 PM

## 2018-02-25 NOTE — Consult Note (Signed)
IP PROGRESS NOTE  Subjective:  Over the weekend, patient continued to spike fevers despite broad-spectrum antibiotic coverage.  She is to have a very sore throat and painful neck.  Has received his radiation therapy on Thursday and Friday.  Over the weekend, was started on G-CSF due to persistent febrile illness in the setting of neutropenia.   Objective: Vital signs in last 24 hours: Blood pressure 127/69, pulse 86, temperature 98.6 F (37 C), temperature source Oral, resp. rate 18, height 5\' 9"  (1.753 m), weight 195 lb 9.6 oz (88.7 kg), SpO2 96 %.  Intake/Output from previous day: 05/19 0701 - 05/20 0700 In: 1474 [NG/GT:474; IV Piggyback:1000] Out: -   Physical Exam: Alert, awake, oriented x3. HEENT: Radiation dermatitis without palpable fluctuance in the soft tissues Lungs: Clear to auscultation bilaterally. Cardiac: S1/S2, regular.  No murmurs, rubs, gallops. Abdomen: Soft, nontender, nondistended.   Extremities: No lower extremity edema Portacath: without erythema  Lab Results: Recent Labs    02/24/18 0500 02/25/18 0444  WBC 0.7* 1.3*  HGB 8.8* 9.0*  HCT 26.1* 25.9*  PLT 102* 108*    BMET Recent Labs    02/24/18 0500 02/24/18 1222 02/25/18 0444  NA  --   --  135  K  --   --  3.4*  CL  --   --  97*  CO2  --   --  27  GLUCOSE  --  391* 254*  BUN  --   --  24*  CREATININE 0.76  --  0.71  CALCIUM  --   --  8.1*    No results found for: CEA1  Studies/Results: No results found.  Medications: I have reviewed the patient's current medications.  Assessment/Plan: 72 y.o. Male undergoing radiation therapy with concurrent systemic treatment with carboplatin and paclitaxel for diagnosis of squamous cell carcinoma of head and neck.  Admitted for neutropenic fever with ANC of 0.9 and temperature 102.  Recurrent  fevers despite adequate antibiotic coverage for greater than 72 hours including fluconazole which was started on 02/23/2018.  Started on G-CSF and white  blood cell count is responding.  Recommendations: -Agree with the current choice of antibiotics, but consider changing cefepime to piperacillin/tazobactam or meropenem for improved anaerobic coverage. -We will not receive any further systemic chemotherapy  -Continue G-CSF until neutrophil count is above 2000 for 2 consecutive days or above 5000 on any day. - Recommend resuming radiation therapy tomorrow at the discretion of Dr. Isidore Moos.  Overall, the concern regarding G-CSF and radiation has to do with radiation impact on actively proliferating Carbin marrow after G-CSF stimulation.  In this situation, small percentage of the [place] marrow containing skeletal structures is being exposed to radiation and likely impact is minimal in my opinion thus I would continue G-CSF despite radiation reinitiation.    LOS: 4 days   Ardath Sax, MD   02/25/2018, 9:59 AM

## 2018-02-25 NOTE — Progress Notes (Signed)
Pharmacy Antibiotic Note  Edgar Herrera is a 72 y.o. male with tongue cancer currently undergoing XRT + chemotherapy presented to the ED on 02/21/2018 with c/o fever and weakness.  Pharmacy consulted to dose/monitor vancomycin. Patient is also receiving cefepime and fluconazole. ID following.  Today, 02/25/2018: - Tmax 101.7, wbc 1.3 - SCr 0.7 is stable   Plan: - Continue Vancomycin 1500 mg IV q24h for est AUC 442 - continue cefepime 2 g IV q8h for febrile neutropenia - monitor renal function - Continue current antibiotics per ID note recommendations ______________________________________  Height: 5\' 9"  (175.3 cm) Weight: 195 lb 9.6 oz (88.7 kg) IBW/kg (Calculated) : 70.7  Temp (24hrs), Avg:99.7 F (37.6 C), Min:98.6 F (37 C), Max:101.7 F (38.7 C)  Recent Labs  Lab 02/21/18 0642 02/21/18 0650 02/22/18 0358 02/23/18 0830 02/24/18 0500 02/25/18 0444  WBC 1.2*  --  1.3* 1.0* 0.7* 1.3*  CREATININE 1.22  --  1.01 0.81 0.76 0.71  LATICACIDVEN  --  1.37  --   --   --   --     Estimated Creatinine Clearance: 90.6 mL/min (by C-G formula based on SCr of 0.71 mg/dL).    No Known Allergies   Thank you for allowing pharmacy to be a part of this patient's care.  Theodis Shove, PharmD, BCPS Clinical Pharmacist Pager: [phone] 02/25/18 11:08 AM

## 2018-02-25 NOTE — Progress Notes (Addendum)
Inpatient Diabetes Program Recommendations  AACE/ADA: New Consensus Statement on Inpatient Glycemic Control (2015)  Target Ranges:  Prepandial:   less than 140 mg/dL      Peak postprandial:   less than 180 mg/dL (1-2 hours)      Critically ill patients:  140 - 180 mg/dL   Results for Edgar Herrera, Edgar Herrera (MRN [id number]) as of 02/25/2018 14:14  Ref. Range 02/24/2018 07:46 02/24/2018 21:21  Glucose-Capillary Latest Ref Range: 65 - 99 mg/dL 313 (H) 282 (H)   Results for Edgar Herrera, Edgar Herrera (MRN [id number]) as of 02/25/2018 14:14  Ref. Range 02/25/2018 07:52 02/25/2018 12:46  Glucose-Capillary Latest Ref Range: 65 - 99 mg/dL 266 (H) 400 (H)     Home DM Meds: Glipizide 5 mg AM/ 2.5 mg PM       Metformin 1000 mg BID  Current Insulin Orders: Novolog Resistant Correction Scale/ SSI (0-20 units) TID AC + HS       Patient getting Jevity Tube Feed boluses 4 times per day TID and at bedtime.  Having significant glucose elevations.   MD- Please consider the following in-hospital insulin adjustments:  1. Start basal insulin: Recommend Lantus 12 units daily to start (0.15 units/kg dosing based on weight of 88 kg)  2. Start Novolog Tube Feed Coverage: Novolog 6 units TID AC + HS (to be given with each tube feed bolus- Hold if Tube feed bolus held for any reason)     --Will follow patient during hospitalization--  Wyn Quaker RN, MSN, CDE Diabetes Coordinator Inpatient Glycemic Control Team Team Pager: [phone] (8a-5p)

## 2018-02-26 ENCOUNTER — Ambulatory Visit
Admission: RE | Admit: 2018-02-26 | Discharge: 2018-02-26 | Disposition: A | Discharge status: Home / Self Care | Payer: No Typology Code available for payment source | Source: Ambulatory Visit | Attending: Radiation Oncology | Admitting: Radiation Oncology

## 2018-02-26 DIAGNOSIS — Z8589 Personal history of malignant neoplasm of other organs and systems: Secondary | ICD-10-CM

## 2018-02-26 DIAGNOSIS — Z9221 Personal history of antineoplastic chemotherapy: Secondary | ICD-10-CM

## 2018-02-26 LAB — GLUCOSE, CAPILLARY
Glucose-Capillary: 404 mg/dL — ABNORMAL HIGH (ref 65–99)
Glucose-Capillary: 178 mg/dL — ABNORMAL HIGH (ref 65–99)
Glucose-Capillary: 248 mg/dL — ABNORMAL HIGH (ref 65–99)
Glucose-Capillary: 370 mg/dL — ABNORMAL HIGH (ref 65–99)
Glucose-Capillary: 196 mg/dL — ABNORMAL HIGH (ref 65–99)
Glucose-Capillary: 308 mg/dL — ABNORMAL HIGH (ref 65–99)

## 2018-02-26 LAB — CBC WITH DIFFERENTIAL/PLATELET
BASOS ABS: 0 10*3/uL (ref 0.0–0.1)
Basophils Relative: 0 %
Eosinophils Absolute: 0 10*3/uL (ref 0.0–0.7)
Eosinophils Relative: 0 %
HCT: 23.9 % — ABNORMAL LOW (ref 39.0–52.0)
HEMOGLOBIN: 7.9 g/dL — AB (ref 13.0–17.0)
Lymphocytes Relative: 8 %
Lymphs Abs: 0.2 10*3/uL (ref 0.7–4.0)
MCH: 29 pg (ref 26.0–34.0)
MCHC: 33.1 g/dL (ref 30.0–36.0)
MCV: 87.9 fL (ref 78.0–100.0)
Monocytes Absolute: 0.3 10*3/uL (ref 0.1–1.0)
Monocytes Relative: 13 %
NEUTROS ABS: 1.8 10*3/uL (ref 1.7–7.7)
Neutrophils Relative %: 79 %
Platelets: 120 10*3/uL — ABNORMAL LOW (ref 150–400)
RBC: 2.72 MIL/uL — AB (ref 4.22–5.81)
RDW: 15 % (ref 11.5–15.5)
WBC: 2.3 10*3/uL — AB (ref 4.0–10.5)

## 2018-02-26 LAB — CULTURE, BLOOD (ROUTINE X 2)
CULTURE: NO GROWTH
Culture: NO GROWTH
SPECIAL REQUESTS: ADEQUATE

## 2018-02-26 LAB — HIV ANTIBODY (ROUTINE TESTING W REFLEX): HIV Screen 4th Generation wRfx: NONREACTIVE

## 2018-02-26 LAB — CREATININE, SERUM
CREATININE: 0.72 mg/dL (ref 0.61–1.24)
GFR calc Af Amer: >60 mL/min (ref >60)

## 2018-02-26 MED ORDER — BACITRACIN-NEOMYCIN-POLYMYXIN 400-5-5000 EX OINT
TOPICAL_OINTMENT | Freq: Every day | CUTANEOUS | Status: DC
  Administered 2018-02-26: 16:00:00 via TOPICAL
  Administered 2018-02-27 – 2018-02-28 (×2): 1 via TOPICAL
  Administered 2018-03-01: 11:00:00 via TOPICAL
  Filled 2018-02-26: qty 2
  Filled 2018-02-26: qty 1
  Filled 2018-02-26: qty 3
  Filled 2018-02-26: qty 5

## 2018-02-26 MED ORDER — FLUCONAZOLE IN SODIUM CHLORIDE 200-0.9 MG/100ML-% IV SOLN
200.0000 mg | INTRAVENOUS | Status: DC
  Administered 2018-02-26 – 2018-03-01 (×4): 200 mg via INTRAVENOUS
  Filled 2018-02-26 (×4): qty 100

## 2018-02-26 NOTE — Consult Note (Signed)
IP PROGRESS NOTE  Subjective:  Patient is afebrile over the past 24 hours.  Pain in the neck is improving.  No new complaints.   Objective: Vital signs in last 24 hours: Blood pressure (!) 114/54, pulse (!) 104, temperature 98.1 F (36.7 C), temperature source Oral, resp. rate 16, height 5\' 9"  (1.753 m), weight 204 lb 1.6 oz (92.6 kg), SpO2 98 %.  Intake/Output from previous day: 05/20 0701 - 05/21 0700 In: 600 [IV Piggyback:600] Out: -   Physical Exam: Alert, awake, oriented x3. HEENT: Radiation dermatitis without palpable fluctuance in the soft tissues Lungs: Clear to auscultation bilaterally. Cardiac: S1/S2, regular.  No murmurs, rubs, gallops. Abdomen: Soft, nontender, nondistended.   Extremities: No lower extremity edema Portacath: without erythema  Lab Results: Recent Labs    02/25/18 0444 02/26/18 0533  WBC 1.3* 2.3*  HGB 9.0* 7.9*  HCT 25.9* 23.9*  PLT 108* 120*    BMET Recent Labs    02/24/18 1222 02/25/18 0444 02/26/18 0533  NA  --  135  --   K  --  3.4*  --   CL  --  97*  --   CO2  --  27  --   GLUCOSE 391* 254*  --   BUN  --  24*  --   CREATININE  --  0.71 0.72  CALCIUM  --  8.1*  --     No results found for: CEA1  Studies/Results: Ct Soft Tissue Neck W Contrast  Result Date: 02/25/2018 CLINICAL DATA:  Head neck squamous cell carcinoma. Difficulty swallowing. EXAM: CT NECK WITH CONTRAST TECHNIQUE: Multidetector CT imaging of the neck was performed using the standard protocol following the bolus administration of intravenous contrast. CONTRAST:  48mL OMNIPAQUE IOHEXOL 300 MG/ML  SOLN COMPARISON:  CT neck 11/29/2017 FINDINGS: PHARYNX AND LARYNX: --Nasopharynx: Fossae of Rosenmuller are clear. Normal adenoid tonsils for age. --Oral cavity and oropharynx: The palatine and lingual tonsils are normal. The visible oral cavity and floor of mouth are normal. --Hypopharynx: Previously demonstrated mass within the left vallecula and encroaching on the left  parapharyngeal space is no longer present. There is no residual mass visualized. --Larynx: Normal epiglottis and pre-epiglottic space. Normal aryepiglottic and vocal folds. --Retropharyngeal space: No abscess, effusion or lymphadenopathy. SALIVARY GLANDS: --Parotid: No mass lesion or inflammation. No sialolithiasis or ductal dilatation. --Submandibular: Symmetric without inflammation. No sialolithiasis or ductal dilatation. --Sublingual: Normal. No ranula or other visible lesion of the base of tongue and floor of mouth. THYROID: Normal. LYMPH NODES: Hypodense left level 2 A node measures 9 mm. A slightly lower left level 2A node measures 10 mm. These nodes have decreased in size. Unchanged appearance of subcentimeter left level 3 nodes. No right-sided adenopathy. VASCULAR: Calcific atherosclerosis of both carotid bifurcations without high-grade stenosis. LIMITED INTRACRANIAL: Normal. VISUALIZED ORBITS: Normal. MASTOIDS AND VISUALIZED PARANASAL SINUSES: No fluid levels or advanced mucosal thickening. No mastoid effusion. SKELETON: No bony spinal canal stenosis. No lytic or blastic lesions. UPPER CHEST: Clear. OTHER: None. IMPRESSION: 1. No residual pharyngeal mass is visible. No specific finding to account for the reported dysphagia. 2. Decreased size of left level 2 A cervical lymph nodes, likely indicating response to therapy. Electronically Signed   By: Ulyses Jarred M.D.   On: 02/25/2018 17:35    Medications: I have reviewed the patient's current medications.  Assessment/Plan: 72 y.o. Male undergoing radiation therapy with concurrent systemic treatment with carboplatin and paclitaxel for diagnosis of squamous cell carcinoma of head and neck.  Admitted  for neutropenic fever with ANC of 0.9 and temperature 102.  Recurrent  fevers despite adequate antibiotic coverage for greater than 72 hours including fluconazole which was started on 02/23/2018.  Started on G-CSF and white blood cell count is  responding.  Recommendations: -Continue current antibiotic regimen.. -Patient will not receive any further systemic chemotherapy  -Continue G-CSF until neutrophil count is above 2000 for 2 consecutive days or above 5000 on any day.  At that time, antibiotics may be discontinued and patient can be discharged home. - Follow-up in my clinic next week.  I will assist in arranging the schedule.    LOS: 5 days   Ardath Sax, MD   02/26/2018, 3:44 PM

## 2018-02-26 NOTE — Progress Notes (Addendum)
PROGRESS NOTE  Ivor Kishi EUM:[id number] DOB: 09/28/1946 DOA: 02/21/2018 PCP: [facility]  HPI/Recap of past 24 hours: [place] Herrera is a 72 y.o. male with medical history significant of paroxysmal atrial fibrillation, type 2 diabetes on oral hypoglycemics, hyperlipidemia, hypertension, recent diagnosis of squamous cell cancer of throat coming in with fever without a source.  Patient was seen approximately 1 week ago and received carboplatin/paclitaxel, week #2 on 02/11/2018.  Patient then developed low-grade temperature of 100.7 on 02/13/2018.  Patient was seen by cancer Center on 02/15/2018 and started on Bactrim twice daily.  Patient's fever subsided however yesterday and today patient began having high fevers to 102.  Patient was also noted to be fairly tired and lethargic.  Patient has chronic cough and sputum production that he was told was from his head and neck cancer.  He does not have any shortness of breath, nausea, vomiting, diarrhea.  He is also receiving radiation therapy and has had significant skin changes over his neck from this.  ED Course: In the ED patient was noted to have a temperature of 102.  Patient's heart rate was 104.  Other vital signs were reassuring.  Labs were notable for white blood cell count of 1.2 with approximately 900 neutrophils.  Hemoglobin was 10.3, hematocrit was 30.9.  Platelets were 114.  CMP showed mild hyponatremia at 132.  Creatinine was 1.22 and BUN was elevated at 39.  Glucose was 255.  Chest x-ray was unremarkable.  Patient was admitted for fever without a source in the setting of recent chemotherapy.  02/22/18: Patient seen and examined at his bedside.  He reports odynophagia.  Mouthwash/ oral care ordered.  Has a PEG tube in place.  PEG tube feeding has been restarted.  02/23/18: persistent odynophagia. IV fluconazole and Nystatin mouth care ordered for oropharyngeal candidiasis. No other complaints.  02/24/2018: Neutropenic fever.  Started on  neutropenic precautions.  Oncology consulted.  Infectious disease consulted.  Highly appreciated.  02/25/18: febrile overnight w Tmax 100.5. Wbc improving from 0.74 to 1.3. No new complaints.  02/26/18: Afebrile in the last 24 hours. Odynophagia and pain in the neck improving. Wbc 2.3 from 0.73 post G-CSF.   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 Highly appreciated Hemonc consulted. Highly appreciated On G-CSF until neutrophil count is >2000 for 2 days or 5000 in any day.  Radiation dermatitis Continue IV vancomycin and IV cefepime Oncology consulted  Oropharyngeal candidiasis in the setting of recent chemotherapy  c/w  IV fluconazole Infectious disease following  Head and neck squamous cell carcinoma  last chemotherapy 02/15/2018 Oncology consulted  Cough, resolving c/w pulmonary toilet c/w hypersaline nebs c/w DuoNeb every 6 hours and every 2 hours as needed c/w liquid Mucinex per PEG tube  P. Afib, stable Rate controlled Continue Eliquis  Odynophagia, improving secondary to oropharyngeal candidiasis C/w mouthwash C/w IV fluconazole Continue PEG tube feeding Continue free water flushes  Leukopenia, improving Most likely 2/2 to chemo Wbc 1.3 from 0.7 Oncology following Repeat CBC in the morning Neutropenic  precautions  Pancytopenia, stable suspect secondary to chemotherapy Management per hematology/oncology Repeat CBC in the am  Euvolemic hyponatremia, resolved Repeat BMP in the morning  AKI, resolved Creatinine 0.7 on 02/24/18 Creatinine on presentation 1.22 Avoid nephrotoxic agents/hypotension/dehydration  Hypokalemia K+ 3.4 repleted with liquid Kcl supplement and 2gm IV magnesium Repeat BMP and mg2+level in the am   Code Status: Full code  Family Communication: None at bedside  Disposition Plan: Home when clinically stable   Consultants:  Hematology/oncology  ID  Procedures:  None  Antimicrobials:  Cefepime and IV vancomycin  IV fluconazole  DVT prophylaxis: Eliquis   Objective: Vitals:   02/26/18 0839 02/26/18 1410 02/26/18 2019 02/26/18 2115  BP:  (!) 114/54  134/61  Pulse:  (!) 104  100  Resp:  16  18  Temp:  98.1 F (36.7 C)  99.5 F (37.5 C)  TempSrc:  Oral  Oral  SpO2: 98% 98% 97% 97%  Weight:      Height:        Intake/Output Summary (Last 24 hours) at 02/26/2018 2208 Last data filed at 02/26/2018 1601 Gross per 24 hour  Intake 1248 ml  Output -  Net 1248 ml   Filed Weights   02/21/18 1249 02/22/18 0424 02/26/18 0551  Weight: 89.4 kg (197 lb) 88.7 kg (195 lb 9.6 oz) 92.6 kg (204 lb 1.6 oz)    Exam: 02/26/18  . General: 72 y.o. year-old male WD wN  NAD A&O x3 . Cardiovascular: RRR no rubs or gallops. No JVD or thyromegaly  . Respiratory: Clear to auscultation with no wheezes or rales. Good inspiratory effort. . Abdomen: Soft nontender nondistended with normal bowel sounds x4 quadrants. . Musculoskeletal: No lower extremity edema. 2/4 pulses in all 4 extremities. . Skin: Non-blanchable rash in lower extremities bilaterally.  Radiation dermatitis on the neck and upper chest. . Psychiatry: Mood is appropriate for condition and setting   Data Reviewed: CBC: Recent Labs  Lab 02/21/18 0642 02/22/18 0358 02/23/18 0830  02/24/18 0500 02/25/18 0444 02/26/18 0533  WBC 1.2* 1.3* 1.0* 0.7* 1.3* 2.3*  NEUTROABS 0.9*  --  0.6* 0.4* 0.8* 1.8  HGB 10.3* 9.9* 9.7* 8.8* 9.0* 7.9*  HCT 30.9* 29.8* 29.1* 26.1* 25.9* 23.9*  MCV 85.8 87.4 84.6 85.9 85.8 87.9  PLT 114* 105* 110* 102* 108* 818*   Basic Metabolic Panel: Recent Labs  Lab 02/21/18 0642 02/22/18 0358 02/23/18 0830 02/24/18 0500 02/24/18 1222 02/25/18 0444 02/26/18 0533  NA 132* 134*  --   --   --  135  --   K 5.3* 4.8  --   --   --  3.4*  --   CL 97* 100*  --   --   --  97*  --   CO2 26 23  --   --   --  27  --   GLUCOSE 255* 340*  --   --  391* 254*  --   BUN 39* 27*  --   --   --  24*  --   CREATININE 1.22 1.01 0.81 0.76  --  0.71 0.72  CALCIUM 8.4* 8.1*  --   --   --  8.1*  --    GFR: Estimated Creatinine Clearance: 92.5 mL/min (by C-G formula based on SCr of 0.72 mg/dL). Liver Function Tests: Recent Labs  Lab 02/21/18 0642 02/25/18 0444  AST  15 46*  ALT 22 96*  ALKPHOS 51 59  BILITOT 0.9 0.5  PROT 6.2* 6.0*  ALBUMIN 2.7* 2.4*   No results for input(s): LIPASE, AMYLASE in the last 168 hours. No results for input(s): AMMONIA in the last 168 hours. Coagulation Profile: No results for input(s): INR, PROTIME in the last 168 hours. Cardiac Enzymes: No results for input(s): CKTOTAL, CKMB, CKMBINDEX, TROPONINI in the last 168 hours. BNP (last 3 results) No results for input(s): PROBNP in the last 8760 hours. HbA1C: Recent Labs    02/24/18 0500  HGBA1C 7.7*   CBG: Recent Labs  Lab 02/25/18 2241 02/26/18 0743 02/26/18 1152 02/26/18 1709 02/26/18 2113  GLUCAP 361* 248* 370* 196* 308*   Lipid Profile: No results for input(s): CHOL, HDL, LDLCALC, TRIG, CHOLHDL, LDLDIRECT in the last 72 hours. Thyroid Function Tests: No results for input(s): TSH, T4TOTAL, FREET4, T3FREE, THYROIDAB in the last 72 hours. Anemia Panel: No results for input(s): VITAMINB12, FOLATE, FERRITIN, TIBC, IRON, RETICCTPCT in the last 72 hours. Urine  analysis:    Component Value Date/Time   COLORURINE YELLOW 02/21/2018 0812   APPEARANCEUR CLEAR 02/21/2018 0812   LABSPEC 1.020 02/21/2018 0812   PHURINE 6.0 02/21/2018 0812   GLUCOSEU 150 (A) 02/21/2018 0812   HGBUR NEGATIVE 02/21/2018 0812   BILIRUBINUR NEGATIVE 02/21/2018 0812   KETONESUR NEGATIVE 02/21/2018 0812   PROTEINUR 30 (A) 02/21/2018 0812   NITRITE NEGATIVE 02/21/2018 0812   LEUKOCYTESUR NEGATIVE 02/21/2018 0812   Sepsis Labs: @LABRCNTIP (procalcitonin:4,lacticidven:4)  ) Recent Results (from the past 240 hour(s))  Blood Culture (routine x 2)     Status: None   Collection Time: 02/21/18  6:43 AM  Result Value Ref Range Status   Specimen Description   Final    BLOOD LEFT ANTECUBITAL Performed at [facility], [place] [address], [place], [place] [address]    Special Requests   Final    BOTTLES DRAWN AEROBIC AND ANAEROBIC Blood Culture adequate volume Performed at [facility] [address], [place], [place] [address]    Culture   Final    NO GROWTH 5 DAYS Performed at [facility], [place] [address] [place], [place] [address]    Report Status 02/26/2018 FINAL  Final  Blood Culture (routine x 2)     Status: None   Collection Time: 02/21/18  8:12 AM  Result Value Ref Range Status   Specimen Description   Final    BLOOD RIGHT ANTECUBITAL Performed at [facility] [address], [place], [place] [address]    Special Requests   Final    BOTTLES DRAWN AEROBIC AND ANAEROBIC Blood Culture results may not be optimal due to an excessive volume of blood received in culture bottles Performed at [facility] [address], [place], [place] [address]    Culture   Final    NO GROWTH 5 DAYS Performed at [facility], [place] [address], [place], [place] [address]    Report Status 02/26/2018 FINAL  Final  Culture, blood (x 2)     Status: None (Preliminary result)   Collection Time:  02/24/18 10:05 AM  Result Value Ref Range Status   Specimen Description   Final    BLOOD RIGHT ANTECUBITAL Performed at [facility] [address] [place],  [address]    Special Requests   Final    BOTTLES DRAWN AEROBIC AND ANAEROBIC Blood Culture adequate volume Performed at [facility], [place] [address], [place], [place] [address]    Culture   Final    NO GROWTH 2 DAYS Performed at [facility] [address] [place], [place] [address]    Report Status PENDING  Incomplete  Culture, blood (x 2)     Status: None (Preliminary result)   Collection Time: 02/24/18 10:06 AM  Result Value Ref Range Status   Specimen Description   Final    BLOOD LEFT ANTECUBITAL Performed at [facility] [address], [place], [place] [address]    Special Requests   Final    BOTTLES DRAWN AEROBIC ONLY Blood Culture adequate volume Performed at [facility] [address] [place], [place] [address]    Culture   Final    NO GROWTH 2 DAYS Performed at [facility] [address] [place], [place] [address]    Report Status PENDING  Incomplete      Studies: No results found.  Scheduled Meds: . allopurinol  100 mg Per Tube Daily  . amLODipine  10 mg Per Tube Daily  . apixaban  5 mg Per Tube BID  . feeding supplement (JEVITY 1.2 CAL)  474 mL Per Tube TID WC & HS  . feeding supplement (PRO-STAT SUGAR FREE 64)  30 mL Per Tube BID  . guaiFENesin  15 mL Per Tube BID  . hydrochlorothiazide  12.5 mg Per Tube Daily  . insulin aspart  0-20 Units Subcutaneous TID WC  . insulin aspart  0-5 Units Subcutaneous QHS  . ipratropium-albuterol  3 mL Nebulization BID  . magic mouthwash w/lidocaine  1 mL Oral TID  . neomycin-bacitracin-polymyxin   Topical Daily  . pravastatin  40 mg Per Tube Daily  . sodium chloride HYPERTONIC  4 mL Nebulization Daily  . vitamin B-12  500 mcg Per Tube BID    Continuous  Infusions: . fluconazole (DIFLUCAN) IV Stopped (02/26/18 1450)  . piperacillin-tazobactam (ZOSYN)  IV Stopped (02/26/18 2001)  . vancomycin 1,500 mg (02/26/18 2206)     LOS: 5 days     Kayleen Memos, MD [facility] Pager [phone]  If 7PM-7AM, please contact night-coverage [email or web address] Password [facility] 02/26/2018, 10:08 PM

## 2018-02-26 NOTE — Progress Notes (Signed)
Subjective: No new complaints   Antibiotics:  Anti-infectives (From admission, onward)   Start     Dose/Rate Route Frequency Ordered Stop   02/25/18 1600  piperacillin-tazobactam (ZOSYN) IVPB 3.375 g     3.375 g 12.5 mL/hr over 240 Minutes Intravenous Every 8 hours 02/25/18 1505     02/23/18 1700  fluconazole (DIFLUCAN) IVPB 400 mg    Note to Pharmacy:  For oropharyngeal candidiasis   400 mg 100 mL/hr over 120 Minutes Intravenous Every 24 hours 02/23/18 1538 02/25/18 1929   02/23/18 1545  fluconazole (DIFLUCAN) IVPB 400 mg  Status:  Discontinued     400 mg 100 mL/hr over 120 Minutes Intravenous Every 24 hours 02/23/18 1537 02/23/18 1538   02/22/18 2200  vancomycin (VANCOCIN) 1,500 mg in sodium chloride 0.9 % 500 mL IVPB     1,500 mg 250 mL/hr over 120 Minutes Intravenous Every 24 hours 02/22/18 0948     02/21/18 2200  vancomycin (VANCOCIN) 1,250 mg in sodium chloride 0.9 % 250 mL IVPB  Status:  Discontinued     1,250 mg 166.7 mL/hr over 90 Minutes Intravenous Every 24 hours 02/21/18 0934 02/22/18 0948   02/21/18 1700  ceFEPIme (MAXIPIME) 2 g in sodium chloride 0.9 % 100 mL IVPB  Status:  Discontinued     2 g 200 mL/hr over 30 Minutes Intravenous Every 8 hours 02/21/18 1013 02/25/18 1444   02/21/18 1400  ceFEPIme (MAXIPIME) 2 g in sodium chloride 0.9 % 100 mL IVPB  Status:  Discontinued     2 g 200 mL/hr over 30 Minutes Intravenous Every 8 hours 02/21/18 1012 02/21/18 1013   02/21/18 0800  ceFEPIme (MAXIPIME) 2 g in sodium chloride 0.9 % 100 mL IVPB     2 g 200 mL/hr over 30 Minutes Intravenous  Once 02/21/18 0752 02/21/18 0956   02/21/18 0800  vancomycin (VANCOCIN) IVPB 1000 mg/200 mL premix     1,000 mg 200 mL/hr over 60 Minutes Intravenous  Once 02/21/18 0758 02/21/18 1450      Medications: Scheduled Meds: . allopurinol  100 mg Per Tube Daily  . amLODipine  10 mg Per Tube Daily  . apixaban  5 mg Per Tube BID  . feeding supplement (JEVITY 1.2 CAL)  474 mL Per  Tube TID WC & HS  . feeding supplement (PRO-STAT SUGAR FREE 64)  30 mL Per Tube BID  . guaiFENesin  15 mL Per Tube BID  . hydrochlorothiazide  12.5 mg Per Tube Daily  . insulin aspart  0-20 Units Subcutaneous TID WC  . insulin aspart  0-5 Units Subcutaneous QHS  . ipratropium-albuterol  3 mL Nebulization BID  . magic mouthwash w/lidocaine  1 mL Oral TID  . pravastatin  40 mg Per Tube Daily  . sodium chloride HYPERTONIC  4 mL Nebulization Daily  . vitamin B-12  500 mcg Per Tube BID   Continuous Infusions: . piperacillin-tazobactam (ZOSYN)  IV 3.375 g (02/26/18 0816)  . vancomycin Stopped (02/25/18 2346)   PRN Meds:.acetaminophen (TYLENOL) oral liquid 160 mg/5 mL **OR** acetaminophen, albuterol, guaiFENesin, HYDROcodone-acetaminophen, LORazepam, morphine injection, ondansetron **OR** ondansetron (ZOFRAN) IV, polyethylene glycol, polyvinyl alcohol    Objective: Weight change:   Intake/Output Summary (Last 24 hours) at 02/26/2018 0930 Last data filed at 02/26/2018 0010 Gross per 24 hour  Intake 600 ml  Output -  Net 600 ml   Blood pressure 113/68, pulse 79, temperature 98.4 F (36.9 C), temperature source Oral, resp. rate 16,  height 5\' 9"  (1.753 m), weight 204 lb 1.6 oz (92.6 kg), SpO2 98 %. Temp:  [98.4 F (36.9 C)-99.9 F (37.7 C)] 98.4 F (36.9 C) (05/21 0551) Pulse Rate:  [79-105] 79 (05/21 0551) Resp:  [14-18] 16 (05/21 0551) BP: (113-129)/(57-68) 113/68 (05/21 0551) SpO2:  [97 %-98 %] 98 % (05/21 0839) Weight:  [204 lb 1.6 oz (92.6 kg)] 204 lb 1.6 oz (92.6 kg) (05/21 0551)  Physical Exam: General: Alert and awake, oriented x3, not in any acute distress. HEENT: anicteric sclera, EOMI skin of neck see below  CVS regular rate, normal r,  no murmur rubs or gallops Chest: clear to auscultation bilaterally, no wheezing, rales or rhonchi Abdomen: soft nontender, nondistended, normal bowel sounds, Extremities: no  clubbing or edema noted bilaterally  Neuro: nonfocal  Skin  around neck erythematous changes and exfoliation    Feb 25, 2018:     Tongue with some raised material     May 21st looks better:     CBC: CBC Latest Ref Rng & Units 02/26/2018 02/25/2018 02/24/2018  WBC 4.0 - 10.5 K/uL 2.3(L) 1.3(LL) 0.7(LL)  Hemoglobin 13.0 - 17.0 g/dL 7.9(L) 9.0(L) 8.8(L)  Hematocrit 39.0 - 52.0 % 23.9(L) 25.9(L) 26.1(L)  Platelets 150 - 400 K/uL 120(L) 108(L) 102(L)     BMET Recent Labs    02/24/18 1222 02/25/18 0444 02/26/18 0533  NA  --  135  --   K  --  3.4*  --   CL  --  97*  --   CO2  --  27  --   GLUCOSE 391* 254*  --   BUN  --  24*  --   CREATININE  --  0.71 0.72  CALCIUM  --  8.1*  --      Liver Panel  Recent Labs    02/25/18 0444  PROT 6.0*  ALBUMIN 2.4*  AST 46*  ALT 96*  ALKPHOS 59  BILITOT 0.5       Sedimentation Rate No results for input(s): ESRSEDRATE in the last 72 hours. C-Reactive Protein No results for input(s): CRP in the last 72 hours.  Micro Results: Recent Results (from the past 720 hour(s))  Urine culture     Status: Abnormal   Collection Time: 02/05/18  2:34 AM  Result Value Ref Range Status   Specimen Description   Final    URINE, RANDOM Performed at [facility] [address], [place], [place] [address]    Special Requests   Final    NONE Performed at [facility], [place] [address] [place], [place] [address]    Culture (A)  Final    <10,000 COLONIES/mL INSIGNIFICANT GROWTH Performed at [facility] [address], [place], [place] [address]    Report Status 02/06/2018 FINAL  Final  Blood culture (routine x 2)     Status: None   Collection Time: 02/05/18  3:59 AM  Result Value Ref Range Status   Specimen Description   Final    BLOOD RIGHT ANTECUBITAL Performed at [facility] [address], [place], [place] [address]    Special Requests   Final    BOTTLES DRAWN AEROBIC AND ANAEROBIC Blood Culture results may not be  optimal due to an excessive volume of blood received in culture bottles Performed at [facility] [address], [place], [place] [address]    Culture   Final    NO GROWTH 5 DAYS Performed at [facility], [place] [address], [place], [place] [address]    Report Status 02/10/2018 FINAL  Final  Blood Culture (routine x 2)     Status: None (Preliminary result)   Collection Time: 02/21/18  6:43 AM  Result Value Ref Range Status   Specimen Description   Final    BLOOD LEFT ANTECUBITAL Performed at [facility] [address], [place], [place] [address]    Special Requests   Final    BOTTLES DRAWN AEROBIC AND ANAEROBIC Blood Culture adequate volume Performed at [facility] [address], [place], [place] [address]    Culture   Final    NO GROWTH 4 DAYS Performed at [facility], [place] [address], [place], [place] [address]    Report Status PENDING  Incomplete  Blood Culture (routine x 2)     Status: None (Preliminary result)   Collection Time: 02/21/18  8:12 AM  Result Value Ref Range Status   Specimen Description   Final    BLOOD RIGHT ANTECUBITAL Performed at [facility] [address], [place], [place] [address]    Special Requests   Final    BOTTLES DRAWN AEROBIC AND ANAEROBIC Blood Culture results may not be optimal due to an excessive volume of blood received in culture bottles Performed at [facility] [address] [place], [place] [address]    Culture   Final    NO GROWTH 4 DAYS Performed at [facility], [place] [address] [place], [place] [address]    Report Status PENDING  Incomplete  Culture, blood (x 2)     Status: None (Preliminary result)   Collection Time: 02/24/18 10:05 AM  Result Value Ref Range Status   Specimen Description   Final    BLOOD RIGHT ANTECUBITAL Performed at [facility] [address] [place], [place]  [address]    Special Requests   Final    BOTTLES DRAWN AEROBIC AND ANAEROBIC Blood Culture adequate volume Performed at [facility] [address], [place], [place] [address]    Culture   Final    NO GROWTH < 24 HOURS Performed at [facility] [address], [place], [place] [address]    Report Status PENDING  Incomplete  Culture, blood (x 2)     Status: None (Preliminary result)   Collection Time: 02/24/18 10:06 AM  Result Value Ref Range Status   Specimen Description   Final    BLOOD LEFT ANTECUBITAL Performed at [facility] [address], [place], [place] [address]    Special Requests   Final    BOTTLES DRAWN AEROBIC ONLY Blood Culture adequate volume Performed at [facility] [address], [place], [place] [address]    Culture   Final    NO GROWTH < 24 HOURS Performed at [facility] [address], [place],  [address]    Report Status PENDING  Incomplete    Studies/Results: Ct Soft Tissue Neck W Contrast  Result Date: 02/25/2018 CLINICAL DATA:  Head neck squamous cell carcinoma. Difficulty swallowing. EXAM: CT NECK WITH CONTRAST TECHNIQUE: Multidetector CT imaging of the neck was performed using the standard protocol following the bolus administration of intravenous contrast. CONTRAST:  89mL OMNIPAQUE IOHEXOL 300 MG/ML  SOLN COMPARISON:  CT neck 11/29/2017 FINDINGS: PHARYNX AND LARYNX: --Nasopharynx: Fossae of Rosenmuller are clear. Normal adenoid tonsils for age. --Oral cavity and  oropharynx: The palatine and lingual tonsils are normal. The visible oral cavity and floor of mouth are normal. --Hypopharynx: Previously demonstrated mass within the left vallecula and encroaching on the left parapharyngeal space is no longer present. There is no residual mass visualized. --Larynx: Normal epiglottis and pre-epiglottic space. Normal aryepiglottic and vocal folds. --Retropharyngeal space: No abscess,  effusion or lymphadenopathy. SALIVARY GLANDS: --Parotid: No mass lesion or inflammation. No sialolithiasis or ductal dilatation. --Submandibular: Symmetric without inflammation. No sialolithiasis or ductal dilatation. --Sublingual: Normal. No ranula or other visible lesion of the base of tongue and floor of mouth. THYROID: Normal. LYMPH NODES: Hypodense left level 2 A node measures 9 mm. A slightly lower left level 2A node measures 10 mm. These nodes have decreased in size. Unchanged appearance of subcentimeter left level 3 nodes. No right-sided adenopathy. VASCULAR: Calcific atherosclerosis of both carotid bifurcations without high-grade stenosis. LIMITED INTRACRANIAL: Normal. VISUALIZED ORBITS: Normal. MASTOIDS AND VISUALIZED PARANASAL SINUSES: No fluid levels or advanced mucosal thickening. No mastoid effusion. SKELETON: No bony spinal canal stenosis. No lytic or blastic lesions. UPPER CHEST: Clear. OTHER: None. IMPRESSION: 1. No residual pharyngeal mass is visible. No specific finding to account for the reported dysphagia. 2. Decreased size of left level 2 A cervical lymph nodes, likely indicating response to therapy. Electronically Signed   By: Ulyses Jarred M.D.   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 male with  72 y.o. male with hx of SCCa of throat. He last received CTX (carboplatin/paclitaxcel 5-6), his second cycle. Prior to this he had low grade temps (per his wife to 100.4). He was given amoxil fo rthis.  He then developed higher temps at home that have  persisted despite starting bactrim on 5-10. He came to ED 5-16 where he had temp 102, and and ANC of 900. He was also noted to have increased Cr (1.22 from 0.7). His CXR was (-).  He was noted to have erythema over his neck, most likely felt to be from his XRT.  He was started on vanco/cefepime. On 5-18 he had fluconazole added for thrush.  He was seen by H/O today and started on granix (GCSF).   #1 Neutropenic fevers: fevers are defervescing it seems and ANC coming up  No focal infection by exam  Will de-escalate antibiotics starting tomorrow  I dont think we need 400mg  of fluconazole will drop this      LOS: 5 days   Rhina Brackett [place] 02/26/2018, 9:30 AM

## 2018-02-26 NOTE — Progress Notes (Signed)
Inpatient Diabetes Program Recommendations  AACE/ADA: New Consensus Statement on Inpatient Glycemic Control (2015)  Target Ranges:  Prepandial:   less than 140 mg/dL      Peak postprandial:   less than 180 mg/dL (1-2 hours)      Critically ill patients:  140 - 180 mg/dL   Results for BONEShaydon, Lease (MRN [id number]) as of 02/26/2018 08:20  Ref. Range 02/25/2018 07:52 02/25/2018 12:46 02/25/2018 17:11 02/25/2018 22:41  Glucose-Capillary Latest Ref Range: 65 - 99 mg/dL 266 (H)  11 units NOVOLOG 400 (H)  20 units NOVOLOG' 236 (H)  7 units NOVOLOG 361 (H)  5 units NOVOLOG     Home DM Meds: Glipizide 5 mg AM/ 2.5 mg PM                             Metformin 1000 mg BID  Current Insulin Orders: Novolog Resistant Correction Scale/ SSI (0-20 units) TID AC + HS       Patient getting Jevity Tube Feed boluses 4 times per day TID and at bedtime.  Having significant glucose elevations.   MD- Please consider the following in-hospital insulin adjustments:  1. Start basal insulin: Recommend Lantus 12 units daily to start (0.15 units/kg dosing based on weight of 88 kg)  2. Start Novolog Tube Feed Coverage: Novolog 6 units TID AC + HS (to be given with each tube feed bolus- Hold if Tube feed bolus held for any reason)    --Will follow patient during hospitalization--  Wyn Quaker RN, MSN, CDE Diabetes Coordinator Inpatient Glycemic Control Team Team Pager: [phone] (8a-5p)

## 2018-02-27 ENCOUNTER — Ambulatory Visit
Admission: RE | Admit: 2018-02-27 | Discharge: 2018-02-27 | Disposition: A | Discharge status: Home / Self Care | Payer: No Typology Code available for payment source | Source: Ambulatory Visit | Attending: Radiation Oncology | Admitting: Radiation Oncology

## 2018-02-27 ENCOUNTER — Encounter: Payer: Self-pay | Admitting: *Deleted

## 2018-02-27 DIAGNOSIS — G893 Neoplasm related pain (acute) (chronic): Secondary | ICD-10-CM

## 2018-02-27 DIAGNOSIS — E118 Type 2 diabetes mellitus with unspecified complications: Secondary | ICD-10-CM

## 2018-02-27 LAB — GLUCOSE, CAPILLARY
Glucose-Capillary: 279 mg/dL — ABNORMAL HIGH (ref 65–99)
Glucose-Capillary: 355 mg/dL — ABNORMAL HIGH (ref 65–99)
Glucose-Capillary: 275 mg/dL — ABNORMAL HIGH (ref 65–99)
Glucose-Capillary: 237 mg/dL — ABNORMAL HIGH (ref 65–99)

## 2018-02-27 LAB — MAGNESIUM: Magnesium: 1.7 mg/dL (ref 1.7–2.4)

## 2018-02-27 LAB — BASIC METABOLIC PANEL
Anion gap: 10 (ref 5–15)
BUN: 22 mg/dL — ABNORMAL HIGH (ref 6–20)
CO2: 31 mmol/L (ref 22–32)
Calcium: 8.3 mg/dL — ABNORMAL LOW (ref 8.9–10.3)
Chloride: 94 mmol/L — ABNORMAL LOW (ref 101–111)
Creatinine, Ser: 0.74 mg/dL (ref 0.61–1.24)
GFR calc Af Amer: >60 mL/min (ref >60)
GFR calc non Af Amer: >60 mL/min (ref >60)
Glucose, Bld: 271 mg/dL — ABNORMAL HIGH (ref 65–99)
Potassium: 3.9 mmol/L (ref 3.5–5.1)
Sodium: 135 mmol/L (ref 135–145)

## 2018-02-27 LAB — HCV COMMENT:

## 2018-02-27 LAB — CBC WITH DIFFERENTIAL/PLATELET
BASOS ABS: 0 10*3/uL (ref 0.0–0.1)
BASOS PCT: 1 %
EOS ABS: 0 10*3/uL (ref 0.0–0.7)
Eosinophils Relative: 0 %
HEMATOCRIT: 24.2 % — AB (ref 39.0–52.0)
Hemoglobin: 8 g/dL — ABNORMAL LOW (ref 13.0–17.0)
LYMPHS PCT: 14 %
Lymphs Abs: 0.3 10*3/uL — ABNORMAL LOW (ref 0.7–4.0)
MCH: 28.9 pg (ref 26.0–34.0)
MCHC: 33.1 g/dL (ref 30.0–36.0)
MCV: 87.4 fL (ref 78.0–100.0)
MONOS PCT: 12 %
Monocytes Absolute: 0.2 10*3/uL (ref 0.1–1.0)
NEUTROS PCT: 73 %
Neutro Abs: 1.5 10*3/uL — ABNORMAL LOW (ref 1.7–7.7)
Platelets: 120 10*3/uL — ABNORMAL LOW (ref 150–400)
RBC: 2.77 MIL/uL — AB (ref 4.22–5.81)
RDW: 15 % (ref 11.5–15.5)
WBC: 2 10*3/uL — ABNORMAL LOW (ref 4.0–10.5)

## 2018-02-27 LAB — HEPATITIS C ANTIBODY (REFLEX): HCV Ab: <0.1 s/co ratio (ref 0.0–0.9)

## 2018-02-27 MED ORDER — TBO-FILGRASTIM 300 MCG/0.5ML ~~LOC~~ SOSY
300.0000 ug | PREFILLED_SYRINGE | Freq: Every day | SUBCUTANEOUS | Status: DC
  Administered 2018-02-27 – 2018-02-28 (×2): 300 ug via SUBCUTANEOUS
  Filled 2018-02-27 (×2): qty 0.5

## 2018-02-27 MED ORDER — SODIUM CHLORIDE 0.9% FLUSH: 10.0000 mL | INTRAVENOUS | Status: DC | PRN

## 2018-02-27 NOTE — Consult Note (Signed)
IP PROGRESS NOTE  Subjective:  Patient is afebrile over the past 24 hours.  Pain in the neck is improving.  Has been able to sleep in the bed for the first time.  Swelling in the lower extremities remains problem this patient has trouble continuing to elevate extremities when sitting in the chair..   Objective: Vital signs in last 24 hours: Blood pressure 121/65, pulse 82, temperature 98.4 F (36.9 C), temperature source Oral, resp. rate 18, height 5\' 9"  (1.753 m), weight 205 lb 11 oz (93.3 kg), SpO2 98 %.  Intake/Output from previous day: 05/21 0701 - 05/22 0700 In: 1148 [NG/GT:948; IV Piggyback:200] Out: -   Physical Exam: Alert, awake, oriented x3. HEENT: Radiation dermatitis without palpable fluctuance in the soft tissues Lungs: Clear to auscultation bilaterally. Cardiac: S1/S2, regular.  No murmurs, rubs, gallops. Abdomen: Soft, nontender, nondistended.   Extremities: 2+ bilateral lower extremity edema Portacath: without erythema  Lab Results: Recent Labs    02/26/18 0533 02/27/18 0500  WBC 2.3* 2.0*  HGB 7.9* 8.0*  HCT 23.9* 24.2*  PLT 120* 120*    BMET Recent Labs    02/25/18 0444 02/26/18 0533 02/27/18 0500  NA 135  --  135  K 3.4*  --  3.9  CL 97*  --  94*  CO2 27  --  31  GLUCOSE 254*  --  271*  BUN 24*  --  22*  CREATININE 0.71 0.72 0.74  CALCIUM 8.1*  --  8.3*    No results found for: CEA1  Studies/Results: Ct Soft Tissue Neck W Contrast  Result Date: 02/25/2018 CLINICAL DATA:  Head neck squamous cell carcinoma. Difficulty swallowing. EXAM: CT NECK WITH CONTRAST TECHNIQUE: Multidetector CT imaging of the neck was performed using the standard protocol following the bolus administration of intravenous contrast. CONTRAST:  55mL OMNIPAQUE IOHEXOL 300 MG/ML  SOLN COMPARISON:  CT neck 11/29/2017 FINDINGS: PHARYNX AND LARYNX: --Nasopharynx: Fossae of Rosenmuller are clear. Normal adenoid tonsils for age. --Oral cavity and oropharynx: The palatine and  lingual tonsils are normal. The visible oral cavity and floor of mouth are normal. --Hypopharynx: Previously demonstrated mass within the left vallecula and encroaching on the left parapharyngeal space is no longer present. There is no residual mass visualized. --Larynx: Normal epiglottis and pre-epiglottic space. Normal aryepiglottic and vocal folds. --Retropharyngeal space: No abscess, effusion or lymphadenopathy. SALIVARY GLANDS: --Parotid: No mass lesion or inflammation. No sialolithiasis or ductal dilatation. --Submandibular: Symmetric without inflammation. No sialolithiasis or ductal dilatation. --Sublingual: Normal. No ranula or other visible lesion of the base of tongue and floor of mouth. THYROID: Normal. LYMPH NODES: Hypodense left level 2 A node measures 9 mm. A slightly lower left level 2A node measures 10 mm. These nodes have decreased in size. Unchanged appearance of subcentimeter left level 3 nodes. No right-sided adenopathy. VASCULAR: Calcific atherosclerosis of both carotid bifurcations without high-grade stenosis. LIMITED INTRACRANIAL: Normal. VISUALIZED ORBITS: Normal. MASTOIDS AND VISUALIZED PARANASAL SINUSES: No fluid levels or advanced mucosal thickening. No mastoid effusion. SKELETON: No bony spinal canal stenosis. No lytic or blastic lesions. UPPER CHEST: Clear. OTHER: None. IMPRESSION: 1. No residual pharyngeal mass is visible. No specific finding to account for the reported dysphagia. 2. Decreased size of left level 2 A cervical lymph nodes, likely indicating response to therapy. Electronically Signed   By: Ulyses Jarred M.D.   On: 02/25/2018 17:35    Medications: I have reviewed the patient's current medications.  Assessment/Plan: 72 y.o. Male undergoing radiation therapy with concurrent systemic treatment  with carboplatin and paclitaxel for diagnosis of squamous cell carcinoma of head and neck.  Admitted for neutropenic fever with ANC of 0.9 and temperature 102.  Recurrent  fevers  despite adequate antibiotic coverage for greater than 72 hours including fluconazole which was started on 02/23/2018.  Started on G-CSF and white blood cell count is responding.  ANC is down to 1.5 today likely due to interruption in G-CSF supplementation yesterday.  Recommendations: -Continue current antibiotic regimen.. -Patient will not receive any further systemic chemotherapy  -Resume and continue G-CSF until neutrophil count is above 2000 for 2 consecutive days or above 5000 on any day.  At that time, antibiotics may be discontinued and patient can be discharged home. - Follow-up in my clinic next week.  I will assist in arranging the schedule.    LOS: 6 days   Ardath Sax, MD   02/27/2018, 10:30 AM

## 2018-02-27 NOTE — Progress Notes (Signed)
Oncology Nurse Navigator Documentation  Enroute to pt's room to check on his well-being, spoke with wife, arranged for him to see SLP next Tuesday during [facility] s/p RT and PUT. Pt sleeping comfortably in recliner when I arrived to his room.  Gayleen Orem, RN, BSN Head & Neck Oncology Nurse [facility] [place] [phone]

## 2018-02-27 NOTE — Progress Notes (Signed)
PROGRESS NOTE    Edgar Herrera  NWG:[id number] DOB: March 10, 1946 DOA: 02/21/2018 PCP: [facility]    Brief Narrative:  72 year old male who presented with fever.  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  Patient received chemotherapy, carboplatin/paclitaxel 7 days prior to hospitalization week #2.  Patient did develop a low-grade fever, and received Bactrim (5/10), with resolution of fevers.  24 hours prior to admission he had recurrent fever associated with lethargy and generalized weakness.  On his initial physical examination blood pressure 98/57, heart rate 104, respiratory rate 15, temperature 102 F, oxygen saturation 93%.  Dry mucous membranes, lungs clear to auscultation bilaterally, heart S1-2 present rhythmic, abdomen soft and nontender, no lower extremity edema.  Sodium 132, potassium 5.3, chloride 97, BUN 39, creatinine 1.22 glucose 255, BUN 39, white count 1.2, hemoglobin 10.3, hematocrit 30.9, platelets 114.  Urinalysis negative for infection, chest film negative for infiltrates.  EKG with atrial flutter.  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 Radiation induced dermatitis/ stomatitis. Pain controlled with analgesics, continue antibiotic therapy with Zosyn and topical neomycin.   2. Neutropenic fever. Patient has been afebrile, total wbc 2,0 with neutrophils 73%. Will continue to follow on cell count. Continue with  filgastrim.   3. Oropharyngeal candidiasis. Continue fluconazole, diet advanced to clears.   4. Head and neck squamous cell carcinoma. Continue nutrition per peg tube with good toleration.   5. AKI. Improved renal function with serum cr at 0,74, with K at 3,9 and serum bicarbonate at 31.   6. HTN. Continue blood pressure control with amlodipine and hctz.    7. T2DM. Will continue glucose cover and monitoring with insulin sliding scale, patient tolerating tube feedings well.   8. Atrial fibrillation. Rate controlled, continue anticoagulation with apixaban.   DVT prophylaxis: apixaban  Code Status:  full Family Communication: I spoke with patient's wife at the bedside and all questions were addressed.  Disposition Plan: pending leukopenia    Consultants:   Oncology   ID  Procedures:     Antimicrobials:   Zosyn   Fluconazole    Subjective: Patient feeling better, neck pain controlled with analgesics, no nausea or vomiting, tolerating clears and tube feedings.   Objective: Vitals:   02/26/18 2115 02/27/18 0615 02/27/18 0903 02/27/18 1437  BP: 134/61 121/65  (!) 124/56  Pulse: 100 82  75  Resp: 18 18  16   Temp: 99.5 F (37.5 C) 98.4 F (36.9 C)  98.1 F (36.7 C)  TempSrc: Oral Oral  Oral  SpO2: 97% 96% 98% 98%  Weight:  93.3 kg (205 lb 11 oz)    Height:        Intake/Output Summary (Last 24 hours) at 02/27/2018 1505 Last data filed at 02/27/2018 0826 Gross per 24 hour  Intake 11809 ml  Output -  Net 11809 ml   Filed Weights   02/22/18 0424 02/26/18 0551 02/27/18 0615  Weight: 88.7 kg (  195 lb 9.6 oz) 92.6 kg (204 lb 1.6 oz) 93.3 kg (205 lb 11 oz)    Examination:   General: deconditioned  Neurology: Awake and alert, non focal  E ENT: no pallor, no icterus, oral mucosa moist Cardiovascular: No JVD. S1-S2 present, rhythmic, no gallops, rubs, or murmurs. No lower extremity edema. Pulmonary: vesicular breath sounds bilaterally, adequate air movement, no  wheezing, rhonchi or rales. Gastrointestinal. Abdomen with, no organomegaly, non tender, no rebound or guarding Skin. Extensive erythema at the left base of the neck, with white thick secretions.  Musculoskeletal: no joint deformities     Data Reviewed: I have personally reviewed following labs and imaging studies  CBC: Recent Labs  Lab 02/23/18 0830 02/24/18 0500 02/25/18 0444 02/26/18 0533 02/27/18 0500  WBC 1.0* 0.7* 1.3* 2.3* 2.0*  NEUTROABS 0.6* 0.4* 0.8* 1.8 1.5*  HGB 9.7* 8.8* 9.0* 7.9* 8.0*  HCT 29.1* 26.1* 25.9* 23.9* 24.2*  MCV 84.6 85.9 85.8 87.9 87.4  PLT 110* 102* 108* 120* 182*   Basic Metabolic Panel: Recent Labs  Lab 02/21/18 0642 02/22/18 0358 02/23/18 0830 02/24/18 0500 02/24/18 1222 02/25/18 0444 02/26/18 0533 02/27/18 0500  NA 132* 134*  --   --   --  135  --  135  K 5.3* 4.8  --   --   --  3.4*  --  3.9  CL 97* 100*  --   --   --  97*  --  94*  CO2 26 23  --   --   --  27  --  31  GLUCOSE 255* 340*  --   --  391* 254*  --  271*  BUN 39* 27*  --   --   --  24*  --  22*  CREATININE 1.22 1.01 0.81 0.76  --  0.71 0.72 0.74  CALCIUM 8.4* 8.1*  --   --   --  8.1*  --  8.3*  MG  --   --   --   --   --   --   --  1.7   GFR: Estimated Creatinine Clearance: 92.7 mL/min (by C-G formula based on SCr of 0.74 mg/dL). Liver Function Tests: Recent Labs  Lab 02/21/18 0642 02/25/18 0444  AST 15 46*  ALT 22 96*  ALKPHOS 51 59  BILITOT 0.9 0.5  PROT 6.2* 6.0*  ALBUMIN 2.7* 2.4*   No results for input(s): LIPASE, AMYLASE in the last 168 hours. No results for input(s): AMMONIA in the last 168 hours. Coagulation Profile: No results for input(s): INR, PROTIME in the last 168 hours. Cardiac Enzymes: No results for input(s): CKTOTAL, CKMB, CKMBINDEX, TROPONINI in the last 168 hours. BNP (last 3 results) No results for input(s): PROBNP in the last 8760 hours. HbA1C: No results for input(s): HGBA1C in the last 72 hours. CBG: Recent Labs  Lab  02/26/18 1152 02/26/18 1709 02/26/18 2113 02/27/18 0754 02/27/18 1113  GLUCAP 370* 196* 308* 279* 355*   Lipid Profile: No results for input(s): CHOL, HDL, LDLCALC, TRIG, CHOLHDL, LDLDIRECT in the last 72 hours. Thyroid Function Tests: No results for input(s): TSH, T4TOTAL, FREET4, T3FREE, THYROIDAB in the last 72 hours. Anemia Panel: No results for input(s): VITAMINB12, FOLATE, FERRITIN, TIBC, IRON, RETICCTPCT in the last 72 hours.    Radiology Studies: I have reviewed all of the imaging during this hospital visit personally     Scheduled Meds: . allopurinol  100 mg Per Tube Daily  . amLODipine  10 mg Per  Tube Daily  . apixaban  5 mg Per Tube BID  . feeding supplement (JEVITY 1.2 CAL)  474 mL Per Tube TID WC & HS  . feeding supplement (PRO-STAT SUGAR FREE 64)  30 mL Per Tube BID  . guaiFENesin  15 mL Per Tube BID  . hydrochlorothiazide  12.5 mg Per Tube Daily  . insulin aspart  0-20 Units Subcutaneous TID WC  . insulin aspart  0-5 Units Subcutaneous QHS  . ipratropium-albuterol  3 mL Nebulization BID  . magic mouthwash w/lidocaine  1 mL Oral TID  . neomycin-bacitracin-polymyxin   Topical Daily  . pravastatin  40 mg Per Tube Daily  . Tbo-filgastrim (GRANIX) SQ  300 mcg Subcutaneous q1800  . vitamin B-12  500 mcg Per Tube BID   Continuous Infusions: . fluconazole (DIFLUCAN) IV Stopped (02/27/18 1159)  . piperacillin-tazobactam (ZOSYN)  IV Stopped (02/27/18 1159)     LOS: 6 days        Esker Dever Gerome Apley, MD [facility] Pager [phone]

## 2018-02-27 NOTE — Progress Notes (Signed)
Subjective: No new complaints   Antibiotics:  Anti-infectives (From admission, onward)   Start     Dose/Rate Route Frequency Ordered Stop   02/26/18 1000  fluconazole (DIFLUCAN) IVPB 200 mg     200 mg 100 mL/hr over 60 Minutes Intravenous Every 24 hours 02/26/18 0933     02/25/18 1600  piperacillin-tazobactam (ZOSYN) IVPB 3.375 g     3.375 g 12.5 mL/hr over 240 Minutes Intravenous Every 8 hours 02/25/18 1505     02/23/18 1700  fluconazole (DIFLUCAN) IVPB 400 mg    Note to Pharmacy:  For oropharyngeal candidiasis   400 mg 100 mL/hr over 120 Minutes Intravenous Every 24 hours 02/23/18 1538 02/25/18 1929   02/23/18 1545  fluconazole (DIFLUCAN) IVPB 400 mg  Status:  Discontinued     400 mg 100 mL/hr over 120 Minutes Intravenous Every 24 hours 02/23/18 1537 02/23/18 1538   02/22/18 2200  vancomycin (VANCOCIN) 1,500 mg in sodium chloride 0.9 % 500 mL IVPB  Status:  Discontinued     1,500 mg 250 mL/hr over 120 Minutes Intravenous Every 24 hours 02/22/18 0948 02/27/18 1050   02/21/18 2200  vancomycin (VANCOCIN) 1,250 mg in sodium chloride 0.9 % 250 mL IVPB  Status:  Discontinued     1,250 mg 166.7 mL/hr over 90 Minutes Intravenous Every 24 hours 02/21/18 0934 02/22/18 0948   02/21/18 1700  ceFEPIme (MAXIPIME) 2 g in sodium chloride 0.9 % 100 mL IVPB  Status:  Discontinued     2 g 200 mL/hr over 30 Minutes Intravenous Every 8 hours 02/21/18 1013 02/25/18 1444   02/21/18 1400  ceFEPIme (MAXIPIME) 2 g in sodium chloride 0.9 % 100 mL IVPB  Status:  Discontinued     2 g 200 mL/hr over 30 Minutes Intravenous Every 8 hours 02/21/18 1012 02/21/18 1013   02/21/18 0800  ceFEPIme (MAXIPIME) 2 g in sodium chloride 0.9 % 100 mL IVPB     2 g 200 mL/hr over 30 Minutes Intravenous  Once 02/21/18 0752 02/21/18 0956   02/21/18 0800  vancomycin (VANCOCIN) IVPB 1000 mg/200 mL premix     1,000 mg 200 mL/hr over 60 Minutes Intravenous  Once 02/21/18 0758 02/21/18 1450       Medications: Scheduled Meds: . allopurinol  100 mg Per Tube Daily  . amLODipine  10 mg Per Tube Daily  . apixaban  5 mg Per Tube BID  . feeding supplement (JEVITY 1.2 CAL)  474 mL Per Tube TID WC & HS  . feeding supplement (PRO-STAT SUGAR FREE 64)  30 mL Per Tube BID  . guaiFENesin  15 mL Per Tube BID  . hydrochlorothiazide  12.5 mg Per Tube Daily  . insulin aspart  0-20 Units Subcutaneous TID WC  . insulin aspart  0-5 Units Subcutaneous QHS  . ipratropium-albuterol  3 mL Nebulization BID  . magic mouthwash w/lidocaine  1 mL Oral TID  . neomycin-bacitracin-polymyxin   Topical Daily  . pravastatin  40 mg Per Tube Daily  . Tbo-filgastrim (GRANIX) SQ  300 mcg Subcutaneous q1800  . vitamin B-12  500 mcg Per Tube BID   Continuous Infusions: . fluconazole (DIFLUCAN) IV Stopped (02/27/18 1159)  . piperacillin-tazobactam (ZOSYN)  IV Stopped (02/27/18 1159)   PRN Meds:.acetaminophen (TYLENOL) oral liquid 160 mg/5 mL **OR** acetaminophen, albuterol, guaiFENesin, HYDROcodone-acetaminophen, LORazepam, morphine injection, ondansetron **OR** ondansetron (ZOFRAN) IV, polyethylene glycol, polyvinyl alcohol, sodium chloride flush    Objective: Weight change: 1 lb 9.4 oz (0.721  kg)  Intake/Output Summary (Last 24 hours) at 02/27/2018 1624 Last data filed at 02/27/2018 0826 Gross per 24 hour  Intake 11759 ml  Output -  Net 11759 ml   Blood pressure (!) 124/56, pulse 75, temperature 98.1 F (36.7 C), temperature source Oral, resp. rate 16, height 5\' 9"  (1.753 m), weight 205 lb 11 oz (93.3 kg), SpO2 98 %. Temp:  [98.1 F (36.7 C)-99.5 F (37.5 C)] 98.1 F (36.7 C) (05/22 1437) Pulse Rate:  [75-100] 75 (05/22 1437) Resp:  [16-18] 16 (05/22 1437) BP: (121-134)/(56-65) 124/56 (05/22 1437) SpO2:  [96 %-98 %] 98 % (05/22 1437) Weight:  [205 lb 11 oz (93.3 kg)] 205 lb 11 oz (93.3 kg) (05/22 0615)  Physical Exam: General: Alert and awake, oriented x3, not in any acute distress. HEENT:  anicteric sclera, EOMI skin of neck see below  CVS regular rate, normal r,  no murmur rubs or gallops Chest: clear to auscultation bilaterally, no wheezing, rales or rhonchi Abdomen: soft nontender, nondistended, normal bowel sounds, Extremities: no  clubbing or edema noted bilaterally  Neuro: nonfocal  Skin around neck erythematous changes and exfoliation    Feb 25, 2018:     Tongue with some raised material     May 21st looks better:     CBC: CBC Latest Ref Rng & Units 02/27/2018 02/26/2018 02/25/2018  WBC 4.0 - 10.5 K/uL 2.0(L) 2.3(L) 1.3(LL)  Hemoglobin 13.0 - 17.0 g/dL 8.0(L) 7.9(L) 9.0(L)  Hematocrit 39.0 - 52.0 % 24.2(L) 23.9(L) 25.9(L)  Platelets 150 - 400 K/uL 120(L) 120(L) 108(L)     BMET Recent Labs    02/25/18 0444 02/26/18 0533 02/27/18 0500  NA 135  --  135  K 3.4*  --  3.9  CL 97*  --  94*  CO2 27  --  31  GLUCOSE 254*  --  271*  BUN 24*  --  22*  CREATININE 0.71 0.72 0.74  CALCIUM 8.1*  --  8.3*     Liver Panel  Recent Labs    02/25/18 0444  PROT 6.0*  ALBUMIN 2.4*  AST 46*  ALT 96*  ALKPHOS 59  BILITOT 0.5       Sedimentation Rate No results for input(s): ESRSEDRATE in the last 72 hours. C-Reactive Protein No results for input(s): CRP in the last 72 hours.  Micro Results: Recent Results (from the past 720 hour(s))  Urine culture     Status: Abnormal   Collection Time: 02/05/18  2:34 AM  Result Value Ref Range Status   Specimen Description   Final    URINE, RANDOM Performed at [facility] [address], [place], [place] [address]    Special Requests   Final    NONE Performed at [facility], [place] [address], [place], [place] [address]    Culture (A)  Final    <10,000 COLONIES/mL INSIGNIFICANT GROWTH Performed at [facility] [address], [place], [place] [address]    Report Status 02/06/2018 FINAL  Final  Blood culture (routine x 2)     Status: None   Collection  Time: 02/05/18  3:59 AM  Result Value Ref Range Status   Specimen Description   Final    BLOOD RIGHT ANTECUBITAL Performed at [facility] [address] [place], [place] [address]    Special Requests   Final    BOTTLES DRAWN AEROBIC AND ANAEROBIC Blood Culture results may not be optimal due to an excessive volume  of blood received in culture bottles Performed at [facility], [place] [address] [place], [place] [address]    Culture   Final    NO GROWTH 5 DAYS Performed at [facility], [place] [address], [place], [place] [address]    Report Status 02/10/2018 FINAL  Final  Blood Culture (routine x 2)     Status: None   Collection Time: 02/21/18  6:43 AM  Result Value Ref Range Status   Specimen Description   Final    BLOOD LEFT ANTECUBITAL Performed at [facility] [address] [place], [place] [address]    Special Requests   Final    BOTTLES DRAWN AEROBIC AND ANAEROBIC Blood Culture adequate volume Performed at [facility] [address] [place], [place] [address]    Culture   Final    NO GROWTH 5 DAYS Performed at [facility], [place] [address], [place], [place] [address]    Report Status 02/26/2018 FINAL  Final  Blood Culture (routine x 2)     Status: None   Collection Time: 02/21/18  8:12 AM  Result Value Ref Range Status   Specimen Description   Final    BLOOD RIGHT ANTECUBITAL Performed at [facility] [address] [place], [place] [address]    Special Requests   Final    BOTTLES DRAWN AEROBIC AND ANAEROBIC Blood Culture results may not be optimal due to an excessive volume of blood received in culture bottles Performed at [facility] [address], [place], [place] [address]    Culture   Final    NO GROWTH 5 DAYS Performed at [facility], [place] [address] [place], [place] [address]    Report Status 02/26/2018 FINAL  Final   Culture, blood (x 2)     Status: None (Preliminary result)   Collection Time: 02/24/18 10:05 AM  Result Value Ref Range Status   Specimen Description   Final    BLOOD RIGHT ANTECUBITAL Performed at [facility] [address] [place], [place] [address]    Special Requests   Final    BOTTLES DRAWN AEROBIC AND ANAEROBIC Blood Culture adequate volume Performed at [facility] [address], [place], [place] [address]    Culture   Final    NO GROWTH 3 DAYS Performed at [facility], [place] [address], [place], [place] [address]    Report Status PENDING  Incomplete  Culture, blood (x 2)     Status: None (Preliminary result)   Collection Time: 02/24/18 10:06 AM  Result Value Ref Range Status   Specimen Description   Final    BLOOD LEFT ANTECUBITAL Performed at [facility] [address], [place], [place] [address]    Special Requests   Final    BOTTLES DRAWN AEROBIC ONLY Blood Culture adequate volume Performed at [facility] [address], [place],  [address]    Culture   Final    NO GROWTH 3 DAYS Performed at [facility], [place] [address], [place],  [address]    Report Status PENDING  Incomplete    Studies/Results: Ct Soft Tissue Neck W Contrast  Result Date: 02/25/2018 CLINICAL DATA:  Head neck squamous cell carcinoma. Difficulty swallowing. EXAM: CT NECK WITH CONTRAST TECHNIQUE: Multidetector CT imaging of the neck was performed using the standard protocol following the bolus administration of intravenous contrast.  CONTRAST:  70mL OMNIPAQUE IOHEXOL 300 MG/ML  SOLN COMPARISON:  CT neck 11/29/2017 FINDINGS: PHARYNX AND LARYNX: --Nasopharynx: Fossae of Rosenmuller are clear. Normal adenoid tonsils for age. --Oral cavity and oropharynx: The palatine and lingual tonsils are normal. The visible oral cavity and floor of mouth are normal. --Hypopharynx: Previously demonstrated mass  within the left vallecula and encroaching on the left parapharyngeal space is no longer present. There is no residual mass visualized. --Larynx: Normal epiglottis and pre-epiglottic space. Normal aryepiglottic and vocal folds. --Retropharyngeal space: No abscess, effusion or lymphadenopathy. SALIVARY GLANDS: --Parotid: No mass lesion or inflammation. No sialolithiasis or ductal dilatation. --Submandibular: Symmetric without inflammation. No sialolithiasis or ductal dilatation. --Sublingual: Normal. No ranula or other visible lesion of the base of tongue and floor of mouth. THYROID: Normal. LYMPH NODES: Hypodense left level 2 A node measures 9 mm. A slightly lower left level 2A node measures 10 mm. These nodes have decreased in size. Unchanged appearance of subcentimeter left level 3 nodes. No right-sided adenopathy. VASCULAR: Calcific atherosclerosis of both carotid bifurcations without high-grade stenosis. LIMITED INTRACRANIAL: Normal. VISUALIZED ORBITS: Normal. MASTOIDS AND VISUALIZED PARANASAL SINUSES: No fluid levels or advanced mucosal thickening. No mastoid effusion. SKELETON: No bony spinal canal stenosis. No lytic or blastic lesions. UPPER CHEST: Clear. OTHER: None. IMPRESSION: 1. No residual pharyngeal mass is visible. No specific finding to account for the reported dysphagia. 2. Decreased size of left level 2 A cervical lymph nodes, likely indicating response to therapy. Electronically Signed   By: Ulyses Jarred M.D.   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 male with  72 y.o. male with hx of  SCCa of throat. He last received CTX (carboplatin/paclitaxcel 5-6), his second cycle. Prior to this he had low grade temps (per his wife to 100.4). He was given amoxil fo rthis.  He then developed higher temps at home that have persisted despite starting bactrim on 5-10. He came to ED 5-16 where he had temp 102, and and ANC of 900. He was also noted to have increased Cr (1.22 from 0.7). His CXR was (-).  He was noted to have erythema over his neck, most likely felt to be from his XRT.  He was started on vanco/cefepime. On 5-18 he had fluconazole added for thrush.  He was seen by H/O today and started on granix (GCSF).   #1 Neutropenic fevers: fevers are defervescing it seems and ANC coming up  No focal infection by exam  I am going to drop the vancomycin and continue Zosyn and fluconazole.      LOS: 6 days   Alcide Evener 02/27/2018, 4:24 PM

## 2018-02-27 NOTE — Progress Notes (Signed)
Inpatient Diabetes Program Recommendations  AACE/ADA: New Consensus Statement on Inpatient Glycemic Control (2015)  Target Ranges:  Prepandial:   less than 140 mg/dL      Peak postprandial:   less than 180 mg/dL (1-2 hours)      Critically ill patients:  140 - 180 mg/dL   Results for Edgar Herrera, Edgar Herrera (MRN [id number]) as of 02/27/2018 10:56  Ref. Range 02/26/2018 07:43 02/26/2018 11:52 02/26/2018 17:09 02/26/2018 21:13  Glucose-Capillary Latest Ref Range: 65 - 99 mg/dL 248 (H) 370 (H) 196 (H) 308 (H)   Results for Edgar Herrera, Edgar Herrera (MRN [id number]) as of 02/27/2018 10:56  Ref. Range 02/27/2018 07:54  Glucose-Capillary Latest Ref Range: 65 - 99 mg/dL 279 (H)    Home DM Meds:Glipizide 5 mg AM/ 2.5 mg PM Metformin 1000 mg BID  Current Insulin Orders:Novolog Resistant Correction Scale/ SSI (0-20 units) TID AC + HS       Patient getting Jevity Tube Feed boluses 4 times per day TID and at bedtime.  Gets 474 ml of Jevity with each bolus.  Having significant glucose elevations.   MD- Please consider the following in-hospital insulin adjustments:  1. Start basal insulin: Recommend Lantus 14 units daily to start (0.15 units/kg dosing based on weight of 93 kg)  2. Start Novolog Tube Feed Coverage: Novolog 6 units TID AC + HS (to be given with each tube feed bolus- Hold if Tube feed bolus held for any reason)    --Will follow patient during hospitalization--  Wyn Quaker RN, MSN, CDE Diabetes Coordinator Inpatient Glycemic Control Team Team Pager: [phone] (8a-5p)

## 2018-02-28 ENCOUNTER — Telehealth: Payer: Self-pay | Admitting: Hematology and Oncology

## 2018-02-28 ENCOUNTER — Ambulatory Visit
Admission: RE | Admit: 2018-02-28 | Discharge: 2018-02-28 | Disposition: A | Discharge status: Home / Self Care | Payer: No Typology Code available for payment source | Source: Ambulatory Visit | Attending: Radiation Oncology | Admitting: Radiation Oncology

## 2018-02-28 LAB — GLUCOSE, CAPILLARY
Glucose-Capillary: 224 mg/dL — ABNORMAL HIGH (ref 65–99)
Glucose-Capillary: 325 mg/dL — ABNORMAL HIGH (ref 65–99)
Glucose-Capillary: 195 mg/dL — ABNORMAL HIGH (ref 65–99)
Glucose-Capillary: 261 mg/dL — ABNORMAL HIGH (ref 65–99)

## 2018-02-28 LAB — BASIC METABOLIC PANEL
Anion gap: 12 (ref 5–15)
BUN: 23 mg/dL — AB (ref 6–20)
CHLORIDE: 93 mmol/L — AB (ref 101–111)
CO2: 31 mmol/L (ref 22–32)
Calcium: 8.3 mg/dL — ABNORMAL LOW (ref 8.9–10.3)
Creatinine, Ser: 0.8 mg/dL (ref 0.61–1.24)
GFR calc Af Amer: >60 mL/min (ref >60)
GFR calc non Af Amer: >60 mL/min (ref >60)
GLUCOSE: 261 mg/dL — AB (ref 65–99)
POTASSIUM: 3.9 mmol/L (ref 3.5–5.1)
Sodium: 136 mmol/L (ref 135–145)

## 2018-02-28 LAB — CBC WITH DIFFERENTIAL/PLATELET
BASOS PCT: 0 %
Basophils Absolute: 0 10*3/uL (ref 0.0–0.1)
EOS PCT: 0 %
Eosinophils Absolute: 0 10*3/uL (ref 0.0–0.7)
HCT: 23.7 % — ABNORMAL LOW (ref 39.0–52.0)
HEMOGLOBIN: 7.6 g/dL — AB (ref 13.0–17.0)
LYMPHS PCT: 12 %
Lymphs Abs: 0.5 10*3/uL — ABNORMAL LOW (ref 0.7–4.0)
MCH: 28.3 pg (ref 26.0–34.0)
MCHC: 32.1 g/dL (ref 30.0–36.0)
MCV: 88.1 fL (ref 78.0–100.0)
Monocytes Absolute: 0.1 10*3/uL (ref 0.1–1.0)
Monocytes Relative: 3 %
NEUTROS ABS: 3.3 10*3/uL (ref 1.7–7.7)
Neutrophils Relative %: 85 %
Platelets: 121 10*3/uL — ABNORMAL LOW (ref 150–400)
RBC: 2.69 MIL/uL — ABNORMAL LOW (ref 4.22–5.81)
RDW: 15.4 % (ref 11.5–15.5)
WBC: 3.9 10*3/uL — ABNORMAL LOW (ref 4.0–10.5)

## 2018-02-28 MED ORDER — IPRATROPIUM-ALBUTEROL 0.5-2.5 (3) MG/3ML IN SOLN: 3.0000 mL | Freq: Four times a day (QID) | RESPIRATORY_TRACT | Status: DC

## 2018-02-28 MED ORDER — IPRATROPIUM-ALBUTEROL 0.5-2.5 (3) MG/3ML IN SOLN
3.0000 mL | Freq: Two times a day (BID) | RESPIRATORY_TRACT | Status: DC
  Administered 2018-02-28 – 2018-03-01 (×2): 3 mL via RESPIRATORY_TRACT
  Filled 2018-02-28 (×2): qty 3

## 2018-02-28 MED ORDER — IPRATROPIUM-ALBUTEROL 0.5-2.5 (3) MG/3ML IN SOLN
3.0000 mL | Freq: Two times a day (BID) | RESPIRATORY_TRACT | Status: DC
  Administered 2018-02-28: 3 mL via RESPIRATORY_TRACT

## 2018-02-28 NOTE — Consult Note (Signed)
IP PROGRESS NOTE  Subjective:  Low-grade fever spike yesterday.  Otherwise, no new symptoms.   Objective: Vital signs in last 24 hours: Blood pressure 129/62, pulse 99, temperature 97.6 F (36.4 C), temperature source Oral, resp. rate 16, height 5\' 9"  (1.753 m), weight 211 lb 10.3 oz (96 kg), SpO2 94 %.  Intake/Output from previous day: 05/22 0701 - 05/23 0700 In: 15189 [IO/NG:[id number]; IV Piggyback:800] Out: -   Physical Exam: Alert, awake, oriented x3. HEENT: Radiation dermatitis without palpable fluctuance in the soft tissues Lungs: Clear to auscultation bilaterally. Cardiac: S1/S2, regular.  No murmurs, rubs, gallops. Abdomen: Soft, nontender, nondistended.   Extremities: 2+ bilateral lower extremity edema Portacath: without erythema  Lab Results: Recent Labs    02/27/18 0500 02/28/18 0403  WBC 2.0* 3.9*  HGB 8.0* 7.6*  HCT 24.2* 23.7*  PLT 120* 121*    BMET Recent Labs    02/27/18 0500 02/28/18 0403  NA 135 136  K 3.9 3.9  CL 94* 93*  CO2 31 31  GLUCOSE 271* 261*  BUN 22* 23*  CREATININE 0.74 0.80  CALCIUM 8.3* 8.3*    No results found for: CEA1  Studies/Results: No results found.  Medications: I have reviewed the patient's current medications.  Assessment/Plan: 72 y.o. Male undergoing radiation therapy with concurrent systemic treatment with carboplatin and paclitaxel for diagnosis of squamous cell carcinoma of head and neck.  Admitted for neutropenic fever with ANC of 0.9 and temperature 102.  Recurrent  fevers despite adequate antibiotic coverage for greater than 72 hours including fluconazole which was started on 02/23/2018.  Started on G-CSF and white blood cell count is responding.  ANC is 3.3 today   Recommendations: -Continue current antibiotic regimen.. -Patient will not receive any further systemic chemotherapy  -Continue G-CSF today.  If neutrophil count is over 2000 tomorrow, patient can be discharged from the hospital - Follow-up in my  clinic next week.  I will assist in arranging the schedule.    LOS: 7 days   Ardath Sax, MD   02/28/2018, 8:00 AM

## 2018-02-28 NOTE — Progress Notes (Signed)
Nutrition Follow-up  INTERVENTION:   Continue Jevity 1.2 @ 2 cans (237 ml each) QID with Prostat BID via feeding tube; total regimen providing 2480 kcal, 135 grams of protein, and 1528 ml H2O.   NUTRITION DIAGNOSIS:   Inadequate oral intake related to inability to eat as evidenced by per patient/family report, NPO status.  Ongoing.  GOAL:   Patient will meet greater than or equal to 90% of their needs  Meeting with TF.  MONITOR:   PO intake, TF tolerance, Skin, Labs, Weight trends, I & O's  ASSESSMENT:   73 y.o. M admitted on 02/21/18 for fever. Pt currently undergoing ChRT for head and neck squamous cell carcinoma (tongue). Pt s/p feeding tube placement per RD note 02/08/18; pt goes to the [facility] for enteral formulas. PMH HLD, HTN, hx of prostate cancer, a fib, T2DM. Pt currently drinks alcohol (occasional beer/wine: 2x monthly).   Patient currently tolerating bolus feeds of Jevity 1.2, 474 ml QID w/ 30 ml Prostat BID. This meets pt's estimated needs. CBGs have been elevated and DM coordinator and MD are trying to control blood sugars via insulin.  Pt has developed thrush, has been ordered [employer]. Now on clear liquids.   Pt to resume home regimen: Isosource 1.5, 6 cartons daily to meet needs.    Pt's weight is now +16 lb since 5/17.   Medications: Magic Mouthwash TID, Vitamin B-12 tablet BID Labs reviewed: CBGs: 224-325 Mg WNL   Diet Order:   Diet Order           Diet clear liquid Room service appropriate? Yes; Fluid consistency: Thin  Diet effective now          EDUCATION NEEDS:   Education needs have been addressed  Skin:  Skin Assessment: Reviewed RN Assessment  Last BM:  02/20/18  Height:   Ht Readings from Last 1 Encounters:  02/21/18 5\' 9"  (1.753 m)    Weight:   Wt Readings from Last 1 Encounters:  02/28/18 211 lb 10.3 oz (96 kg)    Ideal Body Weight:  72.72 kg  BMI:  Body mass index is 31.25 kg/m.  Estimated Nutritional Needs:   Kcal:   2300-2500 kcal  Protein:  115-130 grams  Fluid:  >/= 2.4 L  Clayton Bibles, MS, RD, LDN [facility] Dietitian Pager: [phone] After Hours Pager: [phone]

## 2018-02-28 NOTE — Telephone Encounter (Signed)
Appointments scheduled and patient notified per 5/23 sch msg

## 2018-02-28 NOTE — Progress Notes (Signed)
PROGRESS NOTE    Edgar Herrera  DXI:[id number] DOB: October 22, 1945 DOA: 02/21/2018 PCP: [facility]    Brief Narrative:  72 year old male who presented with fever.  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  Patient received chemotherapy, carboplatin/paclitaxel 7 days prior to hospitalization week #2.  Patient did develop a low-grade fever, and received Bactrim (5/10), with resolution of fevers.  24 hours prior to admission he had recurrent fever associated with lethargy and generalized weakness.  On his initial physical examination blood pressure 98/57, heart rate 104, respiratory rate 15, temperature 102 F, oxygen saturation 93%.  Dry mucous membranes, lungs clear to auscultation bilaterally, heart S1-2 present rhythmic, abdomen soft and nontender, no lower extremity edema.  Sodium 132, potassium 5.3, chloride 97, BUN 39, creatinine 1.22 glucose 255, BUN 39, white count 1.2, hemoglobin 10.3, hematocrit 30.9, platelets 114.  Urinalysis negative for infection, chest film negative for infiltrates.  EKG with atrial flutter.  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 Radiation induced dermatitis/ stomatitis  - continue Pain control with analgesics, continue antibiotic therapy with Zosyn and topical neomycin.   2. Neutropenic fever. Patient has been afebrile, total wbc 2,0 with neutrophils 73%. Will continue to follow on cell count. If  neutrophil count gets to 2000 plan will be to d/c next am  3. Oropharyngeal candidiasis. Continue fluconazole, diet advanced to clears.   4. Head and neck squamous cell carcinoma. Continue nutrition per peg tube with good toleration.   5. AKI. Improved renal function with serum cr at 0,74, with K at 3,9 and serum bicarbonate at 31.   6. HTN. Continue blood pressure control with amlodipine and hctz.    7. T2DM. Continue SSI  8. Atrial fibrillation. Rate controlled, continue anticoagulation with apixaban.   DVT prophylaxis: apixaban  Code Status:  full Family Communication: I spoke with patient's wife at the bedside and all questions were addressed.  Disposition Plan: pending leukopenia    Consultants:   Oncology   ID  Procedures:     Antimicrobials:   Zosyn   Fluconazole    Subjective: No new complaints  Objective: Vitals:   02/27/18 2058 02/27/18 2226 02/28/18 0521 02/28/18 1428  BP: 129/61  129/62 111/62  Pulse: 84  99 94  Resp: 20  16 18   Temp: (!) 100.4 F (38 C) 99.6 F (37.6 C) 97.6 F (36.4 C) 98.6 F (37 C)  TempSrc: Oral  Oral Oral  SpO2: 95%  94% 95%  Weight:   96 kg (211 lb 10.3 oz)   Height:        Intake/Output Summary (Last 24 hours) at 02/28/2018 1641 Last data filed at 02/28/2018 0825 Gross per 24 hour  Intake 3760 ml  Output -  Net 3760 ml   Filed Weights   02/26/18 0551 02/27/18 0615 02/28/18 0521  Weight: 92.6 kg (204 lb 1.6 oz) 93.3 kg (205 lb 11 oz) 96 kg (211 lb  10.3 oz)    Examination:   General: Pt is deconditioned, awake and alert, in nad. Neurology: Awake and alert, non focal  E ENT: no pallor, no icterus, oral mucosa moist Cardiovascular: No JVD. S1-S2 present, rhythmic, no gallops, rubs, or murmurs. No lower extremity edema. Pulmonary: vesicular breath sounds bilaterally, adequate air movement, no wheezing, rhonchi or rales. Gastrointestinal. Abdomen with, no organomegaly, non tender, no rebound or guarding Skin.  Extensive erythema at the left base of the neck, with white thick secretions.  Musculoskeletal: no joint deformities   Data Reviewed: I have personally reviewed following labs and imaging studies  CBC: Recent Labs  Lab 02/24/18 0500 02/25/18 0444 02/26/18 0533 02/27/18 0500 02/28/18 0403  WBC 0.7* 1.3* 2.3* 2.0* 3.9*  NEUTROABS 0.4* 0.8* 1.8 1.5* 3.3  HGB 8.8* 9.0* 7.9* 8.0* 7.6*  HCT 26.1* 25.9* 23.9* 24.2* 23.7*  MCV 85.9 85.8 87.9 87.4 88.1  PLT 102* 108* 120* 120* 073*   Basic Metabolic Panel: Recent Labs  Lab 02/22/18 0358  02/24/18 0500 02/24/18 1222 02/25/18 0444 02/26/18 0533 02/27/18 0500 02/28/18 0403  NA 134*  --   --   --  135  --  135 136  K 4.8  --   --   --  3.4*  --  3.9 3.9  CL 100*  --   --   --  97*  --  94* 93*  CO2 23  --   --   --  27  --  31 31  GLUCOSE 340*  --   --  391* 254*  --  271* 261*  BUN 27*  --   --   --  24*  --  22* 23*  CREATININE 1.01   < > 0.76  --  0.71 0.72 0.74 0.80  CALCIUM 8.1*  --   --   --  8.1*  --  8.3* 8.3*  MG  --   --   --   --   --   --  1.7  --    < > = values in this interval not displayed.   GFR: Estimated Creatinine Clearance: 94 mL/min (by C-G formula based on SCr of 0.8 mg/dL). Liver Function Tests: Recent Labs  Lab 02/25/18 0444  AST 46*  ALT 96*  ALKPHOS 59  BILITOT 0.5  PROT 6.0*  ALBUMIN 2.4*   No results for input(s): LIPASE, AMYLASE in the last 168 hours. No results for input(s): AMMONIA in the last 168 hours. Coagulation Profile: No results for input(s): INR, PROTIME in the last 168 hours. Cardiac Enzymes: No results for input(s): CKTOTAL, CKMB, CKMBINDEX, TROPONINI in the last 168 hours. BNP (last 3 results) No results for input(s): PROBNP in the last 8760 hours. HbA1C: No results for input(s): HGBA1C in the last 72 hours. CBG: Recent Labs  Lab 02/27/18 1113 02/27/18 1639 02/27/18 2053 02/28/18 0714 02/28/18 1229  GLUCAP 355* 275* 237* 224* 325*   Lipid Profile: No results for  input(s): CHOL, HDL, LDLCALC, TRIG, CHOLHDL, LDLDIRECT in the last 72 hours. Thyroid Function Tests: No results for input(s): TSH, T4TOTAL, FREET4, T3FREE, THYROIDAB in the last 72 hours. Anemia Panel: No results for input(s): VITAMINB12, FOLATE, FERRITIN, TIBC, IRON, RETICCTPCT in the last 72 hours.    Radiology Studies: I have reviewed all of the imaging during this hospital visit personally     Scheduled Meds: . allopurinol  100 mg Per Tube Daily  . amLODipine  10 mg Per Tube Daily  . apixaban  5 mg Per Tube BID  . feeding supplement (JEVITY 1.2 CAL)  474 mL Per Tube TID WC & HS  . feeding supplement (PRO-STAT SUGAR FREE 64)  30 mL Per Tube BID  . guaiFENesin  15 mL Per Tube BID  . hydrochlorothiazide  12.5 mg Per Tube Daily  . insulin aspart  0-20 Units Subcutaneous TID WC  . insulin aspart  0-5 Units Subcutaneous QHS  . ipratropium-albuterol  3 mL Nebulization BID  . magic mouthwash w/lidocaine  1 mL Oral TID  . neomycin-bacitracin-polymyxin   Topical Daily  . pravastatin  40 mg Per Tube Daily  . Tbo-filgastrim (GRANIX) SQ  300 mcg Subcutaneous q1800  . vitamin B-12  500 mcg Per Tube BID   Continuous Infusions: . fluconazole (DIFLUCAN) IV Stopped (02/28/18 1030)  . piperacillin-tazobactam (ZOSYN)  IV Stopped (02/28/18 1211)     LOS: 7 days    Velvet Bathe, MD [facility] Pager [phone]

## 2018-02-28 NOTE — Progress Notes (Signed)
Inpatient Diabetes Program Recommendations  AACE/ADA: New Consensus Statement on Inpatient Glycemic Control (2015)  Target Ranges:  Prepandial:   less than 140 mg/dL      Peak postprandial:   less than 180 mg/dL (1-2 hours)      Critically ill patients:  140 - 180 mg/dL   Results for BONEHenrick, Mcgue (MRN [id number]) as of 02/28/2018 08:58  Ref. Range 02/27/2018 07:54 02/27/2018 11:13 02/27/2018 16:39 02/27/2018 20:53  Glucose-Capillary Latest Ref Range: 65 - 99 mg/dL 279 (H)  11 units NOVOLOG  355 (H)  20 units NOVOLOG  275 (H)  11 units NOVOLOG  237 (H)  2 units NOVOLOG    Results for BONEThorin, Starner (MRN [id number]) as of 02/28/2018 08:58  Ref. Range 02/28/2018 07:14  Glucose-Capillary Latest Ref Range: 65 - 99 mg/dL 224 (H)  7 units NOVOLOG     Home DM Meds:Glipizide 5 mg AM/ 2.5 mg PM Metformin 1000 mg BID  Current Insulin Orders:Novolog Resistant Correction Scale/ SSI (0-20 units) TID AC + HS       Patient getting Jevity Tube Feed boluses 4 times per day TID and at bedtime.  Gets 474 ml of Jevity with each bolus.  Having glucose elevations consistently >200 mg/dl.    MD- Please consider the following in-hospital insulin adjustments:  1. Start basal insulin: Recommend Lantus 14 units daily to start (0.15 units/kg dosing based on weight of 93 kg)  2. Start Novolog Tube Feed Coverage: Novolog 6 units TID AC + HS (to be given with each tube feed bolus- Hold if Tube feed bolus held for any reason)     --Will follow patient during hospitalization--  Wyn Quaker RN, MSN, CDE Diabetes Coordinator Inpatient Glycemic Control Team Team Pager: [phone] (8a-5p)

## 2018-02-28 NOTE — Progress Notes (Signed)
Pharmacy Antibiotic Note  Edgar Herrera is a 72 y.o. male with tongue cancer currently undergoing XRT + chemotherapy presented to the ED on 02/21/2018 with c/o fever and weakness.  Pharmacy consulted to dose/monitor zosyn. Patient is also receiving fluconazole. ID following.  Today, 02/28/2018: - Tmax 100.4 wbc 3.9 - SCr 0.8 is stable   Plan: - Continue Zosyn 3.375gm IV Q8h, infused over 4hrs for anaerobic coverage - pharmacy will sign off and follow peripherally  Height: 5\' 9"  (175.3 cm) Weight: 211 lb 10.3 oz (96 kg) IBW/kg (Calculated) : 70.7  Temp (24hrs), Avg:98.9 F (37.2 C), Min:97.6 F (36.4 C), Max:100.4 F (38 C)  Recent Labs  Lab 02/24/18 0500 02/25/18 0444 02/26/18 0533 02/27/18 0500 02/28/18 0403  WBC 0.7* 1.3* 2.3* 2.0* 3.9*  CREATININE 0.76 0.71 0.72 0.74 0.80    Estimated Creatinine Clearance: 94 mL/min (by C-G formula based on SCr of 0.8 mg/dL).    No Known Allergies   Thank you for allowing pharmacy to be a part of this patient's care.  Dolly Rias RPh 02/28/2018, 7:43 AM Pager [phone]

## 2018-03-01 ENCOUNTER — Inpatient Hospital Stay: Payer: No Typology Code available for payment source

## 2018-03-01 ENCOUNTER — Other Ambulatory Visit: Payer: Self-pay

## 2018-03-01 ENCOUNTER — Inpatient Hospital Stay: Payer: No Typology Code available for payment source | Admitting: Hematology and Oncology

## 2018-03-01 ENCOUNTER — Ambulatory Visit
Admission: RE | Admit: 2018-03-01 | Discharge: 2018-03-01 | Disposition: A | Discharge status: Home / Self Care | Payer: No Typology Code available for payment source | Source: Ambulatory Visit | Attending: Radiation Oncology | Admitting: Radiation Oncology

## 2018-03-01 LAB — CBC WITH DIFFERENTIAL/PLATELET
BASOS ABS: 0 10*3/uL (ref 0.0–0.1)
Basophils Relative: 0 %
EOS ABS: 0 10*3/uL (ref 0.0–0.7)
Eosinophils Relative: 0 %
HEMATOCRIT: 23.7 % — AB (ref 39.0–52.0)
Hemoglobin: 7.7 g/dL — ABNORMAL LOW (ref 13.0–17.0)
LYMPHS ABS: 0.3 10*3/uL (ref 0.7–4.0)
LYMPHS PCT: 5 %
MCH: 28.8 pg (ref 26.0–34.0)
MCHC: 32.5 g/dL (ref 30.0–36.0)
MCV: 88.8 fL (ref 78.0–100.0)
Monocytes Absolute: 0.4 10*3/uL (ref 0.1–1.0)
Monocytes Relative: 7 %
Neutro Abs: 5.4 10*3/uL (ref 1.7–7.7)
Neutrophils Relative %: 88 %
Platelets: 137 10*3/uL — ABNORMAL LOW (ref 150–400)
RBC: 2.67 MIL/uL — AB (ref 4.22–5.81)
RDW: 15.8 % — AB (ref 11.5–15.5)
WBC: 6.1 10*3/uL (ref 4.0–10.5)

## 2018-03-01 LAB — GLUCOSE, CAPILLARY
Glucose-Capillary: 224 mg/dL — ABNORMAL HIGH (ref 65–99)
Glucose-Capillary: 305 mg/dL — ABNORMAL HIGH (ref 65–99)

## 2018-03-01 LAB — CULTURE, BLOOD (ROUTINE X 2)
CULTURE: NO GROWTH
CULTURE: NO GROWTH
Special Requests: ADEQUATE
Special Requests: ADEQUATE

## 2018-03-01 MED ORDER — HEPARIN SOD (PORK) LOCK FLUSH 100 UNIT/ML IV SOLN
500.0000 [IU] | Freq: Once | INTRAVENOUS | Status: DC
  Filled 2018-03-01: qty 5

## 2018-03-01 NOTE — Progress Notes (Signed)
[facility] Follow-up Visit:  Assessment: Squamous cell carcinoma of base of tongue ([place]) 72 y.o.  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  Agree with the current clinical stage II based on clinical T3, clinical N1 disease.  Based on the age and comorbidities, patient is not a candidate for cisplatin chemotherapy, but will likely be able to tolerate concurrent weekly carboplatin/paclitaxel combination administered with radiotherapy.  Patient presents to the clinic to continue systemic therapy therapy.  Appears to be tolerating infusion well so far.  Increasing amount of pain consistent with progressive mucositis from radiation.  Clinical evaluation lab work-up permissive to proceed with the next cycle of systemic chemotherapy.  Plan: -Proceed with systemic therapy with carboplatin/paclitaxel, week #4. -Start liquid morphine for pain control -Return to clinic in 1 week: Labs, possible next dose of systemic chemotherapy.   Voice recognition software was used and creation of this note. Despite my best effort at editing the text, some misspelling/errors may have occurred.  Orders Placed This Encounter  Procedures  . CBC with Differential (Cancer Center Only)    Standing Status:   Future    Number of Occurrences:   1    Standing Expiration Date:   02/09/2019  . CMP ([place] only)    Standing Status:   Future    Number of Occurrences:   1    Standing Expiration Date:   02/09/2019  . Magnesium    Standing Status:   Future    Number of Occurrences:   1    Standing Expiration Date:   02/08/2019    Cancer Staging Squamous cell carcinoma of base of tongue (HCC) Staging form: Pharynx - HPV-Mediated Oropharynx, AJCC 8th Edition - Clinical: Stage II (cT3, cN1, cM0, p16+) - Signed by [name], MD on 01/01/2018   All questions were answered.  . The patient knows to call the clinic with any  problems, questions or concerns.  This note was electronically signed.    History of Presenting Illness Edgar Herrera is a 72 y.o. male followed in the [facility] for diagnosis of squamous cell carcinoma of tongue base, referred by Dr. [name]. Patient initially presented to a [place] emergency department 11/29/17 with complaints of intermittent dysphasia and hemoptysis episode.  CT scan of the neck obtained at the time demonstrated a large exophytic mass. Patient was seen by Dr.S Isac Caddy at [facility] on 12/10/17 and underwent a fiberoptic laryngoscopy on 12/17/17.  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  Additional staging imaging was obtained with PET/CT demonstrating no evidence of distant metastatic disease, but Lt base of the tongue mass, ipsilateral adenopathy.  Patient was evaluated by [facility] for possible tors procedure and was not found to be a suitable candidate according to Dr. Conley Canal.  Patient was subsequently evaluated by Dr. [name] who has referred the patient to our clinic for possible concurrent multimodality therapy.  Patient presents to the clinic today to continue systemic component of his treatment with carboplatin and paclitaxel.  The interim, patient had a febrile episode with fever up to 100.7.  Was evaluated by emergency room and found not to be neutropenic.  Fever has resolved.  Patient indicates increasing discomfort in the mouth and worsening sore throat, and now interfering with food intake and sleep.  Oncological/hematological History:   Squamous cell carcinoma of base of tongue (  [place])   12/17/2017 Initial Diagnosis    Squamous cell carcinoma of base of tongue ([place]):  -- laryngoscopic biopsy of the mass at the base of the tongue base positive for invasive poorly differentiated carcinoma positive for p40 and p16      01/01/2018 Cancer  Staging    Staging form: Pharynx - HPV-Mediated Oropharynx, AJCC 8th Edition - Clinical: Stage II (cT3, cN1, cM0, p16+) - Signed by [name], MD on 01/01/2018       01/16/2018 -  Radiation Therapy    Dr [name]:      01/18/2018 -  Chemotherapy    Carboplatin AUC 2, d1 + paclitaxel 29m/m2, d1 QWk --Week #1, 01/18/18: --Week #2, 01/24/18: --Week #3, 02/01/18:       Medical History: Past Medical History:  Diagnosis Date  . Allergic rhinitis   . Atrial fibrillation ([place]   . Diabetes mellitus without complication ([place]   . HLD (hyperlipidemia) 02/21/2018  . Hyperlipidemia   . Hypertension   . Prostate cancer ([place]   . T2DM (type 2 diabetes mellitus) ([place] 02/21/2018    Surgical History: Past Surgical History:  Procedure Laterality Date  . KNEE SURGERY Bilateral    2007 and 1998,   . PROSTATECTOMY  12/15/2006  . SKIN GRAFT     as a child left elbow  . TONSILECTOMY/ADENOIDECTOMY WITH MYRINGOTOMY     as a child    Family History: Family History  Problem Relation Age of Onset  . Lung cancer Mother   . Hypertension Mother   . Breast cancer Mother   . Heart attack Father   . Hypertension Sister   . Hypertension Brother   . Hypertension Sister     Social History: Social History   Socioeconomic History  . Marital status: Married    Spouse name: Not on file  . Number of children: Not on file  . Years of education: Not on file  . Highest education level: Not on file  Occupational History  . Not on file  Social Needs  . Financial resource strain: Not on file  . Food insecurity:    Worry: Not on file    Inability: Not on file  . Transportation needs:    Medical: Not on file    Non-medical: Not on file  Tobacco Use  . Smoking status: Never Smoker  . Smokeless tobacco: Never Used  Substance and Sexual Activity  . Alcohol use: Yes    Comment: reports occasional beer or wine. maybe two times monthly  . Drug use: No  . Sexual activity: Not on file   Lifestyle  . Physical activity:    Days per week: Not on file    Minutes per session: Not on file  . Stress: Not on file  Relationships  . Social connections:    Talks on phone: Not on file    Gets together: Not on file    Attends religious service: Not on file    Active member of club or organization: Not on file    Attends meetings of clubs or organizations: Not on file    Relationship status: Not on file  . Intimate partner violence:    Fear of current or ex partner: Not on file    Emotionally abused: Not on file    Physically abused: Not on file    Forced sexual activity: Not on file  Other Topics Concern  . Not on file  Social History Narrative  . Not on file  Allergies: No Known Allergies  Medications:  Current Outpatient Medications  Medication Sig Dispense Refill  . allopurinol (ZYLOPRIM) 100 MG tablet Take 100 mg by mouth daily.    Marland Kitchen amLODipine (NORVASC) 10 MG tablet Take 10 mg by mouth daily.    Marland Kitchen apixaban (ELIQUIS) 5 MG TABS tablet Take 5 mg by mouth 2 (two) times daily.    . Artificial Tear Solution (SYSTANE CONTACTS) SOLN Place 1 drop into both eyes daily.     . cyanocobalamin 500 MCG tablet Take 500 mcg by mouth 2 (two) times daily.    Marland Kitchen dexamethasone (DECADRON) 4 MG tablet Take 2 tablets (8 mg total) by mouth daily. Start the day after chemotherapy for 2 days. 30 tablet 1  . glipiZIDE (GLUCOTROL) 5 MG tablet Take 2.5-5 mg by mouth See admin instructions. Take 1 tablet before breakfast and Take 1/2 tablet before dinner.    Marland Kitchen HYDROcodone-acetaminophen (HYCET) 7.5-325 mg/15 ml solution Take 10 mLs by mouth 4 (four) times daily as needed for moderate pain.    Marland Kitchen lidocaine (XYLOCAINE) 2 % solution Patient: Mix 1part 2% viscous lidocaine, 1part H20. Swish & swallow 70m of diluted mixture, 380m before meals and at bedtime, up to QID 100 mL 5  . lidocaine-prilocaine (EMLA) cream Apply to affected area once (Patient taking differently: Apply 1 application topically  as needed. ) 30 g 3  . LORazepam (ATIVAN) 0.5 MG tablet Take 1 tablet (0.5 mg total) by mouth every 6 (six) hours as needed (Nausea or vomiting). (Patient taking differently: Take 0.5 mg by mouth every 6 (six) hours as needed for anxiety (Nausea and Vomiting). ) 30 tablet 0  . metFORMIN (GLUCOPHAGE) 1000 MG tablet Take 1,000 mg by mouth 2 (two) times daily with a meal.    . ondansetron (ZOFRAN) 8 MG tablet Take 1 tablet (8 mg total) by mouth 2 (two) times daily as needed for refractory nausea / vomiting. Start on day 3 after chemo. (Patient not taking: Reported on 02/08/2018) 30 tablet 1  . pravastatin (PRAVACHOL) 40 MG tablet Take 40 mg by mouth daily.    . prochlorperazine (COMPAZINE) 10 MG tablet Take 1 tablet (10 mg total) by mouth every 6 (six) hours as needed (Nausea or vomiting). (Patient not taking: Reported on 02/08/2018) 30 tablet 1  . sodium fluoride (FLUORISHIELD) 1.1 % GEL dental gel Instill one drop of gel per tooth space of fluoride tray. Place over teeth for 5 minutes. Remove. Spit out excess. Repeat nightly. (Patient taking differently: Place 1 application onto teeth at bedtime. Instill one drop of gel per tooth space of fluoride tray. Place over teeth for 5 minutes. Remove. Spit out excess. Repeat nightly.) 120 mL prn   No current facility-administered medications for this visit.    Facility-Administered Medications Ordered in Other Visits  Medication Dose Route Frequency Provider Last Rate Last Dose  . acetaminophen (TYLENOL) solution 650 mg  650 mg Oral Q6H PRN Purohit, Shrey C, MD   650 mg at 02/28/18 2110   Or  . acetaminophen (TYLENOL) suppository 650 mg  650 mg Rectal Q4H PRN Purohit, ShKonrad DoloresMD   650 mg at 02/22/18 2138  . albuterol (PROVENTIL) (2.5 MG/3ML) 0.083% nebulizer solution 2.5 mg  2.5 mg Nebulization Q4H PRN HaIrene Pap, DO      . allopurinol (ZYLOPRIM) tablet 100 mg  100 mg Per Tube Daily Purohit, Shrey C, MD   100 mg at 03/01/18 0848  . amLODipine (NORVASC)  tablet 10 mg  10  mg Per Tube Daily Purohit, Konrad Dolores, MD   10 mg at 03/01/18 0848  . apixaban (ELIQUIS) tablet 5 mg  5 mg Per Tube BID Purohit, Konrad Dolores, MD   5 mg at 03/01/18 0847  . feeding supplement (JEVITY 1.2 CAL) liquid 474 mL  474 mL Per Tube TID WC & HS Irene Pap N, DO 237 mL/hr at 03/01/18 1307 474 mL at 03/01/18 1307  . feeding supplement (PRO-STAT SUGAR FREE 64) liquid 30 mL  30 mL Per Tube BID Irene Pap N, DO   30 mL at 03/01/18 0849  . fluconazole (DIFLUCAN) IVPB 200 mg  200 mg Intravenous Q24H Tommy Medal, Lavell Islam, MD   Stopped at 03/01/18 1251  . guaiFENesin (ROBITUSSIN) 100 MG/5ML solution 100 mg  5 mL Oral Q4H PRN Purohit, Shrey C, MD   100 mg at 02/24/18 1309  . guaiFENesin (ROBITUSSIN) 100 MG/5ML solution 300 mg  15 mL Per Tube BID Irene Pap N, DO   300 mg at 03/01/18 0847  . heparin lock flush 100 unit/mL  500 Units Intravenous Once Velvet Bathe, MD      . hydrochlorothiazide (HYDRODIURIL) tablet 12.5 mg  12.5 mg Per Tube Daily Purohit, Shrey C, MD   12.5 mg at 03/01/18 0848  . HYDROcodone-acetaminophen (HYCET) 7.5-325 mg/15 ml solution 10 mL  10 mL Oral QID PRN Purohit, Shrey C, MD      . insulin aspart (novoLOG) injection 0-20 Units  0-20 Units Subcutaneous TID WC Irene Pap N, DO   15 Units at 03/01/18 1306  . insulin aspart (novoLOG) injection 0-5 Units  0-5 Units Subcutaneous QHS Kayleen Memos, DO   3 Units at 02/28/18 2115  . ipratropium-albuterol (DUONEB) 0.5-2.5 (3) MG/3ML nebulizer solution 3 mL  3 mL Nebulization BID Velvet Bathe, MD   3 mL at 03/01/18 0746  . LORazepam (ATIVAN) tablet 0.5 mg  0.5 mg Oral Q6H PRN Purohit, Shrey C, MD   0.5 mg at 03/01/18 0846  . magic mouthwash w/lidocaine  1 mL Oral TID Irene Pap N, DO   1 mL at 03/01/18 1115  . morphine 4 MG/ML injection 4 mg  4 mg Intravenous Q4H PRN Purohit, Shrey C, MD   4 mg at 02/28/18 2115  . neomycin-bacitracin-polymyxin (NEOSPORIN) ointment   Topical Daily [name], MD      . ondansetron  [facility]) tablet 4 mg  4 mg Oral Q6H PRN Purohit, Konrad Dolores, MD       Or  . ondansetron (ZOFRAN) injection 4 mg  4 mg Intravenous Q6H PRN Purohit, Konrad Dolores, MD   4 mg at 02/21/18 2117  . piperacillin-tazobactam (ZOSYN) IVPB 3.375 g  3.375 g Intravenous Q8H Thomes Lolling, [facility]   Stopped at 03/01/18 1251  . polyethylene glycol (MIRALAX / GLYCOLAX) packet 17 g  17 g Oral Daily PRN Purohit, Shrey C, MD      . polyvinyl alcohol (LIQUIFILM TEARS) 1.4 % ophthalmic solution 1 drop  1 drop Both Eyes PRN Purohit, Shrey C, MD      . pravastatin (PRAVACHOL) tablet 40 mg  40 mg Per Tube Daily Purohit, Shrey C, MD   40 mg at 03/01/18 0847  . sodium chloride flush (NS) 0.9 % injection 10-40 mL  10-40 mL Intracatheter PRN Arrien, Jimmy Picket, MD      . vitamin B-12 (CYANOCOBALAMIN) tablet 500 mcg  500 mcg Per Tube BID Cristy Folks, MD   500 mcg at 03/01/18 [phone]  Review of Systems: Review of Systems  HENT:   Positive for mouth sores, sore throat and voice change.   Neurological: Positive for dizziness.  All other systems reviewed and are negative.    PHYSICAL EXAMINATION Blood pressure 130/78, pulse 87, temperature 98.7 F (37.1 C), temperature source Oral, resp. rate 18, height 5' 9"  (1.753 m), weight 201 lb 11.2 oz (91.5 kg), SpO2 97 %.  ECOG PERFORMANCE STATUS: 1 - Symptomatic but completely ambulatory  Physical Exam  Constitutional: He is oriented to person, place, and time. He appears well-developed and well-nourished. No distress.  HENT:  Head: Normocephalic and atraumatic.  Mouth/Throat: Oropharynx is clear and moist. No oropharyngeal exudate.  New fibrinous ulcer located at the posterolateral left tongue.  No active bleeding.  Eyes: Pupils are equal, round, and reactive to light. Conjunctivae and EOM are normal. No scleral icterus.  Neck: No thyromegaly present.  Cardiovascular: Normal rate, regular rhythm, normal heart sounds and intact distal pulses.  No murmur  heard. Pulmonary/Chest: Effort normal and breath sounds normal. No stridor. No respiratory distress. He has no wheezes. He has no rales.  Abdominal: Soft. Bowel sounds are normal. He exhibits no distension and no mass. There is no tenderness. There is no guarding.  Musculoskeletal: He exhibits no edema.  Lymphadenopathy:    He has cervical adenopathy.  Neurological: He is alert and oriented to person, place, and time. He displays normal reflexes. No cranial nerve deficit or sensory deficit.  Skin: Skin is warm and dry. No rash noted. He is not diaphoretic. No erythema. No pallor.     LABORATORY DATA: I have personally reviewed the data as listed: Appointment on 02/08/2018  Component Date Value Ref Range Status  . WBC Count 02/08/2018 3.2* 4.0 - 10.3 K/uL Final  . RBC 02/08/2018 4.19* 4.20 - 5.82 MIL/uL Final  . Hemoglobin 02/08/2018 12.1* 13.0 - 17.1 g/dL Final  . HCT 02/08/2018 36.2* 38.4 - 49.9 % Final  . MCV 02/08/2018 86.4  79.3 - 98.0 fL Final  . MCH 02/08/2018 28.9  27.2 - 33.4 pg Final  . MCHC 02/08/2018 33.4  32.0 - 36.0 g/dL Final  . RDW 02/08/2018 13.5  11.0 - 14.6 % Final  . Platelet Count 02/08/2018 162  140 - 400 K/uL Final  . Neutrophils Relative % 02/08/2018 70  % Final  . Neutro Abs 02/08/2018 2.3  1.5 - 6.5 K/uL Final  . Lymphocytes Relative 02/08/2018 14  % Final  . Lymphs Abs 02/08/2018 0.4* 0.9 - 3.3 K/uL Final  . Monocytes Relative 02/08/2018 15  % Final  . Monocytes Absolute 02/08/2018 0.5  0.1 - 0.9 K/uL Final  . Eosinophils Relative 02/08/2018 0  % Final  . Eosinophils Absolute 02/08/2018 0.0  0.0 - 0.5 K/uL Final  . Basophils Relative 02/08/2018 1  % Final  . Basophils Absolute 02/08/2018 0.0  0.0 - 0.1 K/uL Final   Performed at [facility], [place] [address], [place], [place] [address]  . Sodium 02/08/2018 136  136 - 145 mmol/L Final  . Potassium 02/08/2018 3.6  3.5 - 5.1 mmol/L Final  . Chloride 02/08/2018 97* 98 - 109 mmol/L Final   . CO2 02/08/2018 30* 22 - 29 mmol/L Final  . Glucose, Bld 02/08/2018 131  70 - 140 mg/dL Final  . BUN 02/08/2018 13  7 - 26 mg/dL Final  . Creatinine 02/08/2018 0.99  0.70 - 1.30 mg/dL Final  . Calcium 02/08/2018 9.6  8.4 - 10.4 mg/dL Final  .  Total Protein 02/08/2018 7.1  6.4 - 8.3 g/dL Final  . Albumin 02/08/2018 3.2* 3.5 - 5.0 g/dL Final  . AST 02/08/2018 19  5 - 34 U/L Final  . ALT 02/08/2018 25  0 - 55 U/L Final  . Alkaline Phosphatase 02/08/2018 74  40 - 150 U/L Final  . Total Bilirubin 02/08/2018 0.5  0.2 - 1.2 mg/dL Final  . GFR, Est Non Af Am 02/08/2018 >60  >60 mL/min Final  . GFR, Est AFR Am 02/08/2018 >60  >60 mL/min Final   Comment: (NOTE) The eGFR has been calculated using the CKD EPI equation. This calculation has not been validated in all clinical situations. eGFR's persistently <60 mL/min signify possible Chronic Kidney Disease.   Georgiann Hahn gap 02/08/2018 9  3 - 11 Final   Performed at [facility], [place] [address], [place], [place] [address]  . Magnesium 02/08/2018 1.7  1.7 - 2.4 mg/dL Final   Performed at [facility], [place] [address] [place], [place] [address]  . Phosphorus 02/08/2018 3.0  2.5 - 4.6 mg/dL Final   Performed at [facility] [address] [place],  [address]  Admission on 02/05/2018, Discharged on 02/05/2018  Component Date Value Ref Range Status  . WBC 02/05/2018 QUESTIONABLE RESULTS, RECOMMEND RECOLLECT TO VERIFY  4.0 - 10.5 K/uL Corrected   Comment: INFORMED IRENE, RN @0530  ON 4.30.19 BY [facility] CORRECTED ON 04/30 AT 0545: PREVIOUSLY REPORTED AS 6.5   . RBC 02/05/2018 QUESTIONABLE RESULTS, RECOMMEND RECOLLECT TO VERIFY  4.22 - 5.81 MIL/uL Corrected   CORRECTED ON 04/30 AT 0545: PREVIOUSLY REPORTED AS 2.59  . Hemoglobin 02/05/2018 QUESTIONABLE RESULTS, RECOMMEND RECOLLECT TO VERIFY  13.0 - 17.0 g/dL Corrected   CORRECTED ON 04/30 AT 0545: PREVIOUSLY REPORTED AS 7.5  .  HCT 02/05/2018 QUESTIONABLE RESULTS, RECOMMEND RECOLLECT TO VERIFY  39.0 - 52.0 % Corrected   CORRECTED ON 04/30 AT 0545: PREVIOUSLY REPORTED AS 22.4  . MCV 02/05/2018 QUESTIONABLE RESULTS, RECOMMEND RECOLLECT TO VERIFY  78.0 - 100.0 fL Corrected   CORRECTED ON 04/30 AT 0545: PREVIOUSLY REPORTED AS 86.5  . [facility] 02/05/2018 QUESTIONABLE RESULTS, RECOMMEND RECOLLECT TO VERIFY  26.0 - 34.0 pg Corrected   CORRECTED ON 04/30 AT 0545: PREVIOUSLY REPORTED AS 29.0  . MCHC 02/05/2018 QUESTIONABLE RESULTS, RECOMMEND RECOLLECT TO VERIFY  30.0 - 36.0 g/dL Corrected   CORRECTED ON 04/30 AT 0545: PREVIOUSLY REPORTED AS 33.5  . RDW 02/05/2018 QUESTIONABLE RESULTS, RECOMMEND RECOLLECT TO VERIFY  11.5 - 15.5 % Corrected   CORRECTED ON 04/30 AT 0545: PREVIOUSLY REPORTED AS 13.5  . Platelets 02/05/2018 QUESTIONABLE RESULTS, RECOMMEND RECOLLECT TO VERIFY  150 - 400 K/uL Corrected   CORRECTED ON 04/30 AT 0545: PREVIOUSLY REPORTED AS 215  . Neutrophils Relative % 02/05/2018 QUESTIONABLE RESULTS, RECOMMEND RECOLLECT TO VERIFY  % Corrected   CORRECTED ON 04/30 AT 0545: PREVIOUSLY REPORTED AS 81  . Neutro Abs 02/05/2018 QUESTIONABLE RESULTS, RECOMMEND RECOLLECT TO VERIFY  1.7 - 7.7 K/uL Corrected   CORRECTED ON 04/30 AT 0545: PREVIOUSLY REPORTED AS 5.3  . Lymphocytes Relative 02/05/2018 QUESTIONABLE RESULTS, RECOMMEND RECOLLECT TO VERIFY  % Corrected   CORRECTED ON 04/30 AT 0545: PREVIOUSLY REPORTED AS 12  . Lymphs Abs 02/05/2018 QUESTIONABLE RESULTS, RECOMMEND RECOLLECT TO VERIFY  0.7 - 4.0 K/uL Corrected   CORRECTED ON 04/30 AT 0545: PREVIOUSLY REPORTED AS 0.8  . Monocytes Relative 02/05/2018 QUESTIONABLE RESULTS, RECOMMEND RECOLLECT TO VERIFY  % Corrected   CORRECTED ON 04/30 AT  0545: PREVIOUSLY REPORTED AS 7  . Monocytes Absolute 02/05/2018 QUESTIONABLE RESULTS, RECOMMEND RECOLLECT TO VERIFY  0.1 - 1.0 K/uL Corrected   CORRECTED ON 04/30 AT 0545: PREVIOUSLY REPORTED AS 0.4  . Eosinophils Relative 02/05/2018  QUESTIONABLE RESULTS, RECOMMEND RECOLLECT TO VERIFY  % Corrected   CORRECTED ON 04/30 AT 0545: PREVIOUSLY REPORTED AS 0  . Eosinophils Absolute 02/05/2018 QUESTIONABLE RESULTS, RECOMMEND RECOLLECT TO VERIFY  0.0 - 0.7 K/uL Corrected   CORRECTED ON 04/30 AT 0545: PREVIOUSLY REPORTED AS 0.0  . Basophils Relative 02/05/2018 QUESTIONABLE RESULTS, RECOMMEND RECOLLECT TO VERIFY  % Corrected   CORRECTED ON 04/30 AT 0545: PREVIOUSLY REPORTED AS 0  . Basophils Absolute 02/05/2018 QUESTIONABLE RESULTS, RECOMMEND RECOLLECT TO VERIFY  0.0 - 0.1 K/uL Corrected   Comment: Performed at [facility], [place] [address] [place], [place] [address] CORRECTED ON 04/30 AT 0545: PREVIOUSLY REPORTED AS 0.0   . Sodium 02/05/2018 QUESTIONABLE RESULTS, RECOMMEND RECOLLECT TO VERIFY  135 - 145 mmol/L Corrected   Comment: INFORMED IRENE, RN @0530  ON 4.30.19 BY [facility] CORRECTED ON 04/30 AT 0546: PREVIOUSLY REPORTED AS 138   . Potassium 02/05/2018 QUESTIONABLE RESULTS, RECOMMEND RECOLLECT TO VERIFY  3.5 - 5.1 mmol/L Corrected   Comment: INFORMED IRENE, RN @0530  ON 4.30.19 BY [facility] CORRECTED ON 04/30 AT 0546: PREVIOUSLY REPORTED AS 3.3   . Chloride 02/05/2018 QUESTIONABLE RESULTS, RECOMMEND RECOLLECT TO VERIFY  101 - 111 mmol/L Corrected   Comment: INFORMED IRENE, RN @0530  ON 4.30.19 BY [facility] CORRECTED ON 04/30 AT 0546: PREVIOUSLY REPORTED AS 101   . CO2 02/05/2018 QUESTIONABLE RESULTS, RECOMMEND RECOLLECT TO VERIFY  22 - 32 mmol/L Corrected   Comment: INFORMED IRENE, RN @0530  ON 4.30.19 BY [facility] CORRECTED ON 04/30 AT 0546: PREVIOUSLY REPORTED AS 27   . Glucose, Bld 02/05/2018 QUESTIONABLE RESULTS, RECOMMEND RECOLLECT TO VERIFY  65 - 99 mg/dL Corrected   Comment: INFORMED IRENE, RN @0530  ON 4.30.19 BY [facility] CORRECTED ON 04/30 AT 0546: PREVIOUSLY REPORTED AS 118   . BUN 02/05/2018 QUESTIONABLE RESULTS, RECOMMEND RECOLLECT TO VERIFY  6 - 20 mg/dL Corrected   Comment: INFORMED IRENE, RN @0530  ON  4.30.19 BY [facility] CORRECTED ON 04/30 AT 0546: PREVIOUSLY REPORTED AS 14   . Creatinine, Ser 02/05/2018 QUESTIONABLE RESULTS, RECOMMEND RECOLLECT TO VERIFY  0.61 - 1.24 mg/dL Corrected   Comment: INFORMED IRENE, RN @0530  ON 4.30.19 BY [facility] CORRECTED ON 04/30 AT 0546: PREVIOUSLY REPORTED AS 0.79   . Calcium 02/05/2018 QUESTIONABLE RESULTS, RECOMMEND RECOLLECT TO VERIFY  8.9 - 10.3 mg/dL Corrected   Comment: INFORMED IRENE, RN @0530  ON 4.30.19 BY [facility] CORRECTED ON 04/30 AT 0546: PREVIOUSLY REPORTED AS 8.7   . Total Protein 02/05/2018 QUESTIONABLE RESULTS, RECOMMEND RECOLLECT TO VERIFY  6.5 - 8.1 g/dL Corrected   Comment: INFORMED IRENE, RN @0530  ON 4.30.19 BY [facility] CORRECTED ON 04/30 AT 0546: PREVIOUSLY REPORTED AS 6.5   . Albumin 02/05/2018 QUESTIONABLE RESULTS, RECOMMEND RECOLLECT TO VERIFY  3.5 - 5.0 g/dL Corrected   Comment: INFORMED IRENE, RN @0530  ON 4.30.19 BY [facility] CORRECTED ON 04/30 AT 0546: PREVIOUSLY REPORTED AS 3.1   . AST 02/05/2018 QUESTIONABLE RESULTS, RECOMMEND RECOLLECT TO VERIFY  15 - 41 U/L Corrected   Comment: INFORMED IRENE, RN @0530  ON 4.30.19 BY [facility] CORRECTED ON 04/30 AT 0546: PREVIOUSLY REPORTED AS 14   . ALT 02/05/2018 QUESTIONABLE RESULTS, RECOMMEND RECOLLECT TO VERIFY  17 - 63 U/L Corrected   Comment: INFORMED IRENE, RN @0530  ON 4.30.19 BY [facility] CORRECTED ON 04/30 AT 0546: PREVIOUSLY REPORTED  AS 20   . Alkaline Phosphatase 02/05/2018 QUESTIONABLE RESULTS, RECOMMEND RECOLLECT TO VERIFY  38 - 126 U/L Corrected   Comment: INFORMED IRENE, RN @0530  ON 4.30.19 BY [facility] CORRECTED ON 04/30 AT 0546: PREVIOUSLY REPORTED AS 59   . Total Bilirubin 02/05/2018 QUESTIONABLE RESULTS, RECOMMEND RECOLLECT TO VERIFY  0.3 - 1.2 mg/dL Corrected   Comment: INFORMED IRENE, RN @0530  ON 4.30.19 BY [facility] CORRECTED ON 04/30 AT 0546: PREVIOUSLY REPORTED AS 1.4 RESULTS CONFIRMED BY MANUAL DILUTION   . GFR calc non Af Amer 02/05/2018 QUESTIONABLE RESULTS, RECOMMEND RECOLLECT  TO VERIFY  >60 mL/min Corrected   Comment: INFORMED IRENE, RN @0530  ON 4.30.19 BY [facility] CORRECTED ON 04/30 AT 0546: PREVIOUSLY REPORTED AS >60   . GFR calc Af Amer 02/05/2018 QUESTIONABLE RESULTS, RECOMMEND RECOLLECT TO VERIFY  >60 mL/min Corrected   Comment: INFORMED IRENE, RN @0530  ON 4.30.19 BY [facility] CORRECTED ON 04/30 AT 0546: PREVIOUSLY REPORTED AS >60   . Anion gap 02/05/2018 QUESTIONABLE RESULTS, RECOMMEND RECOLLECT TO VERIFY  5 - 15 Corrected   Comment: Henry Russel, RN @0530  ON 4.30.19 BY [facility] Performed at [facility], [place] [address] [place], [place] [address] CORRECTED ON 04/30 AT 0546: PREVIOUSLY REPORTED AS 10   . Color, Urine 02/05/2018 YELLOW  YELLOW Final  . APPearance 02/05/2018 CLEAR  CLEAR Final  . Specific Gravity, Urine 02/05/2018 1.017  1.005 - 1.030 Final  . pH 02/05/2018 5.0  5.0 - 8.0 Final  . Glucose, UA 02/05/2018 NEGATIVE  NEGATIVE mg/dL Final  . Hgb urine dipstick 02/05/2018 SMALL* NEGATIVE Final  . Bilirubin Urine 02/05/2018 NEGATIVE  NEGATIVE Final  . Ketones, ur 02/05/2018 NEGATIVE  NEGATIVE mg/dL Final  . Protein, ur 02/05/2018 NEGATIVE  NEGATIVE mg/dL Final  . Nitrite 02/05/2018 NEGATIVE  NEGATIVE Final  . Leukocytes, UA 02/05/2018 NEGATIVE  NEGATIVE Final  . RBC / HPF 02/05/2018 6-10  0 - 5 RBC/hpf Final  . WBC, UA 02/05/2018 0-5  0 - 5 WBC/hpf Final  . Bacteria, UA 02/05/2018 NONE SEEN  NONE SEEN Final  . Squamous Epithelial / LPF 02/05/2018 0-5  0 - 5 Final   Please note change in reference range.  . Mucus 02/05/2018 PRESENT   Final   Performed at [facility], [place] [address] [place], [place] [address]  . Specimen Description 02/05/2018    Final                   Value:BLOOD RIGHT ANTECUBITAL Performed at [facility], [place] [address], [place], [place] [address]   . Special Requests 02/05/2018    Final                   Value:BOTTLES DRAWN AEROBIC AND ANAEROBIC Blood Culture  results may not be optimal due to an excessive volume of blood received in culture bottles Performed at [facility], [place] [address] [place], [place] [address]   . Culture 02/05/2018    Final                   Value:NO GROWTH 5 DAYS Performed at [facility], [place] [address], [place],  [address]   . Report Status 02/05/2018 02/10/2018 FINAL   Final  . Specimen Description 02/05/2018    Final                   Value:URINE, RANDOM Performed at [facility], [place] [address] [place], [place]  [address]   . Special Requests 02/05/2018    Final                   Value:NONE Performed at [facility], [place] [address], [place], [place] [address]   . Culture 02/05/2018 *  Final                   Value:<10,000 COLONIES/mL INSIGNIFICANT GROWTH Performed at [place] [address] [place], [place] [address]   . Report Status 02/05/2018 02/06/2018 FINAL   Final  . ABO/RH(D) 02/05/2018 A POS   Final  . Antibody Screen 02/05/2018 NEG   Final  . Sample Expiration 02/05/2018 02/04/2018   Final  . WBC 02/05/2018 4.2  4.0 - 10.5 K/uL Final  . RBC 02/05/2018 3.74* 4.22 - 5.81 MIL/uL Final  . Hemoglobin 02/05/2018 10.8* 13.0 - 17.0 g/dL Final  . HCT 02/05/2018 32.3* 39.0 - 52.0 % Final  . MCV 02/05/2018 86.4  78.0 - 100.0 fL Final  . MCH 02/05/2018 28.9  26.0 - 34.0 pg Final  . MCHC 02/05/2018 33.4  30.0 - 36.0 g/dL Final  . RDW 02/05/2018 13.3  11.5 - 15.5 % Final  . Platelets 02/05/2018 142* 150 - 400 K/uL Final   Performed at [facility], [place] [address], [place], [place] [address]  . Sodium 02/05/2018 137  135 - 145 mmol/L Final  . Potassium 02/05/2018 3.3* 3.5 - 5.1 mmol/L Final  . Chloride 02/05/2018 100* 101 - 111 mmol/L Final  . CO2 02/05/2018 26  22 - 32 mmol/L Final  . Glucose, Bld 02/05/2018 114* 65 - 99 mg/dL Final  . BUN 02/05/2018 12  6 - 20 mg/dL Final  . Creatinine, Ser 02/05/2018 0.74   0.61 - 1.24 mg/dL Final  . Calcium 02/05/2018 8.3* 8.9 - 10.3 mg/dL Final  . Total Protein 02/05/2018 5.9* 6.5 - 8.1 g/dL Final  . Albumin 02/05/2018 2.8* 3.5 - 5.0 g/dL Final  . AST 02/05/2018 15  15 - 41 U/L Final  . ALT 02/05/2018 19  17 - 63 U/L Final  . Alkaline Phosphatase 02/05/2018 54  38 - 126 U/L Final  . Total Bilirubin 02/05/2018 0.6  0.3 - 1.2 mg/dL Final  . GFR calc non Af Amer 02/05/2018 >60  >60 mL/min Final  . GFR calc Af Amer 02/05/2018 >60  >60 mL/min Final   Comment: (NOTE) The eGFR has been calculated using the CKD EPI equation. This calculation has not been validated in all clinical situations. eGFR's persistently <60 mL/min signify possible Chronic Kidney Disease.   Georgiann Hahn gap 02/05/2018 11  5 - 15 Final   Performed at [facility], [place] [address] [place], [place] [address]  . Neutrophils Relative % 02/05/2018 80  % Final  . Neutro Abs 02/05/2018 3.4  1.7 - 7.7 K/uL Final  . Lymphocytes Relative 02/05/2018 13  % Final  . Lymphs Abs 02/05/2018 0.5* 0.7 - 4.0 K/uL Final  . Monocytes Relative 02/05/2018 7  % Final  . Monocytes Absolute 02/05/2018 0.3  0.1 - 1.0 K/uL Final  . Eosinophils Relative 02/05/2018 0  % Final  . Eosinophils Absolute 02/05/2018 0.0  0.0 - 0.7 K/uL Final  . Basophils Relative 02/05/2018 0  % Final  . Basophils Absolute 02/05/2018 0.0  0.0 - 0.1 K/uL Final   Performed at [facility], [place] [address], [place],  [address]  . ABO/RH(D) 02/05/2018 A POS  Final  . Antibody Screen 02/05/2018 NEG   Final  . Sample Expiration 02/05/2018    Final                   Value:02/08/2018 Performed at [facility], [place] [address], [place], [place] [address]   . ABO/RH(D) 02/05/2018    Final                   Value:A POS Performed at [facility], [place] [address] [place], [place] [address]        Ardath Sax, MD

## 2018-03-01 NOTE — Discharge Summary (Signed)
Physician Discharge Summary  Edgar Herrera URK:[id number] DOB: 1946-07-07 DOA: 02/21/2018  PCP: [facility]  Admit date: 02/21/2018 Discharge date: 03/01/2018  Time spent: 35  minutes  Recommendations for Outpatient Follow-up:  1.    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  Filed Weights   02/26/18 0551 02/27/18 0615 02/28/18 0521  Weight: 92.6 kg (204 lb 1.6 oz) 93.3 kg (205 lb 11 oz) 96 kg (211 lb 10.3 oz)    History of present illness:   72 y.o. male with medical history significant of paroxysmal atrial fibrillation, type 2 diabetes on oral hypoglycemics, hyperlipidemia, hypertension, recent diagnosis of squamous cell cancer of throat coming in with fever without a source  Hospital Course:  1. Radiation induced dermatitis/ stomatitis  - Continue Pain control with analgesics and topical neomycin.   2. Neutropenic fever. Pt cleared for d/c by his oncologist.   3. Oropharyngeal candidiasis. Treated for 7 days. F/u with oncologist after hospital discharge.  4. Head and neck squamous cell carcinoma. Continue nutrition per peg tube with good toleration.   5. AKI. resolved  6. HTN. Continue blood pressure medication listed below   7. T2DM. Continue prior to admission medication regimen  8. Atrial fibrillation. Rate controlled, continue anticoagulation with apixaban.    Procedures:  none  Consultations:  Oncology  ID  Discharge Exam: Vitals:   03/01/18 0746 03/01/18 1259  BP:  (!) 116/59  Pulse:  (!) 55  Resp:  16  Temp:  98.6 F (37 C)  SpO2: 93% 95%    General: Pt  in nad, alert and awake Cardiovascular: rrr, no rubs Respiratory: no increased wob, no wheezes  Discharge Instructions   Discharge Instructions    Call MD for:  severe uncontrolled pain   Complete by:  As directed    Call MD for:  temperature >100.4   Complete by:  As directed    Diet - low sodium heart healthy   Complete by:  As directed    Discharge instructions   Complete by:  As directed    Please be sure to follow up with you oncologist after hospital discharge.   Increase activity slowly   Complete by:  As directed      Allergies as of 03/01/2018   No Known Allergies     Medication List    STOP taking these medications   amoxicillin 125 MG/5ML suspension Commonly known as:  AMOXIL   cephALEXin 500 MG capsule Commonly known as:  KEFLEX   hydrochlorothiazide 25 MG tablet Commonly known as:  HYDRODIURIL   lisinopril 40 MG tablet Commonly known as:  PRINIVIL,ZESTRIL   morphine CONCENTRATE 10 mg / 0.5 ml concentrated solution   sulfamethoxazole-trimethoprim 200-40 MG/5ML suspension Commonly known as:  BACTRIM,SEPTRA     TAKE these medications   allopurinol 100 MG tablet Commonly known as:  ZYLOPRIM Take 100 mg by mouth daily.   amLODipine 10 MG tablet Commonly known as:  NORVASC Take 10 mg by mouth daily.   apixaban 5 MG Tabs tablet Commonly known as:  ELIQUIS Take 5 mg by mouth 2 (two) times  daily.   dexamethasone 4 MG tablet Commonly known as:  DECADRON Take 2 tablets (8 mg total) by mouth daily. Start the day after chemotherapy for 2 days.   glipiZIDE 5 MG tablet Commonly known as:  GLUCOTROL Take 2.5-5 mg by mouth See admin instructions. Take 1 tablet before breakfast and Take 1/2 tablet before dinner.   HYDROcodone-acetaminophen 7.5-325 mg/15 ml solution Commonly known as:  HYCET Take 10 mLs by mouth 4 (four) times daily as needed for moderate pain.   lidocaine 2 % solution Commonly known as:  XYLOCAINE Patient: Mix 1part 2% viscous  lidocaine, 1part H20. Swish & swallow 33mL of diluted mixture, 36min before meals and at bedtime, up to QID   lidocaine-prilocaine cream Commonly known as:  EMLA Apply to affected area once What changed:    how much to take  how to take this  when to take this  reasons to take this  additional instructions   LORazepam 0.5 MG tablet Commonly known as:  ATIVAN Take 1 tablet (0.5 mg total) by mouth every 6 (six) hours as needed (Nausea or vomiting). What changed:  reasons to take this   metFORMIN 1000 MG tablet Commonly known as:  GLUCOPHAGE Take 1,000 mg by mouth 2 (two) times daily with a meal.   ondansetron 8 MG tablet Commonly known as:  ZOFRAN Take 1 tablet (8 mg total) by mouth 2 (two) times daily as needed for refractory nausea / vomiting. Start on day 3 after chemo.   pravastatin 40 MG tablet Commonly known as:  PRAVACHOL Take 40 mg by mouth daily.   prochlorperazine 10 MG tablet Commonly known as:  COMPAZINE Take 1 tablet (10 mg total) by mouth every 6 (six) hours as needed (Nausea or vomiting).   sodium fluoride 1.1 % Gel dental gel Commonly known as:  FLUORISHIELD Instill one drop of gel per tooth space of fluoride tray. Place over teeth for 5 minutes. Remove. Spit out excess. Repeat nightly. What changed:    how much to take  how to take this  when to take this  additional instructions   SYSTANE CONTACTS Soln Place 1 drop into both eyes daily.   vitamin B-12 500 MCG tablet Commonly known as:  CYANOCOBALAMIN Take 500 mcg by mouth 2 (two) times daily.      No Known Allergies    The results of significant diagnostics from this hospitalization (including imaging, microbiology, ancillary and laboratory) are listed below for reference.    Significant Diagnostic Studies: Ct Soft Tissue Neck W Contrast  Result Date: 02/25/2018 CLINICAL DATA:  Head neck squamous cell carcinoma. Difficulty swallowing. EXAM: CT NECK WITH CONTRAST TECHNIQUE:  Multidetector CT imaging of the neck was performed using the standard protocol following the bolus administration of intravenous contrast. CONTRAST:  69mL OMNIPAQUE IOHEXOL 300 MG/ML  SOLN COMPARISON:  CT neck 11/29/2017 FINDINGS: PHARYNX AND LARYNX: --Nasopharynx: Fossae of Rosenmuller are clear. Normal adenoid tonsils for age. --Oral cavity and oropharynx: The palatine and lingual tonsils are normal. The visible oral cavity and floor of mouth are normal. --Hypopharynx: Previously demonstrated mass within the left vallecula and encroaching on the left parapharyngeal space is no longer present. There is no residual mass visualized. --Larynx: Normal epiglottis and pre-epiglottic space. Normal aryepiglottic and vocal folds. --Retropharyngeal space: No abscess, effusion or lymphadenopathy. SALIVARY GLANDS: --Parotid: No mass lesion or inflammation. No sialolithiasis or ductal dilatation. --Submandibular: Symmetric without inflammation. No sialolithiasis or ductal dilatation. --Sublingual: Normal. No ranula or other visible lesion of the  base of tongue and floor of mouth. THYROID: Normal. LYMPH NODES: Hypodense left level 2 A node measures 9 mm. A slightly lower left level 2A node measures 10 mm. These nodes have decreased in size. Unchanged appearance of subcentimeter left level 3 nodes. No right-sided adenopathy. VASCULAR: Calcific atherosclerosis of both carotid bifurcations without high-grade stenosis. LIMITED INTRACRANIAL: Normal. VISUALIZED ORBITS: Normal. MASTOIDS AND VISUALIZED PARANASAL SINUSES: No fluid levels or advanced mucosal thickening. No mastoid effusion. SKELETON: No bony spinal canal stenosis. No lytic or blastic lesions. UPPER CHEST: Clear. OTHER: None. IMPRESSION: 1. No residual pharyngeal mass is visible. No specific finding to account for the reported dysphagia. 2. Decreased size of left level 2 A cervical lymph nodes, likely indicating response to therapy. Electronically Signed   By: Ulyses Jarred M.D.   On: 02/25/2018 17:35   Dg Chest Portable 1 View  Result Date: 02/21/2018 CLINICAL DATA:  Throat cancer.  Coughing up red phlegm. EXAM: PORTABLE CHEST 1 VIEW COMPARISON:  Outside PET-CT 12/24/2017. FINDINGS: Left subclavian PowerPort catheter noted with tip in the right atrium. Cardiomegaly with normal pulmonary vascularity. No focal infiltrate. No pleural effusion pneumothorax. Mild elevation left hemidiaphragm. No acute bony abnormality. Mediastinum and hilar structures are normal. IMPRESSION: One PowerPort catheter noted with tip over the right atrium. 2.  Cardiomegaly.  No pulmonary venous congestion. 3.  No acute pulmonary disease. Electronically Signed   By: Marcello Moores  Register   On: 02/21/2018 07:41    Microbiology: Recent Results (from the past 240 hour(s))  Blood Culture (routine x 2)     Status: None   Collection Time: 02/21/18  6:43 AM  Result Value Ref [place] Status   Specimen Description   Final    BLOOD LEFT ANTECUBITAL Performed at [facility] [address] [place], [place] [address]    Special Requests   Final    BOTTLES DRAWN AEROBIC AND ANAEROBIC Blood Culture adequate volume Performed at [place] [address] [place], [place] [address]    Culture   Final    NO GROWTH 5 DAYS Performed at [facility], [place] [address] [place], [place] [address]    Report Status 02/26/2018 FINAL  Final  Blood Culture (routine x 2)     Status: None   Collection Time: 02/21/18  8:12 AM  Result Value Ref Range Status   Specimen Description   Final    BLOOD RIGHT ANTECUBITAL Performed at [place] [address] [place], [place] [address]    Special Requests   Final    BOTTLES DRAWN AEROBIC AND ANAEROBIC Blood Culture results may not be optimal due to an excessive volume of blood received in culture bottles Performed at [place] [address], [place], [place] [address]     Culture   Final    NO GROWTH 5 DAYS Performed at [facility], [place] [address] [place], [place] [address]    Report Status 02/26/2018 FINAL  Final  Culture, blood (x 2)     Status: None   Collection Time: 02/24/18 10:05 AM  Result Value Ref [place] Status   Specimen Description   Final    BLOOD RIGHT ANTECUBITAL Performed at [facility] [address], [place], [place] [address]    Special Requests   Final    BOTTLES DRAWN AEROBIC AND ANAEROBIC Blood Culture adequate volume Performed at [place] [address] [place], [place] [address]  Culture   Final    NO GROWTH 5 DAYS Performed at [facility], [place] [address], [place], [place] [address]    Report Status 03/01/2018 FINAL  Final  Culture, blood (x 2)     Status: None   Collection Time: 02/24/18 10:06 AM  Result Value Ref [place] Status   Specimen Description   Final    BLOOD LEFT ANTECUBITAL Performed at [facility] [address], [place], [place] [address]    Special Requests   Final    BOTTLES DRAWN AEROBIC ONLY Blood Culture adequate volume Performed at [facility] [place] [address], [place], [place] [address]    Culture   Final    NO GROWTH 5 DAYS Performed at [facility], [place] [address] [place], [place] [address]    Report Status 03/01/2018 FINAL  Final     Labs: Basic Metabolic Panel: Recent Labs  Lab 02/24/18 0500 02/24/18 1222 02/25/18 0444 02/26/18 0533 02/27/18 0500 02/28/18 0403  NA  --   --  135  --  135 136  K  --   --  3.4*  --  3.9 3.9  CL  --   --  97*  --  94* 93*  CO2  --   --  27  --  31 31  GLUCOSE  --  391* 254*  --  271* 261*  BUN  --   --  24*  --  22* 23*  CREATININE 0.76  --  0.71 0.72 0.74 0.80  CALCIUM  --   --  8.1*  --  8.3* 8.3*  MG  --   --   --   --  1.7  --    Liver Function Tests: Recent Labs  Lab 02/25/18 0444  AST 46*  ALT 96*  ALKPHOS 59  BILITOT 0.5  PROT  6.0*  ALBUMIN 2.4*   No results for input(s): LIPASE, AMYLASE in the last 168 hours. No results for input(s): AMMONIA in the last 168 hours. CBC: Recent Labs  Lab 02/25/18 0444 02/26/18 0533 02/27/18 0500 02/28/18 0403 03/01/18 0500  WBC 1.3* 2.3* 2.0* 3.9* 6.1  NEUTROABS 0.8* 1.8 1.5* 3.3 5.4  HGB 9.0* 7.9* 8.0* 7.6* 7.7*  HCT 25.9* 23.9* 24.2* 23.7* 23.7*  MCV 85.8 87.9 87.4 88.1 88.8  PLT 108* 120* 120* 121* 137*   Cardiac Enzymes: No results for input(s): CKTOTAL, CKMB, CKMBINDEX, TROPONINI in the last 168 hours. BNP: BNP (last 3 results) No results for input(s): BNP in the last 8760 hours.  ProBNP (last 3 results) No results for input(s): PROBNP in the last 8760 hours.  CBG: Recent Labs  Lab 02/28/18 1229 02/28/18 1655 02/28/18 2049 03/01/18 0746 03/01/18 1135  GLUCAP 325* 195* 261* 224* 305*    Signed:  Velvet Bathe MD.  [facility] 03/01/2018, 2:05 PM

## 2018-03-01 NOTE — Assessment & Plan Note (Signed)
72 y.o.  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  Agree with the current clinical stage II based on clinical T3, clinical N1 disease.  Based on the age and comorbidities, patient is not a candidate for cisplatin chemotherapy, but will likely be able to tolerate concurrent weekly carboplatin/paclitaxel combination administered with radiotherapy.  Patient presents to the clinic to continue systemic therapy therapy.  Appears to be tolerating infusion well so far.  Increasing amount of pain consistent with progressive mucositis from radiation.  Clinical evaluation lab work-up permissive to proceed with the next cycle of systemic chemotherapy.  Plan: -Proceed with systemic therapy with carboplatin/paclitaxel, week #4. -Start liquid morphine for pain control -Return to clinic in 1 week: Labs, possible next dose of systemic chemotherapy.

## 2018-03-01 NOTE — Consult Note (Signed)
IP PROGRESS NOTE  Subjective:  Low-grade fever spike yesterday.  Otherwise, no new symptoms.   Objective: Vital signs in last 24 hours: Blood pressure (!) 110/47, pulse 99, temperature 99.3 F (37.4 C), temperature source Oral, resp. rate 16, height 5\' 9"  (1.753 m), weight 211 lb 10.3 oz (96 kg), SpO2 93 %.  Intake/Output from previous day: 05/23 0701 - 05/24 0700 In: 500  Out: -   Physical Exam: Alert, awake, oriented x3. HEENT: Radiation dermatitis without palpable fluctuance in the soft tissues Lungs: Clear to auscultation bilaterally. Cardiac: S1/S2, regular.  No murmurs, rubs, gallops. Abdomen: Soft, nontender, nondistended.   Extremities: 2+ bilateral lower extremity edema Portacath: without erythema  Lab Results: Recent Labs    02/28/18 0403 03/01/18 0500  WBC 3.9* 6.1  HGB 7.6* 7.7*  HCT 23.7* 23.7*  PLT 121* 137*    BMET Recent Labs    02/27/18 0500 02/28/18 0403  NA 135 136  K 3.9 3.9  CL 94* 93*  CO2 31 31  GLUCOSE 271* 261*  BUN 22* 23*  CREATININE 0.74 0.80  CALCIUM 8.3* 8.3*    No results found for: CEA1  Studies/Results: No results found.  Medications: I have reviewed the patient's current medications.  Assessment/Plan: 72 y.o. Male undergoing radiation therapy with concurrent systemic treatment with carboplatin and paclitaxel for diagnosis of squamous cell carcinoma of head and neck.  Admitted for neutropenic fever with ANC of 0.9 and temperature 102.  Recurrent  fevers despite adequate antibiotic coverage for greater than 72 hours including fluconazole which was started on 02/23/2018.  Started on G-CSF and white blood cell count is responding.  ANC is >5 today   Recommendations: -Okay to discharge home today as long as stable from general medicine standpoint.  No need to administer G-CSF today. -I will arrange for follow-up in my clinic next week.    LOS: 8 days   Ardath Sax, MD   03/01/2018, 10:08 AM

## 2018-03-01 NOTE — Progress Notes (Signed)
[facility] Therapy Treatment Record Phone [phone]   Radiation Therapy was administered to Edgar Herrera on: 03/01/2018  9:29 AM and was treatment # 31 out of a planned course of 35 treatments.  Radiation Treatment  1). Beam photons with 6-10 energy  2). Brachytherapy None  3). Stereotactic Radiosurgery None  4). Other Radiation None     Jacques Earthly, RT (T)

## 2018-03-03 ENCOUNTER — Other Ambulatory Visit: Payer: Self-pay

## 2018-03-03 ENCOUNTER — Emergency Department (HOSPITAL_COMMUNITY): Payer: Medicare Other

## 2018-03-03 ENCOUNTER — Inpatient Hospital Stay (HOSPITAL_COMMUNITY)
Admission: EM | Admit: 2018-03-03 | Discharge: 2018-03-08 | DRG: 871 | Disposition: A | Discharge status: Home Health | Payer: Medicare Other | Attending: Internal Medicine | Admitting: Internal Medicine

## 2018-03-03 DIAGNOSIS — L89312 Pressure ulcer of right buttock, stage 2: Secondary | ICD-10-CM | POA: Diagnosis present

## 2018-03-03 DIAGNOSIS — D649 Anemia, unspecified: Secondary | ICD-10-CM

## 2018-03-03 DIAGNOSIS — R4701 Aphasia: Secondary | ICD-10-CM | POA: Diagnosis present

## 2018-03-03 DIAGNOSIS — J690 Pneumonitis due to inhalation of food and vomit: Secondary | ICD-10-CM | POA: Diagnosis present

## 2018-03-03 DIAGNOSIS — C01 Malignant neoplasm of base of tongue: Secondary | ICD-10-CM | POA: Diagnosis present

## 2018-03-03 DIAGNOSIS — J9601 Acute respiratory failure with hypoxia: Secondary | ICD-10-CM | POA: Diagnosis present

## 2018-03-03 DIAGNOSIS — Z7901 Long term (current) use of anticoagulants: Secondary | ICD-10-CM

## 2018-03-03 DIAGNOSIS — R0602 Shortness of breath: Secondary | ICD-10-CM

## 2018-03-03 DIAGNOSIS — R1310 Dysphagia, unspecified: Secondary | ICD-10-CM | POA: Diagnosis present

## 2018-03-03 DIAGNOSIS — Z8546 Personal history of malignant neoplasm of prostate: Secondary | ICD-10-CM

## 2018-03-03 DIAGNOSIS — I482 Chronic atrial fibrillation, unspecified: Secondary | ICD-10-CM

## 2018-03-03 DIAGNOSIS — I10 Essential (primary) hypertension: Secondary | ICD-10-CM | POA: Diagnosis present

## 2018-03-03 DIAGNOSIS — E1165 Type 2 diabetes mellitus with hyperglycemia: Secondary | ICD-10-CM | POA: Diagnosis present

## 2018-03-03 DIAGNOSIS — D630 Anemia in neoplastic disease: Secondary | ICD-10-CM | POA: Diagnosis present

## 2018-03-03 DIAGNOSIS — L89322 Pressure ulcer of left buttock, stage 2: Secondary | ICD-10-CM | POA: Diagnosis present

## 2018-03-03 DIAGNOSIS — Z8249 Family history of ischemic heart disease and other diseases of the circulatory system: Secondary | ICD-10-CM

## 2018-03-03 DIAGNOSIS — Z9079 Acquired absence of other genital organ(s): Secondary | ICD-10-CM

## 2018-03-03 DIAGNOSIS — Z801 Family history of malignant neoplasm of trachea, bronchus and lung: Secondary | ICD-10-CM

## 2018-03-03 DIAGNOSIS — R509 Fever, unspecified: Secondary | ICD-10-CM

## 2018-03-03 DIAGNOSIS — E119 Type 2 diabetes mellitus without complications: Secondary | ICD-10-CM

## 2018-03-03 DIAGNOSIS — L8990 Pressure ulcer of unspecified site, unspecified stage: Secondary | ICD-10-CM

## 2018-03-03 DIAGNOSIS — Z931 Gastrostomy status: Secondary | ICD-10-CM

## 2018-03-03 DIAGNOSIS — Y95 Nosocomial condition: Secondary | ICD-10-CM | POA: Diagnosis present

## 2018-03-03 DIAGNOSIS — Z803 Family history of malignant neoplasm of breast: Secondary | ICD-10-CM

## 2018-03-03 DIAGNOSIS — R6520 Severe sepsis without septic shock: Secondary | ICD-10-CM | POA: Diagnosis present

## 2018-03-03 DIAGNOSIS — R0902 Hypoxemia: Secondary | ICD-10-CM

## 2018-03-03 DIAGNOSIS — C140 Malignant neoplasm of pharynx, unspecified: Secondary | ICD-10-CM | POA: Diagnosis present

## 2018-03-03 DIAGNOSIS — E785 Hyperlipidemia, unspecified: Secondary | ICD-10-CM | POA: Diagnosis present

## 2018-03-03 DIAGNOSIS — I480 Paroxysmal atrial fibrillation: Secondary | ICD-10-CM | POA: Diagnosis present

## 2018-03-03 DIAGNOSIS — Z9089 Acquired absence of other organs: Secondary | ICD-10-CM

## 2018-03-03 DIAGNOSIS — A419 Sepsis, unspecified organism: Secondary | ICD-10-CM | POA: Diagnosis present

## 2018-03-03 DIAGNOSIS — C029 Malignant neoplasm of tongue, unspecified: Secondary | ICD-10-CM

## 2018-03-03 DIAGNOSIS — R0600 Dyspnea, unspecified: Secondary | ICD-10-CM

## 2018-03-03 DIAGNOSIS — Z794 Long term (current) use of insulin: Secondary | ICD-10-CM

## 2018-03-03 LAB — GLUCOSE, CAPILLARY
GLUCOSE-CAPILLARY: 117 mg/dL — AB (ref 65–99)
GLUCOSE-CAPILLARY: 125 mg/dL — AB (ref 65–99)
GLUCOSE-CAPILLARY: 147 mg/dL — AB (ref 65–99)
Glucose-Capillary: 159 mg/dL — ABNORMAL HIGH (ref 65–99)

## 2018-03-03 LAB — CBC WITH DIFFERENTIAL/PLATELET
BLASTS: 0 %
Band Neutrophils: 3 %
Basophils Absolute: 0 10*3/uL (ref 0.0–0.1)
Basophils Relative: 0 %
Eosinophils Absolute: 0 10*3/uL (ref 0.0–0.7)
Eosinophils Relative: 0 %
HEMATOCRIT: 27 % — AB (ref 39.0–52.0)
Hemoglobin: 8.5 g/dL — ABNORMAL LOW (ref 13.0–17.0)
LYMPHS PCT: 3 %
Lymphs Abs: 0.1 10*3/uL — ABNORMAL LOW (ref 0.7–4.0)
MCH: 28.4 pg (ref 26.0–34.0)
MCHC: 31.5 g/dL (ref 30.0–36.0)
MCV: 90.3 fL (ref 78.0–100.0)
METAMYELOCYTES PCT: 0 %
MONOS PCT: 4 %
Monocytes Absolute: 0.2 10*3/uL (ref 0.1–1.0)
Myelocytes: 0 %
Neutro Abs: 4 10*3/uL (ref 1.7–7.7)
Neutrophils Relative %: 90 %
PROMYELOCYTES RELATIVE: 0 %
Platelets: 170 10*3/uL (ref 150–400)
RBC: 2.99 MIL/uL — AB (ref 4.22–5.81)
RDW: 16.6 % — ABNORMAL HIGH (ref 11.5–15.5)
WBC: 4.3 10*3/uL (ref 4.0–10.5)
nRBC: 0 /100 WBC

## 2018-03-03 LAB — COMPREHENSIVE METABOLIC PANEL
ALK PHOS: 72 U/L (ref 38–126)
ALT: 68 U/L — ABNORMAL HIGH (ref 17–63)
AST: 33 U/L (ref 15–41)
Albumin: 2.7 g/dL — ABNORMAL LOW (ref 3.5–5.0)
Anion gap: 10 (ref 5–15)
BILIRUBIN TOTAL: 0.2 mg/dL — AB (ref 0.3–1.2)
BUN: 24 mg/dL — AB (ref 6–20)
CO2: 31 mmol/L (ref 22–32)
Calcium: 8.3 mg/dL — ABNORMAL LOW (ref 8.9–10.3)
Chloride: 95 mmol/L — ABNORMAL LOW (ref 101–111)
Creatinine, Ser: 0.86 mg/dL (ref 0.61–1.24)
GFR calc Af Amer: >60 mL/min (ref >60)
GFR calc non Af Amer: >60 mL/min (ref >60)
Glucose, Bld: 227 mg/dL — ABNORMAL HIGH (ref 65–99)
POTASSIUM: 4.4 mmol/L (ref 3.5–5.1)
SODIUM: 136 mmol/L (ref 135–145)
TOTAL PROTEIN: 6.3 g/dL — AB (ref 6.5–8.1)

## 2018-03-03 LAB — I-STAT CG4 LACTIC ACID, ED: Lactic Acid, Venous: 1.04 mmol/L (ref 0.5–1.9)

## 2018-03-03 LAB — PROTIME-INR
INR: 1.22
Prothrombin Time: 15.3 seconds — ABNORMAL HIGH (ref 11.4–15.2)

## 2018-03-03 LAB — MRSA PCR SCREENING: MRSA by PCR: NEGATIVE

## 2018-03-03 MED ORDER — INSULIN ASPART 100 UNIT/ML ~~LOC~~ SOLN
0.0000 [IU] | SUBCUTANEOUS | Status: DC
  Administered 2018-03-03: 2 [IU] via SUBCUTANEOUS
  Administered 2018-03-03 (×2): 1 [IU] via SUBCUTANEOUS
  Administered 2018-03-04 (×2): 3 [IU] via SUBCUTANEOUS
  Administered 2018-03-04: 2 [IU] via SUBCUTANEOUS
  Administered 2018-03-04: 5 [IU] via SUBCUTANEOUS
  Administered 2018-03-04 – 2018-03-05 (×2): 7 [IU] via SUBCUTANEOUS
  Administered 2018-03-05 (×3): 5 [IU] via SUBCUTANEOUS
  Administered 2018-03-05: 3 [IU] via SUBCUTANEOUS
  Administered 2018-03-05: 5 [IU] via SUBCUTANEOUS
  Administered 2018-03-06: 9 [IU] via SUBCUTANEOUS
  Administered 2018-03-06: 5 [IU] via SUBCUTANEOUS
  Administered 2018-03-06: 7 [IU] via SUBCUTANEOUS
  Administered 2018-03-06: 9 [IU] via SUBCUTANEOUS
  Administered 2018-03-06: 7 [IU] via SUBCUTANEOUS
  Administered 2018-03-06: 5 [IU] via SUBCUTANEOUS
  Administered 2018-03-07: 9 [IU] via SUBCUTANEOUS
  Administered 2018-03-07: 5 [IU] via SUBCUTANEOUS
  Administered 2018-03-07: 2 [IU] via SUBCUTANEOUS
  Administered 2018-03-07: 3 [IU] via SUBCUTANEOUS
  Administered 2018-03-07: 5 [IU] via SUBCUTANEOUS
  Administered 2018-03-08: 3 [IU] via SUBCUTANEOUS
  Administered 2018-03-08: 2 [IU] via SUBCUTANEOUS
  Administered 2018-03-08: 1 [IU] via SUBCUTANEOUS
  Administered 2018-03-08: 3 [IU] via SUBCUTANEOUS

## 2018-03-03 MED ORDER — ONDANSETRON HCL 4 MG PO TABS: 4.0000 mg | ORAL_TABLET | Freq: Four times a day (QID) | ORAL | Status: DC | PRN

## 2018-03-03 MED ORDER — METOPROLOL TARTRATE 5 MG/5ML IV SOLN
2.5000 mg | INTRAVENOUS | Status: DC | PRN
  Administered 2018-03-04 (×2): 2.5 mg via INTRAVENOUS
  Filled 2018-03-03: qty 5

## 2018-03-03 MED ORDER — APIXABAN 5 MG PO TABS
5.0000 mg | ORAL_TABLET | Freq: Two times a day (BID) | ORAL | Status: DC
  Administered 2018-03-03 – 2018-03-08 (×11): 5 mg
  Filled 2018-03-03 (×11): qty 1

## 2018-03-03 MED ORDER — VITAL HIGH PROTEIN PO LIQD: 1000.0000 mL | ORAL | Status: DC

## 2018-03-03 MED ORDER — PRAVASTATIN SODIUM 40 MG PO TABS
40.0000 mg | ORAL_TABLET | Freq: Every day | ORAL | Status: DC
  Administered 2018-03-03 – 2018-03-08 (×6): 40 mg
  Filled 2018-03-03 (×3): qty 1
  Filled 2018-03-03 (×3): qty 2

## 2018-03-03 MED ORDER — HYDROCODONE-ACETAMINOPHEN 7.5-325 MG/15ML PO SOLN: 10.0000 mL | Freq: Four times a day (QID) | ORAL | Status: DC | PRN

## 2018-03-03 MED ORDER — PIPERACILLIN-TAZOBACTAM 3.375 G IVPB 30 MIN
3.3750 g | Freq: Once | INTRAVENOUS | Status: AC
  Administered 2018-03-03: 3.375 g via INTRAVENOUS
  Filled 2018-03-03: qty 50

## 2018-03-03 MED ORDER — ALLOPURINOL 100 MG PO TABS
100.0000 mg | ORAL_TABLET | Freq: Every day | ORAL | Status: DC
  Administered 2018-03-03 – 2018-03-08 (×6): 100 mg
  Filled 2018-03-03 (×6): qty 1

## 2018-03-03 MED ORDER — FREE WATER
60.0000 mL | Freq: Every day | Status: DC
  Administered 2018-03-03 – 2018-03-08 (×24): 60 mL

## 2018-03-03 MED ORDER — ONDANSETRON HCL 4 MG/2ML IJ SOLN: 4.0000 mg | Freq: Four times a day (QID) | INTRAMUSCULAR | Status: DC | PRN

## 2018-03-03 MED ORDER — POLYVINYL ALCOHOL 1.4 % OP SOLN
1.0000 [drp] | Freq: Every day | OPHTHALMIC | Status: DC
Start: 2018-03-03 — End: 2018-03-08
  Administered 2018-03-03 – 2018-03-05 (×3): 1 [drp] via OPHTHALMIC
  Filled 2018-03-03: qty 15

## 2018-03-03 MED ORDER — AMLODIPINE BESYLATE 10 MG PO TABS
10.0000 mg | ORAL_TABLET | Freq: Every day | ORAL | Status: DC
  Administered 2018-03-03 – 2018-03-04 (×2): 10 mg
  Filled 2018-03-03 (×3): qty 1

## 2018-03-03 MED ORDER — CYANOCOBALAMIN 250 MCG PO TABS
500.0000 ug | ORAL_TABLET | Freq: Two times a day (BID) | ORAL | Status: DC
  Administered 2018-03-03 – 2018-03-08 (×11): 500 ug
  Filled 2018-03-03 (×2): qty 2
  Filled 2018-03-03: qty 1
  Filled 2018-03-03: qty 2
  Filled 2018-03-03: qty 1
  Filled 2018-03-03 (×2): qty 2
  Filled 2018-03-03 (×3): qty 1
  Filled 2018-03-03: qty 2

## 2018-03-03 MED ORDER — ACETAMINOPHEN 650 MG RE SUPP: 650.0000 mg | Freq: Four times a day (QID) | RECTAL | Status: DC | PRN

## 2018-03-03 MED ORDER — POLYETHYLENE GLYCOL 3350 17 G PO PACK: 17.0000 g | PACK | Freq: Every day | ORAL | Status: DC | PRN

## 2018-03-03 MED ORDER — IOPAMIDOL (ISOVUE-370) INJECTION 76%
100.0000 mL | Freq: Once | INTRAVENOUS | Status: AC | PRN
  Administered 2018-03-03: 73 mL via INTRAVENOUS

## 2018-03-03 MED ORDER — ORAL CARE MOUTH RINSE: 15.0000 mL | Freq: Two times a day (BID) | OROMUCOSAL | Status: DC

## 2018-03-03 MED ORDER — VANCOMYCIN HCL IN DEXTROSE 1-5 GM/200ML-% IV SOLN: 1000.0000 mg | Freq: Once | INTRAVENOUS | Status: DC

## 2018-03-03 MED ORDER — ACETAMINOPHEN 325 MG PO TABS: 650.0000 mg | ORAL_TABLET | Freq: Four times a day (QID) | ORAL | Status: DC | PRN

## 2018-03-03 MED ORDER — PIPERACILLIN-TAZOBACTAM 3.375 G IVPB
3.3750 g | Freq: Three times a day (TID) | INTRAVENOUS | Status: DC
  Administered 2018-03-03 – 2018-03-07 (×12): 3.375 g via INTRAVENOUS
  Filled 2018-03-03 (×11): qty 50

## 2018-03-03 MED ORDER — VANCOMYCIN HCL 10 G IV SOLR
2000.0000 mg | Freq: Once | INTRAVENOUS | Status: AC
  Administered 2018-03-03: 2000 mg via INTRAVENOUS
  Filled 2018-03-03: qty 2000

## 2018-03-03 MED ORDER — CHLORHEXIDINE GLUCONATE 0.12 % MT SOLN
15.0000 mL | Freq: Two times a day (BID) | OROMUCOSAL | Status: DC
  Administered 2018-03-03 – 2018-03-08 (×11): 15 mL via OROMUCOSAL
  Filled 2018-03-03 (×9): qty 15

## 2018-03-03 MED ORDER — SODIUM CHLORIDE 0.9 % IV BOLUS
1000.0000 mL | Freq: Once | INTRAVENOUS | Status: AC
  Administered 2018-03-03: 1000 mL via INTRAVENOUS

## 2018-03-03 MED ORDER — IOPAMIDOL (ISOVUE-370) INJECTION 76%
INTRAVENOUS | Status: AC
  Filled 2018-03-03: qty 100

## 2018-03-03 MED ORDER — ORAL CARE MOUTH RINSE
15.0000 mL | Freq: Two times a day (BID) | OROMUCOSAL | Status: DC
  Administered 2018-03-03 – 2018-03-08 (×9): 15 mL via OROMUCOSAL

## 2018-03-03 MED ORDER — VANCOMYCIN HCL 10 G IV SOLR
1500.0000 mg | INTRAVENOUS | Status: DC
  Administered 2018-03-04 – 2018-03-06 (×3): 1500 mg via INTRAVENOUS
  Filled 2018-03-03 (×4): qty 1500

## 2018-03-03 MED ORDER — JEVITY 1.5 CAL/FIBER PO LIQD
1000.0000 mL | ORAL | Status: DC
  Administered 2018-03-03 – 2018-03-04 (×2): 1000 mL
  Filled 2018-03-03 (×3): qty 1000

## 2018-03-03 MED ORDER — LORAZEPAM 0.5 MG PO TABS
0.5000 mg | ORAL_TABLET | Freq: Four times a day (QID) | ORAL | Status: DC | PRN
  Administered 2018-03-05 – 2018-03-08 (×4): 0.5 mg
  Filled 2018-03-03 (×4): qty 1

## 2018-03-03 NOTE — H&P (Signed)
History and Physical    Edgar Herrera UXL:[id number] DOB: 10-Jul-1946 DOA: 03/03/2018  PCP: Edgar Herrera  Patient coming from: Home  Chief Complaint: Unresponsive at home, EMS found pulse ox in 50s.   HPI: Edgar Herrera is a 72 y.o. male with medical history significant of paroxysmal atrial fibrillation, type 2 diabetes, hyperlipidemia, hypertension, recent diagnosis of squamous cell cancer of the throat, PEG tube, mostly nonverbal at baseline who was recently discharged from the hospital after neutropenic fever.  His wife provides history.  She states that the past couple of days since his discharge from the hospital, he had been doing well.  He usually has a gurgle in the back of his throat, and sleeps in bed while his wife sleeps on the couch.  She states that she woke up and noticed his gurgle sound has changed from his baseline.  When she went to see him, patient was unresponsive.  EMS was called.  Upon EMS arrival, patient's pulse ox was in the 50s.  This improved with oxygen administration.  Patient is much more alert, interactive on my examination in the emergency department.  He is able to communicate by nodding and shaking his head yes or no.  This is baseline per wife.  He denies any pain, no chest pain and no shortness of breath.  He is able to mouth to me his wife's name and he knows that we are at the hospital.  ED Course: Labs obtained.  Chest x-ray negative for acute cardiopulmonary change.  CTA chest without PE.  Pneumonia found right lower lobe as well as right upper lobe.  Patient was started on empiric Vanco and Zosyn.  Review of Systems: As per HPI otherwise 10 point review of systems negative.   Past Medical History:  Diagnosis Date  . Allergic rhinitis   . Atrial fibrillation ([place])   . Diabetes mellitus without complication ([place])   . HLD (hyperlipidemia) 02/21/2018  . Hyperlipidemia   . Hypertension   . Prostate cancer ([place])   . T2DM (type 2 diabetes mellitus) ([place])  02/21/2018    Past Surgical History:  Procedure Laterality Date  . KNEE SURGERY Bilateral    2007 and 1998,   . PROSTATECTOMY  12/15/2006  . SKIN GRAFT     as a child left elbow  . TONSILECTOMY/ADENOIDECTOMY WITH MYRINGOTOMY     as a child     reports that he has never smoked. He has never used smokeless tobacco. He reports that he drinks alcohol. He reports that he does not use drugs.  No Known Allergies  Family History  Problem Relation Age of Onset  . Lung cancer Mother   . Hypertension Mother   . Breast cancer Mother   . Heart attack Father   . Hypertension Sister   . Hypertension Brother   . Hypertension Sister     Prior to Admission medications   Medication Sig Start Date End Date Taking? Authorizing Provider  allopurinol (ZYLOPRIM) 100 MG tablet Take 100 mg by mouth daily.    [provider]  amLODipine (NORVASC) 10 MG tablet Take 10 mg by mouth daily.    [provider]  apixaban (ELIQUIS) 5 MG TABS tablet Take 5 mg by mouth 2 (two) times daily.    [provider]  Artificial Tear Solution (SYSTANE CONTACTS) SOLN Place 1 drop into both eyes daily.     [provider]  cyanocobalamin 500 MCG tablet Take 500 mcg by mouth 2 (two) times daily.  [provider]  dexamethasone (DECADRON) 4 MG tablet Take 2 tablets (8 mg total) by mouth daily. Start the day after chemotherapy for 2 days. 01/08/18   Edgar Sax, MD  glipiZIDE (GLUCOTROL) 5 MG tablet Take 2.5-5 mg by mouth See admin instructions. Take 1 tablet before breakfast and Take 1/2 tablet before dinner.    [provider]  HYDROcodone-acetaminophen (HYCET) 7.5-325 mg/15 ml solution Take 10 mLs by mouth 4 (four) times daily as needed for moderate pain.    [provider]  lidocaine (XYLOCAINE) 2 % solution Patient: Mix 1part 2% viscous lidocaine, 1part H20. Swish & swallow 75mL of diluted mixture, 46min before meals and at bedtime, up to QID 01/29/18    Edgar Gibson, MD  lidocaine-prilocaine (EMLA) cream Apply to affected area once Patient taking differently: Apply 1 application topically as needed.  01/08/18   Edgar Sax, MD  LORazepam (ATIVAN) 0.5 MG tablet Take 1 tablet (0.5 mg total) by mouth every 6 (six) hours as needed (Nausea or vomiting). Patient taking differently: Take 0.5 mg by mouth every 6 (six) hours as needed for anxiety (Nausea and Vomiting).  02/18/18   Edgar Gibson, MD  metFORMIN (GLUCOPHAGE) 1000 MG tablet Take 1,000 mg by mouth 2 (two) times daily with a meal.    [provider]  ondansetron (ZOFRAN) 8 MG tablet Take 1 tablet (8 mg total) by mouth 2 (two) times daily as needed for refractory nausea / vomiting. Start on day 3 after chemo. Patient not taking: Reported on 02/08/2018 01/08/18   Edgar Sax, MD  pravastatin (PRAVACHOL) 40 MG tablet Take 40 mg by mouth daily.    [provider]  prochlorperazine (COMPAZINE) 10 MG tablet Take 1 tablet (10 mg total) by mouth every 6 (six) hours as needed (Nausea or vomiting). Patient not taking: Reported on 02/08/2018 01/08/18   Edgar Sax, MD  sodium fluoride (FLUORISHIELD) 1.1 % GEL dental gel Instill one drop of gel per tooth space of fluoride tray. Place over teeth for 5 minutes. Remove. Spit out excess. Repeat nightly. Patient taking differently: Place 1 application onto teeth at bedtime. Instill one drop of gel per tooth space of fluoride tray. Place over teeth for 5 minutes. Remove. Spit out excess. Repeat nightly. 01/01/18   Edgar Herrera, DDS    Physical Exam: Vitals:   03/03/18 0730 03/03/18 0800 03/03/18 0830 03/03/18 0900  BP: 116/63 98/60 (!) 86/60 98/61  Pulse: (!) 124 (!) 119 (!) 117 (!) 113  Resp: 12 (!) 9 (!) 9 10  Temp:      TempSrc:      SpO2: 99% 100% 100% 100%  Weight:      Height:         Constitutional: NAD, calm, comfortable Eyes: PERRL, lids and conjunctivae normal ENMT: Mucous membranes are dry.  Skin changes  secondary to radiation Respiratory: Coarse breath sounds bilaterally, no wheezing, no crackles. Normal respiratory effort. No accessory muscle use.  Appears comfortable on nonrebreather Cardiovascular: Irregular rhythm, rate tachycardic. no murmurs / rubs / gallops.  Trace extremity edema.  Abdomen: no tenderness, no masses palpated. No hepatosplenomegaly. Bowel sounds positive. + PEG Musculoskeletal: no clubbing / cyanosis. No joint deformity upper and lower extremities. Good ROM, no contractures. Normal muscle tone.  Skin: no rashes, lesions, ulcers. No induration Neurologic: Nonfocal exam.  Alert, awake, interactive, able to answer questions appropriately by nodding head yes or no. Psychiatric: Normal judgment and insight.  Stable.  Labs on Admission:  I have personally reviewed following labs and imaging studies  CBC: Recent Labs  Lab 02/26/18 0533 02/27/18 0500 02/28/18 0403 03/01/18 0500 03/03/18 0656  WBC 2.3* 2.0* 3.9* 6.1 4.3  NEUTROABS 1.8 1.5* 3.3 5.4 4.0  HGB 7.9* 8.0* 7.6* 7.7* 8.5*  HCT 23.9* 24.2* 23.7* 23.7* 27.0*  MCV 87.9 87.4 88.1 88.8 90.3  PLT 120* 120* 121* 137* 726   Basic Metabolic Panel: Recent Labs  Lab 02/24/18 1222 02/25/18 0444 02/26/18 0533 02/27/18 0500 02/28/18 0403 03/03/18 0656  NA  --  135  --  135 136 136  K  --  3.4*  --  3.9 3.9 4.4  CL  --  97*  --  94* 93* 95*  CO2  --  27  --  31 31 31   GLUCOSE 391* 254*  --  271* 261* 227*  BUN  --  24*  --  22* 23* 24*  CREATININE  --  0.71 0.72 0.74 0.80 0.86  CALCIUM  --  8.1*  --  8.3* 8.3* 8.3*  MG  --   --   --  1.7  --   --    GFR: Estimated Creatinine Clearance: 87.3 mL/min (by C-G formula based on SCr of 0.86 mg/dL). Liver Function Tests: Recent Labs  Lab 02/25/18 0444 03/03/18 0656  AST 46* 33  ALT 96* 68*  ALKPHOS 59 72  BILITOT 0.5 0.2*  PROT 6.0* 6.3*  ALBUMIN 2.4* 2.7*   No results for input(s): LIPASE, AMYLASE in the last 168 hours. No results for input(s): AMMONIA in  the last 168 hours. Coagulation Profile: Recent Labs  Lab 03/03/18 0656  INR 1.22   Cardiac Enzymes: No results for input(s): CKTOTAL, CKMB, CKMBINDEX, TROPONINI in the last 168 hours. BNP (last 3 results) No results for input(s): PROBNP in the last 8760 hours. HbA1C: No results for input(s): HGBA1C in the last 72 hours. CBG: Recent Labs  Lab 02/28/18 1229 02/28/18 1655 02/28/18 2049 03/01/18 0746 03/01/18 1135  GLUCAP 325* 195* 261* 224* 305*   Lipid Profile: No results for input(s): CHOL, HDL, LDLCALC, TRIG, CHOLHDL, LDLDIRECT in the last 72 hours. Thyroid Function Tests: No results for input(s): TSH, T4TOTAL, FREET4, T3FREE, THYROIDAB in the last 72 hours. Anemia Panel: No results for input(s): VITAMINB12, FOLATE, FERRITIN, TIBC, IRON, RETICCTPCT in the last 72 hours. Urine analysis:    Component Value Date/Time   COLORURINE YELLOW 02/21/2018 0812   APPEARANCEUR CLEAR 02/21/2018 0812   LABSPEC 1.020 02/21/2018 0812   PHURINE 6.0 02/21/2018 0812   GLUCOSEU 150 (A) 02/21/2018 0812   HGBUR NEGATIVE 02/21/2018 0812   BILIRUBINUR NEGATIVE 02/21/2018 0812   KETONESUR NEGATIVE 02/21/2018 0812   PROTEINUR 30 (A) 02/21/2018 0812   NITRITE NEGATIVE 02/21/2018 0812   LEUKOCYTESUR NEGATIVE 02/21/2018 0812   Sepsis Labs: !!!!!!!!!!!!!!!!!!!!!!!!!!!!!!!!!!!!!!!!!!!! @LABRCNTIP (procalcitonin:4,lacticidven:4) ) Recent Results (from the past 240 hour(s))  Culture, blood (x 2)     Status: None   Collection Time: 02/24/18 10:05 AM  Result Value Ref Range Status   Specimen Description   Final    BLOOD RIGHT ANTECUBITAL Performed at [facility], [place] [address], [place], [place] [address]    Special Requests   Final    BOTTLES DRAWN AEROBIC AND ANAEROBIC Blood Culture adequate volume Performed at [facility] [address] [place], [place] [address]    Culture   Final    NO GROWTH 5 DAYS Performed at [facility], [place]  [address],  [place], [place] [address]    Report Status 03/01/2018 FINAL  Final  Culture, blood (x 2)     Status: None   Collection Time: 02/24/18 10:06 AM  Result Value Ref Range Status   Specimen Description   Final    BLOOD LEFT ANTECUBITAL Performed at [facility] [address] [place], [place] [address]    Special Requests   Final    BOTTLES DRAWN AEROBIC ONLY Blood Culture adequate volume Performed at [facility] [address], [place], [place] [address]    Culture   Final    NO GROWTH 5 DAYS Performed at [facility], [place] [address] [place], [place] [address]    Report Status 03/01/2018 FINAL  Final     Radiological Exams on Admission: Ct Angio Chest Pe W And/or Wo Contrast  Result Date: 03/03/2018 CLINICAL DATA:  Hypoxia. EXAM: CT ANGIOGRAPHY CHEST WITH CONTRAST TECHNIQUE: Multidetector CT imaging of the chest was performed using the standard protocol during bolus administration of intravenous contrast. Multiplanar CT image reconstructions and MIPs were obtained to evaluate the vascular anatomy. CONTRAST:  54mL ISOVUE-370 IOPAMIDOL (ISOVUE-370) INJECTION 76% COMPARISON:  Radiograph of same day. FINDINGS: Cardiovascular: Satisfactory opacification of the pulmonary arteries to the segmental level. No evidence of pulmonary embolism. Normal heart size. No pericardial effusion. Mediastinum/Nodes: No enlarged mediastinal, hilar, or axillary lymph nodes. Thyroid gland, trachea, and esophagus demonstrate no significant findings. Lungs/Pleura: No pneumothorax or pleural effusion is noted. Mild left basilar atelectasis is noted. Mild atelectasis or possibly pneumonia is noted in superior segment of right lower lobe. Mild right upper lobe subsegmental atelectasis or inflammation is noted. Upper Abdomen: Large solitary gallstone is noted without inflammation. Gastrostomy tube is in grossly good position. Musculoskeletal: No chest wall abnormality. No  acute or significant osseous findings. Review of the MIP images confirms the above findings. IMPRESSION: No definite evidence of pulmonary embolus. Atelectasis or pneumonia is noted in superior segment of right lower lobe. Mild right upper lobe subsegmental atelectasis or pneumonia is noted. Mild left basilar subsegmental atelectasis is noted. Large solitary gallstone is noted. Gastrostomy tube in grossly good position. Electronically Signed   By: Marijo Conception, M.D.   On: 03/03/2018 09:17   Dg Chest Port 1 View  Result Date: 03/03/2018 CLINICAL DATA:  Fevers. EXAM: PORTABLE CHEST 1 VIEW COMPARISON:  02/21/2018 FINDINGS: Left chest wall port a catheter is noted with tip in the right atrium. Unchanged. There is cardiac enlargement. No pleural effusion or edema. No airspace opacities. IMPRESSION: 1. No acute cardiopulmonary abnormalities. Electronically Signed   By: Kerby Moors M.D.   On: 03/03/2018 07:49    EKG: Independently reviewed.  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 Was treated for radiation induced dermatitis/stomatitis as well as oropharyngeal candidiasis  -CT chest with consolidation of superior segment right lower lobse and right upper lobe  -Blood cultures pending  -Continue vanco/zosyn   Paroxysmal atrial fibrillation -Continue eliquis   Essential  hypertension -Continue norvasc    Hyperlipidemia -Continue pravachol   Type 2 diabetes, with hyperglycemia -Hold glipizide, metformin  -SSI   Squamous cell carcinoma of head/neck -Followed by Dr. Lebron Conners, currently undergoing radiation therapy with concurrent carboplatin and paclitaxel -PEG for nutrition, dietitian consulted     DVT prophylaxis: Eliquis Code Status: Full, discussed with wife at bedside  Family Communication: Wife at bedside Disposition Plan: Pending improvement, return home once stable Consults called: None  Admission status: Inpatient  * I certify that at the point of admission it is my clinical judgment that the patient will require inpatient hospital care spanning beyond 2 midnights from the point of admission due to high intensity of service, high risk for further deterioration and high frequency of surveillance required.*   Dessa Phi, DO [facility] [email or web address] Password [facility] 03/03/2018, 9:49 AM

## 2018-03-03 NOTE — ED Triage Notes (Signed)
18 in RAC 1000 ns in already.

## 2018-03-03 NOTE — ED Provider Notes (Signed)
[facility] DEPT Provider Note   CSN: [id number] Arrival date & time: 03/03/18  2993     History   Chief Complaint Chief Complaint  Patient presents with  . Blood Infection  . Respiratory Distress    HPI Bora Broner is a 72 y.o. male.  The history is provided by the EMS personnel. The history is limited by the condition of the patient (Aph[place]).  He has history of diabetes, hyperlipidemia, hypertension, atrial fibrillation, carcinoma of the tongue who is discharged from the hospital 2 days ago after being admitted for sepsis was brought in by ambulance because of difficulty breathing which started last night.  EMS noted severe hypoxia with oxygen saturation 50%, improved when placed on oxygen.  Of note, patient is nonverbal at baseline.  Past Medical History:  Diagnosis Date  . Allergic rhinitis   . Atrial fibrillation ([place])   . Diabetes mellitus without complication ([place])   . HLD (hyperlipidemia) 02/21/2018  . Hyperlipidemia   . Hypertension   . Prostate cancer ([place])   . T2DM (type 2 diabetes mellitus) ([place]) 02/21/2018    Patient Active Problem List   Diagnosis Date Noted  . Throat cancer ([place])   . Dermatitis   . Neutropenic fever ([place])   . Sepsis ([place])   . HLD (hyperlipidemia) 02/21/2018  . Fever 02/21/2018  . T2DM (type 2 diabetes mellitus) ([place]) 02/21/2018  . 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 Atrial flutter ([place]) 05/14/2017  . Chronic anticoagulation 05/14/2017  . Essential hypertension 05/14/2017  . Paroxysmal atrial fibrillation ([place]) 05/13/2017    Past Surgical History:  Procedure Laterality Date  . KNEE SURGERY Bilateral    2007 and 1998,   . PROSTATECTOMY  12/15/2006  . SKIN GRAFT     as a child left elbow  . TONSILECTOMY/ADENOIDECTOMY WITH MYRINGOTOMY     as a child        Home Medications    Prior to Admission medications   Medication Sig Start Date End Date Taking? Authorizing  Provider  allopurinol (ZYLOPRIM) 100 MG tablet Take 100 mg by mouth daily.    [provider]  amLODipine (NORVASC) 10 MG tablet Take 10 mg by mouth daily.    [provider]  apixaban (ELIQUIS) 5 MG TABS tablet Take 5 mg by mouth 2 (two) times daily.    [provider]  Artificial Tear Solution (SYSTANE CONTACTS) SOLN Place 1 drop into both eyes daily.     [provider]  cyanocobalamin 500 MCG tablet Take 500 mcg by mouth 2 (two) times daily.    [provider]  dexamethasone (DECADRON) 4 MG tablet Take 2 tablets (8 mg total) by mouth daily. Start the day after chemotherapy for 2 days. 01/08/18   Ardath Sax, MD  glipiZIDE (GLUCOTROL) 5 MG tablet Take 2.5-5 mg by mouth See admin instructions. Take 1 tablet before breakfast and Take 1/2 tablet before dinner.    [provider]  HYDROcodone-acetaminophen (HYCET) 7.5-325 mg/15 ml solution Take 10 mLs by mouth 4 (four) times daily as needed for moderate pain.    [provider]  lidocaine (XYLOCAINE) 2 % solution Patient: Mix 1part 2% viscous lidocaine, 1part H20. Swish & swallow 48mL of diluted mixture, 67min before meals and at bedtime, up to QID 01/29/18   [name], MD  lidocaine-prilocaine (EMLA) cream Apply to affected area once Patient taking differently: Apply 1 application topically as needed.  01/08/18   Perlov,  Marinell Blight, MD  LORazepam (ATIVAN) 0.5 MG tablet Take 1 tablet (0.5 mg total) by mouth every 6 (six) hours as needed (Nausea or vomiting). Patient taking differently: Take 0.5 mg by mouth every 6 (six) hours as needed for anxiety (Nausea and Vomiting).  02/18/18   [name], MD  metFORMIN (GLUCOPHAGE) 1000 MG tablet Take 1,000 mg by mouth 2 (two) times daily with a meal.    [provider]  ondansetron (ZOFRAN) 8 MG tablet Take 1 tablet (8 mg total) by mouth 2 (two) times daily as needed for refractory nausea / vomiting. Start on day 3 after chemo. Patient  not taking: Reported on 02/08/2018 01/08/18   Ardath Sax, MD  pravastatin (PRAVACHOL) 40 MG tablet Take 40 mg by mouth daily.    [provider]  prochlorperazine (COMPAZINE) 10 MG tablet Take 1 tablet (10 mg total) by mouth every 6 (six) hours as needed (Nausea or vomiting). Patient not taking: Reported on 02/08/2018 01/08/18   Ardath Sax, MD  sodium fluoride (FLUORISHIELD) 1.1 % GEL dental gel Instill one drop of gel per tooth space of fluoride tray. Place over teeth for 5 minutes. Remove. Spit out excess. Repeat nightly. Patient taking differently: Place 1 application onto teeth at bedtime. Instill one drop of gel per tooth space of fluoride tray. Place over teeth for 5 minutes. Remove. Spit out excess. Repeat nightly. 01/01/18   Lenn Cal, DDS    Family History Family History  Problem Relation Age of Onset  . Lung cancer Mother   . Hypertension Mother   . Breast cancer Mother   . Heart attack Father   . Hypertension Sister   . Hypertension Brother   . Hypertension Sister     Social History Social History   Tobacco Use  . Smoking status: Never Smoker  . Smokeless tobacco: Never Used  Substance Use Topics  . Alcohol use: Yes    Comment: reports occasional beer or wine. maybe two times monthly  . Drug use: No     Allergies   Patient has no known allergies.   Review of Systems Review of Systems  Unable to perform ROS: Patient nonverbal     Physical Exam Updated Vital Signs BP (!) 115/51 (BP Location: Left Arm)   Pulse (!) 131   Temp (!) 102.4 F (39.1 C) (Rectal)   Resp 12   SpO2 99%   Physical Exam  Nursing note and vitals reviewed.  Ill-appearing 72 year old male, resting comfortably and in no acute distress. Vital signs are significant for fever and rapid heart rate. Oxygen saturation is 99%, which is normal, but is obtained while on 100% oxygen via nonrebreather mask. Head is normocephalic and atraumatic. PERRLA, EOMI. Oropharynx is  clear. Neck is nontender and supple without adenopathy or JVD.  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 Back is nontender and there is no CVA tenderness. Lungs are clear without rales, wheezes, or rhonchi. Chest is nontender. Heart has regular rate and rhythm without murmur. Abdomen is soft, flat, nontender without masses or hepatosplenomegaly and peristalsis is normoactive. Extremities have 1+ edema, full range of motion is present. Skin is warm and dry without other rash. Neurologic: Awake and able to answer questions yes or no by nodding his head, cranial nerves are intact, there are no motor or sensory deficits.  ED Treatments / Results  Labs (all labs ordered are listed, but  only abnormal results are displayed) Labs Reviewed  COMPREHENSIVE METABOLIC PANEL - Abnormal; Notable for the following components:      Result Value   Chloride 95 (*)    Glucose, Bld 227 (*)    BUN 24 (*)    Calcium 8.3 (*)    Total Protein 6.3 (*)    Albumin 2.7 (*)    ALT 68 (*)    Total Bilirubin 0.2 (*)    All other components within normal limits  CBC WITH DIFFERENTIAL/PLATELET - Abnormal; Notable for the following components:   RBC 2.99 (*)    Hemoglobin 8.5 (*)    HCT 27.0 (*)    RDW 16.6 (*)    All other components within normal limits  PROTIME-INR - Abnormal; Notable for the following components:   Prothrombin Time 15.3 (*)    All other components within normal limits  CULTURE, BLOOD (ROUTINE X 2)  CULTURE, BLOOD (ROUTINE X 2)  URINALYSIS, ROUTINE W REFLEX MICROSCOPIC  I-STAT CG4 LACTIC ACID, ED  I-STAT CG4 LACTIC ACID, ED    EKG EKG Interpretation  Date/Time:  Sunday Mar 03 2018 06:50:03 EDT Ventricular Rate:  125 PR Interval:    QRS Duration: 98 QT Interval:  304 QTC Calculation: 439 R Axis:   68 Text Interpretation:  Atrial fibrillation When compared with ECG of 02/21/2018, No significant change was  found Confirmed by Delora Fuel ([id number]) on 03/03/2018 6:53:09 AM   Radiology Dg Chest Port 1 View  Result Date: 03/03/2018 CLINICAL DATA:  Fevers. EXAM: PORTABLE CHEST 1 VIEW COMPARISON:  02/21/2018 FINDINGS: Left chest wall port a catheter is noted with tip in the right atrium. Unchanged. There is cardiac enlargement. No pleural effusion or edema. No airspace opacities. IMPRESSION: 1. No acute cardiopulmonary abnormalities. Electronically Signed   By: Kerby Moors M.D.   On: 03/03/2018 07:49    Procedures Procedures  CRITICAL CARE Performed by: Delora Fuel Total critical care time: 45 minutes Critical care time was exclusive of separately billable procedures and treating other patients. Critical care was necessary to treat or prevent imminent or life-threatening deterioration. Critical care was time spent personally by me on the following activities: development of treatment plan with patient and/or surrogate as well as nursing, discussions with consultants, evaluation of patient's response to treatment, examination of patient, obtaining history from patient or surrogate, ordering and performing treatments and interventions, ordering and review of laboratory studies, ordering and review of radiographic studies, pulse oximetry and re-evaluation of patient's condition.  Medications Ordered in ED Medications  piperacillin-tazobactam (ZOSYN) IVPB 3.375 g (has no administration in time range)  vancomycin (VANCOCIN) 2,000 mg in sodium chloride 0.9 % 500 mL IVPB (has no administration in time range)     Initial Impression / Assessment and Plan / ED Course  I have reviewed the triage vital signs and the nursing notes.  Pertinent labs & imaging results that were available during my care of the patient were reviewed by me and considered in my medical decision making (see chart for details).  Sepsis, possible aspiration.  Old records are reviewed, showing he had been admitted on May 16 for  sepsis in setting of neutropenia, CBC on May 24 was 6.1 with absolute neutrophil count of 5.4.  Sepsis work-up is initiated and he is started empirically on antibiotics.  Of note, he was full CODE STATUS during recent hospitalization.  Chest x-ray shows no new infiltrates.  Lactic acid level is come back normal.  Anemia is noted, slightly improved  from discharge.  Renal function is stable.  WBC is adequate, he does not need to be treated as a neutropenic fever.  Case is discussed with Dr. Maylene Roes of [facility], who requests CT angiogram of the chest be obtained to look for possible pulmonary embolism, possible occult pneumonia.  Final Clinical Impressions(s) / ED Diagnoses   Final diagnoses:  Fever, unspecified fever cause  Hypoxia  Normochromic normocytic anemia  Squamous cell cancer of tongue (HCC)  Atrial fibrillation, chronic [facility])    ED Discharge Orders    None       Delora Fuel, MD [birth year]79 [phone]

## 2018-03-03 NOTE — ED Notes (Signed)
ED TO INPATIENT HANDOFF REPORT  Name/Age/Gender Edgar Herrera 72 y.o. male  Code Status Code Status History    Date Active Date Inactive Code Status Order ID Comments User Context   02/21/2018 4128 03/01/2018 1732 Full Code [id number]  Cristy Folks, MD Inpatient      Home/SNF/Other Home  Chief Complaint Septic  Level of Care/Admitting Diagnosis ED Disposition    ED Disposition Condition [place] Hospital Area: [facility] [100102]  Level of Care: Stepdown [14]  Admit to SDU based on following criteria: Respiratory Distress:  Frequent assessment and/or intervention to maintain adequate ventilation/respiration, pulmonary toilet, and respiratory treatment.  Diagnosis: Aspiration pneumonia [facility]) [[id number]]  Admitting Physician: Dessa Phi [[id number]]  Attending Physician: Dessa Phi [phone]  Estimated length of stay: past midnight tomorrow  Certification:: I certify this patient will need inpatient services for at least 2 midnights  PT Class (Do Not Modify): Inpatient [101]  PT Acc Code (Do Not Modify): Private [1]       Medical History Past Medical History:  Diagnosis Date  . Allergic rhinitis   . Atrial fibrillation ([place])   . Diabetes mellitus without complication ([place])   . HLD (hyperlipidemia) 02/21/2018  . Hyperlipidemia   . Hypertension   . Prostate cancer ([place])   . T2DM (type 2 diabetes mellitus) ([place]) 02/21/2018    Allergies No Known Allergies  IV Location/Drains/Wounds Patient Lines/Drains/Airways Status   Active Line/Drains/Airways    Name:   Placement date:   Placement time:   Site:   Days:   Implanted Port 01/28/18 Left Chest   01/28/18    1242    Chest   34   Peripheral IV Left Forearm   -    -    Forearm      Gastrostomy/Enterostomy Percutaneous endoscopic gastrostomy (PEG);Other (Comment) LUQ   -    -    LUQ             Labs/Imaging Results for orders placed or performed during the hospital encounter of 03/03/18  (from the past 48 hour(s))  Comprehensive metabolic panel     Status: Abnormal   Collection Time: 03/03/18  6:56 AM  Result Value Ref Range   Sodium 136 135 - 145 mmol/L   Potassium 4.4 3.5 - 5.1 mmol/L   Chloride 95 (L) 101 - 111 mmol/L   CO2 31 22 - 32 mmol/L   Glucose, Bld 227 (H) 65 - 99 mg/dL   BUN 24 (H) 6 - 20 mg/dL   Creatinine, Ser 0.86 0.61 - 1.24 mg/dL   Calcium 8.3 (L) 8.9 - 10.3 mg/dL   Total Protein 6.3 (L) 6.5 - 8.1 g/dL   Albumin 2.7 (L) 3.5 - 5.0 g/dL   AST 33 15 - 41 U/L   ALT 68 (H) 17 - 63 U/L   Alkaline Phosphatase 72 38 - 126 U/L   Total Bilirubin 0.2 (L) 0.3 - 1.2 mg/dL   GFR calc non Af Amer >60 >60 mL/min   GFR calc Af Amer >60 >60 mL/min    Comment: (NOTE) The eGFR has been calculated using the CKD EPI equation. This calculation has not been validated in all clinical situations. eGFR's persistently <60 mL/min signify possible Chronic Kidney Disease.    Anion gap 10 5 - 15    Comment: Performed at [facility], [place] [address] [place], [place] [address]  CBC with Differential     Status: Abnormal   Collection Time:  03/03/18  6:56 AM  Result Value Ref Range   WBC 4.3 4.0 - 10.5 K/uL   RBC 2.99 (L) 4.22 - 5.81 MIL/uL   Hemoglobin 8.5 (L) 13.0 - 17.0 g/dL   HCT 27.0 (L) 39.0 - 52.0 %   MCV 90.3 78.0 - 100.0 fL   MCH 28.4 26.0 - 34.0 pg   MCHC 31.5 30.0 - 36.0 g/dL   RDW 16.6 (H) 11.5 - 15.5 %   Platelets 170 150 - 400 K/uL   Neutrophils Relative % 90 %   Lymphocytes Relative 3 %   Monocytes Relative 4 %   Eosinophils Relative 0 %   Basophils Relative 0 %   Band Neutrophils 3 %   Metamyelocytes Relative 0 %   Myelocytes 0 %   Promyelocytes Relative 0 %   Blasts 0 %   nRBC 0 0 /100 WBC   Neutro Abs 4.0 1.7 - 7.7 K/uL   Lymphs Abs 0.1 (L) 0.7 - 4.0 K/uL   Monocytes Absolute 0.2 0.1 - 1.0 K/uL   Eosinophils Absolute 0.0 0.0 - 0.7 K/uL   Basophils Absolute 0.0 0.0 - 0.1 K/uL   RBC Morphology POLYCHROMASIA PRESENT    WBC  Morphology DOHLE BODIES     Comment: Performed at [facility], [place] [address] [place], [place] [address]  Protime-INR     Status: Abnormal   Collection Time: 03/03/18  6:56 AM  Result Value Ref Range   Prothrombin Time 15.3 (H) 11.4 - 15.2 seconds   INR 1.22     Comment: Performed at [facility], [place] [address] [place], [place] [address]  I-Stat CG4 Lactic Acid, ED     Status: None   Collection Time: 03/03/18  7:04 AM  Result Value Ref Range   Lactic Acid, Venous 1.04 0.5 - 1.9 mmol/L   Ct Angio Chest Pe W And/or Wo Contrast  Result Date: 03/03/2018 CLINICAL DATA:  Hypoxia. EXAM: CT ANGIOGRAPHY CHEST WITH CONTRAST TECHNIQUE: Multidetector CT imaging of the chest was performed using the standard protocol during bolus administration of intravenous contrast. Multiplanar CT image reconstructions and MIPs were obtained to evaluate the vascular anatomy. CONTRAST:  51m ISOVUE-370 IOPAMIDOL (ISOVUE-370) INJECTION 76% COMPARISON:  Radiograph of same day. FINDINGS: Cardiovascular: Satisfactory opacification of the pulmonary arteries to the segmental level. No evidence of pulmonary embolism. Normal heart size. No pericardial effusion. Mediastinum/Nodes: No enlarged mediastinal, hilar, or axillary lymph nodes. Thyroid gland, trachea, and esophagus demonstrate no significant findings. Lungs/Pleura: No pneumothorax or pleural effusion is noted. Mild left basilar atelectasis is noted. Mild atelectasis or possibly pneumonia is noted in superior segment of right lower lobe. Mild right upper lobe subsegmental atelectasis or inflammation is noted. Upper Abdomen: Large solitary gallstone is noted without inflammation. Gastrostomy tube is in grossly good position. Musculoskeletal: No chest wall abnormality. No acute or significant osseous findings. Review of the MIP images confirms the above findings. IMPRESSION: No definite evidence of pulmonary embolus. Atelectasis or pneumonia  is noted in superior segment of right lower lobe. Mild right upper lobe subsegmental atelectasis or pneumonia is noted. Mild left basilar subsegmental atelectasis is noted. Large solitary gallstone is noted. Gastrostomy tube in grossly good position. Electronically Signed   By: JMarijo Conception M.D.   On: 03/03/2018 09:17   Dg Chest Port 1 View  Result Date: 03/03/2018 CLINICAL DATA:  Fevers. EXAM: PORTABLE CHEST 1 VIEW COMPARISON:  02/21/2018 FINDINGS: Left chest wall port a catheter is noted with tip in  the right atrium. Unchanged. There is cardiac enlargement. No pleural effusion or edema. No airspace opacities. IMPRESSION: 1. No acute cardiopulmonary abnormalities. Electronically Signed   By: Kerby Moors M.D.   On: 03/03/2018 07:49    Pending Labs Unresulted Labs (From admission, onward)   Start     Ordered   03/03/18 2026  Culture, blood (Routine x 2)  BLOOD CULTURE X 2,   STAT     03/03/18 6916   03/03/18 0643  Urinalysis, Routine w reflex microscopic  STAT,   STAT     03/03/18 7561   Signed and Held  CBC  Tomorrow morning,   R     Signed and Held   Signed and Held  Basic metabolic panel  Tomorrow morning,   R     Signed and Held      Vitals/Pain Today's Vitals   03/03/18 0800 03/03/18 0830 03/03/18 0900 03/03/18 0930  BP: 98/60 (!) 86/60 98/61 (!) 117/59  Pulse: (!) 119 (!) 117 (!) 113 (!) 109  Resp: (!) 9 (!) _0 Temp:      TempSrc:      SpO2: 100% 100% 100% 100%  Weight:      Height:      PainSc:        Isolation Precautions No active isolations  Medications Medications  iopamidol (ISOVUE-370) 76 % injection (has no administration in time range)  piperacillin-tazobactam (ZOSYN) IVPB 3.375 g (0 g Intravenous Stopped 03/03/18 0834)  vancomycin (VANCOCIN) 2,000 mg in sodium chloride 0.9 % 500 mL IVPB (0 mg Intravenous Stopped 03/03/18 1001)  sodium chloride 0.9 % bolus 1,000 mL (0 mLs Intravenous Stopped 03/03/18 1001)  iopamidol (ISOVUE-370) 76 % injection 100  mL (73 mLs Intravenous Contrast Given 03/03/18 0844)    Mobility non-ambulatory

## 2018-03-03 NOTE — Progress Notes (Signed)
A consult was received from an ED physician for zosyn and vancomycin  per pharmacy dosing.  The patient's profile has been reviewed for ht/wt/allergies/indication/available labs.   A one time order has been placed for zosyn 3.375 Gm and Vancomycin 2 Gm.  Further antibiotics/pharmacy consults should be ordered by admitting physician if indicated.                       Thank you, Dorrene German 03/03/2018  6:59 AM

## 2018-03-03 NOTE — Progress Notes (Signed)
Pharmacy Antibiotic Note  Alessandro Griep is a 72 y.o. male admitted on 03/03/2018 with pneumonia.  Pharmacy has been consulted for Vancomycin & Zosyn dosing.  Plan: Vancomycin 2000mg  x1, then 1500mg  IV every q24 hours.  Goal trough 15-20 mcg/mL. Zosyn 3.375g IV q8h (4 hour infusion).  Height: 5\' 9"  (175.3 cm) Weight: 211 lb (95.7 kg) IBW/kg (Calculated) : 70.7  Temp (24hrs), Avg:102.4 F (39.1 C), Min:102.4 F (39.1 C), Max:102.4 F (39.1 C)  Recent Labs  Lab 02/25/18 0444 02/26/18 0533 02/27/18 0500 02/28/18 0403 03/01/18 0500 03/03/18 0656 03/03/18 0704  WBC 1.3* 2.3* 2.0* 3.9* 6.1 4.3  --   CREATININE 0.71 0.72 0.74 0.80  --  0.86  --   LATICACIDVEN  --   --   --   --   --   --  1.04    Estimated Creatinine Clearance: 87.3 mL/min (by C-G formula based on SCr of 0.86 mg/dL).    No Known Allergies  Antimicrobials this admission: 5/26 Vancomycin >>  5/26 Zosyn >>   Dose adjustments this admission:  Microbiology results: 5/26 BCx: sent   Thank you for allowing pharmacy to be a part of this patient's care.  Minda Ditto 03/03/2018 10:37 AM

## 2018-03-03 NOTE — ED Triage Notes (Signed)
Pt comes to ed, via ems, respiratory distress cancer pt, septic fever and aspiration. Pt on 10 liters. V/s on arrival bp 138/64, pulse 133, 95 spo2 on 10 liters.  cbg 178.  On ems arrival pt oxygen level was de stating in 50 percent. Pt is alert but non verbal Temp 100.2 temporal. GCS score of 8

## 2018-03-03 NOTE — Progress Notes (Signed)
Brief Nutrition Note  Consult received for enteral/tube feeding initiation and management.  Adult Enteral Nutrition Protocol initiated with Jevity 1.5 @ 20 ml/hr d/t admission for possible aspiration.  Full assessment to follow.  Admitting Dx: Hypoxia [R09.02] Squamous cell cancer of tongue (HCC) [C02.9] Normochromic normocytic anemia [D64.9] Atrial fibrillation, chronic (HCC) [I48.2] Fever, unspecified fever cause [R50.9]  Body mass index is 30.83 kg/m. Pt meets criteria for obesity based on current BMI.  Labs:  Recent Labs  Lab 02/27/18 0500 02/28/18 0403 03/03/18 0656  NA 135 136 136  K 3.9 3.9 4.4  CL 94* 93* 95*  CO2 31 31 31   BUN 22* 23* 24*  CREATININE 0.74 0.80 0.86  CALCIUM 8.3* 8.3* 8.3*  MG 1.7  --   --   GLUCOSE 271* 261* 227*    Edgar Bibles, MS, RD, LDN [facility] Clinical Dietitian Pager: [phone] After Hours Pager: [phone]

## 2018-03-04 ENCOUNTER — Inpatient Hospital Stay (HOSPITAL_COMMUNITY): Payer: Medicare Other

## 2018-03-04 DIAGNOSIS — J9601 Acute respiratory failure with hypoxia: Secondary | ICD-10-CM | POA: Diagnosis not present

## 2018-03-04 LAB — CBC
HEMATOCRIT: 24.1 % — AB (ref 39.0–52.0)
HEMOGLOBIN: 7.9 g/dL — AB (ref 13.0–17.0)
MCH: 29.5 pg (ref 26.0–34.0)
MCHC: 32.8 g/dL (ref 30.0–36.0)
MCV: 89.9 fL (ref 78.0–100.0)
Platelets: 163 10*3/uL (ref 150–400)
RBC: 2.68 MIL/uL — ABNORMAL LOW (ref 4.22–5.81)
RDW: 17.2 % — ABNORMAL HIGH (ref 11.5–15.5)
WBC: 3.1 10*3/uL — ABNORMAL LOW (ref 4.0–10.5)

## 2018-03-04 LAB — GLUCOSE, CAPILLARY
GLUCOSE-CAPILLARY: 234 mg/dL — AB (ref 65–99)
GLUCOSE-CAPILLARY: 218 mg/dL — AB (ref 65–99)
Glucose-Capillary: 273 mg/dL — ABNORMAL HIGH (ref 65–99)
Glucose-Capillary: 328 mg/dL — ABNORMAL HIGH (ref 65–99)
Glucose-Capillary: 238 mg/dL — ABNORMAL HIGH (ref 65–99)

## 2018-03-04 LAB — BASIC METABOLIC PANEL
Anion gap: 11 (ref 5–15)
BUN: 19 mg/dL (ref 6–20)
CHLORIDE: 96 mmol/L — AB (ref 101–111)
CO2: 29 mmol/L (ref 22–32)
CREATININE: 0.83 mg/dL (ref 0.61–1.24)
Calcium: 8.2 mg/dL — ABNORMAL LOW (ref 8.9–10.3)
GFR calc Af Amer: >60 mL/min (ref >60)
GFR calc non Af Amer: >60 mL/min (ref >60)
Glucose, Bld: 212 mg/dL — ABNORMAL HIGH (ref 65–99)
POTASSIUM: 3.9 mmol/L (ref 3.5–5.1)
Sodium: 136 mmol/L (ref 135–145)

## 2018-03-04 MED ORDER — SODIUM CHLORIDE 0.9% FLUSH
10.0000 mL | INTRAVENOUS | Status: DC | PRN
  Administered 2018-03-04 – 2018-03-08 (×2): 10 mL
  Filled 2018-03-04 (×2): qty 40

## 2018-03-04 MED ORDER — ALBUTEROL SULFATE (2.5 MG/3ML) 0.083% IN NEBU
2.5000 mg | INHALATION_SOLUTION | Freq: Four times a day (QID) | RESPIRATORY_TRACT | Status: DC
  Administered 2018-03-04: 2.5 mg via RESPIRATORY_TRACT
  Filled 2018-03-04: qty 3

## 2018-03-04 MED ORDER — METHYLPREDNISOLONE SODIUM SUCC 40 MG IJ SOLR
40.0000 mg | Freq: Two times a day (BID) | INTRAMUSCULAR | Status: AC
  Administered 2018-03-04 – 2018-03-06 (×6): 40 mg via INTRAVENOUS
  Filled 2018-03-04 (×6): qty 1

## 2018-03-04 MED ORDER — SODIUM CHLORIDE 0.9% FLUSH
10.0000 mL | Freq: Two times a day (BID) | INTRAVENOUS | Status: DC
  Administered 2018-03-04: 20 mL
  Administered 2018-03-04 – 2018-03-06 (×4): 10 mL

## 2018-03-04 MED ORDER — CHLORHEXIDINE GLUCONATE CLOTH 2 % EX PADS
6.0000 | MEDICATED_PAD | Freq: Every day | CUTANEOUS | Status: DC
  Administered 2018-03-04 – 2018-03-08 (×4): 6 via TOPICAL

## 2018-03-04 MED ORDER — ALBUTEROL SULFATE (2.5 MG/3ML) 0.083% IN NEBU: 2.5000 mg | INHALATION_SOLUTION | Freq: Four times a day (QID) | RESPIRATORY_TRACT | Status: DC | PRN

## 2018-03-04 MED ORDER — ALBUTEROL SULFATE (2.5 MG/3ML) 0.083% IN NEBU
INHALATION_SOLUTION | RESPIRATORY_TRACT | Status: AC
  Filled 2018-03-04: qty 3

## 2018-03-04 NOTE — Progress Notes (Signed)
eLink Physician-Brief Progress Note Patient Name: Edgar Herrera DOB: 29-Jan-1946 MRN: [id number]   Date of Service  03/04/2018  HPI/Events of Note  72 yr old male with throat ca admitted with pneumonia.  Asked to see for hypoxia.  Patient not in distress but needing NRB mask to maintain saturation.  Also patient with Afib HR 130.  He has hx of PAF.  BP 130/72  eICU Interventions  Stat cxr Scheduled nebs and CPT IV steroids for airway inflammation     Intervention Category Major Interventions: Hypoxemia - evaluation and management  Edgar Herrera, Edgar Herrera, P 03/04/2018, 2:52 AM

## 2018-03-04 NOTE — Progress Notes (Signed)
PROGRESS NOTE    Edgar Herrera  ZJI:[id number] DOB: 1946/08/23 DOA: 03/03/2018 PCP: [facility]    Brief Narrative:  72 y.o. male with medical history significant of paroxysmal atrial fibrillation, type 2 diabetes, hyperlipidemia, hypertension, recent diagnosis of squamous cell cancer of the throat, PEG tube, mostly nonverbal at baseline who was recently discharged from the hospital after neutropenic fever.  Pt presented with acute hypoxemic respiratory failure and dx with aspiration pna.   Assessment & Plan:   Principal Problem:   Acute hypoxemic respiratory failure (HCC) -  Pulse ox reportedly in the 50's, has resolved and patient stable after oxygen supplementation and deep suctioning.  Aspiration pneumonia ([place]) /Sepsis ([place]) - Pt on zosyn and vancomycin will continue for now. Narrow with improvement in condition. 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  Objective: Vitals:   03/04/18 0335 03/04/18 0400 03/04/18 0730 03/04/18 0800  BP:  (!)  91/59  (!) 122/51  Pulse:  (!) 109  (!) 105  Resp:  12  10  Temp:  98 F (36.7 C) 97.6 F (36.4 C)   TempSrc:  Axillary Axillary   SpO2:  99%  100%  Weight: 93.3 kg (205 lb 11 oz)     Height:        Intake/Output Summary (Last 24 hours) at 03/04/2018 1016 Last data filed at 03/04/2018 0900 Gross per 24 hour  Intake 910 ml  Output 1100 ml  Net -190 ml   Filed Weights   03/03/18 0720 03/03/18 1106 03/04/18 0335  Weight: 95.7 kg (211 lb) 94.7 kg (208 lb 12.4 oz) 93.3 kg (205 lb 11 oz)    Examination:  General exam: Appears calm and comfortable, in nad. Respiratory system: rhales, no wheezes, equal chest rise. Cardiovascular system: S1 & S2 heard, RRR. No JVD, murmurs, rubs, gallops or clicks. No pedal edema. Gastrointestinal system: Abdomen is nondistended, soft and nontender. No organomegaly or masses felt. Normal bowel sounds heard. Peg tube in place Central nervous system: Alert and oriented. No focal neurological deficits. Extremities: Symmetric 5 x 5 power. Skin: No rashes, lesions or ulcers Psychiatry: Mood & affect appropriate.    Data Reviewed: I have personally reviewed following labs and imaging studies  CBC: Recent Labs  Lab 02/26/18 0533 02/27/18 0500 02/28/18 0403 03/01/18 0500 03/03/18 0656 03/04/18 0351  WBC 2.3* 2.0* 3.9* 6.1 4.3 3.1*  NEUTROABS 1.8 1.5* 3.3 5.4 4.0  --   HGB 7.9* 8.0* 7.6* 7.7* 8.5* 7.9*  HCT 23.9* 24.2* 23.7* 23.7* 27.0* 24.1*  MCV 87.9 87.4 88.1 88.8 90.3 89.9  PLT 120* 120* 121*  137* 170 702   Basic Metabolic Panel: Recent Labs  Lab 02/26/18 0533 02/27/18 0500 02/28/18 0403 03/03/18 0656 03/04/18 0351  NA  --  135 136 136 136  K  --  3.9 3.9 4.4 3.9  CL  --  94* 93* 95* 96*  CO2  --  31 31 31 29   GLUCOSE  --  271* 261* 227* 212*  BUN  --  22* 23* 24* 19  CREATININE 0.72 0.74 0.80 0.86 0.83  CALCIUM  --  8.3* 8.3* 8.3* 8.2*  MG  --  1.7  --   --   --    GFR: Estimated Creatinine Clearance: 89.4 mL/min (by C-G  formula based on SCr of 0.83 mg/dL). Liver Function Tests: Recent Labs  Lab 03/03/18 0656  AST 33  ALT 68*  ALKPHOS 72  BILITOT 0.2*  PROT 6.3*  ALBUMIN 2.7*   No results for input(s): LIPASE, AMYLASE in the last 168 hours. No results for input(s): AMMONIA in the last 168 hours. Coagulation Profile: Recent Labs  Lab 03/03/18 0656  INR 1.22   Cardiac Enzymes: No results for input(s): CKTOTAL, CKMB, CKMBINDEX, TROPONINI in the last 168 hours. BNP (last 3 results) No results for input(s): PROBNP in the last 8760 hours. HbA1C: No results for input(s): HGBA1C in the last 72 hours. CBG: Recent Labs  Lab 03/03/18 1310 03/03/18 1651 03/03/18 2004 03/03/18 2314 03/04/18 0726  GLUCAP 159* 117* 125* 147* 273*   Lipid Profile: No results for input(s): CHOL, HDL, LDLCALC, TRIG, CHOLHDL, LDLDIRECT in the last 72 hours. Thyroid Function Tests: No results for input(s): TSH, T4TOTAL, FREET4, T3FREE, THYROIDAB in the last 72 hours. Anemia Panel: No results for input(s): VITAMINB12, FOLATE, FERRITIN, TIBC, IRON, RETICCTPCT in the last 72 hours. Sepsis Labs: Recent Labs  Lab 03/03/18 0704  LATICACIDVEN 1.04    Recent Results (from the past 240 hour(s))  Culture, blood (x 2)     Status: None   Collection Time: 02/24/18 10:05 AM  Result Value Ref Range Status   Specimen Description   Final    BLOOD RIGHT ANTECUBITAL Performed at [facility] [address], [place], [place] [address]    Special Requests   Final    BOTTLES DRAWN AEROBIC AND ANAEROBIC Blood Culture adequate volume Performed at [facility] [address] [place], [place] [address]    Culture   Final    NO GROWTH 5 DAYS Performed at [facility], [place] [address] [place], [place] [address]    Report Status 03/01/2018 FINAL  Final  Culture, blood (x 2)     Status: None   Collection Time: 02/24/18 10:06 AM  Result Value Ref Range Status   Specimen Description    Final    BLOOD LEFT ANTECUBITAL Performed at [facility] [address], [place], [place] [address]    Special Requests   Final    BOTTLES DRAWN AEROBIC ONLY Blood Culture adequate volume Performed at [facility] [address] [place], [place] [address]    Culture   Final    NO GROWTH 5 DAYS Performed at [facility], [place] [address] [place],  [address]    Report Status 03/01/2018 FINAL  Final  MRSA PCR Screening     Status: None   Collection Time: 03/03/18 11:13 AM  Result Value Ref Range Status   MRSA by PCR NEGATIVE NEGATIVE Final    Comment:  The GeneXpert MRSA Assay (FDA approved for NASAL specimens only), is one component of a comprehensive MRSA colonization surveillance program. It is not intended to diagnose MRSA infection nor to guide or monitor treatment for MRSA infections. Performed at [facility], [place] [address], [place], [place] [address]          Radiology Studies: Ct Angio Chest Pe W And/or Wo Contrast  Result Date: 03/03/2018 CLINICAL DATA:  Hypoxia. EXAM: CT ANGIOGRAPHY CHEST WITH CONTRAST TECHNIQUE: Multidetector CT imaging of the chest was performed using the standard protocol during bolus administration of intravenous contrast. Multiplanar CT image reconstructions and MIPs were obtained to evaluate the vascular anatomy. CONTRAST:  57mL ISOVUE-370 IOPAMIDOL (ISOVUE-370) INJECTION 76% COMPARISON:  Radiograph of same day. FINDINGS: Cardiovascular: Satisfactory opacification of the pulmonary arteries to the segmental level. No evidence of pulmonary embolism. Normal heart size. No pericardial effusion. Mediastinum/Nodes: No enlarged mediastinal, hilar, or axillary lymph nodes. Thyroid gland, trachea, and esophagus demonstrate no significant findings. Lungs/Pleura: No pneumothorax or pleural effusion is noted. Mild left basilar atelectasis is noted. Mild atelectasis or possibly  pneumonia is noted in superior segment of right lower lobe. Mild right upper lobe subsegmental atelectasis or inflammation is noted. Upper Abdomen: Large solitary gallstone is noted without inflammation. Gastrostomy tube is in grossly good position. Musculoskeletal: No chest wall abnormality. No acute or significant osseous findings. Review of the MIP images confirms the above findings. IMPRESSION: No definite evidence of pulmonary embolus. Atelectasis or pneumonia is noted in superior segment of right lower lobe. Mild right upper lobe subsegmental atelectasis or pneumonia is noted. Mild left basilar subsegmental atelectasis is noted. Large solitary gallstone is noted. Gastrostomy tube in grossly good position. Electronically Signed   By: Marijo Conception, M.D.   On: 03/03/2018 09:17   Dg Chest Port 1 View  Result Date: 03/04/2018 CLINICAL DATA:  Shortness of breath EXAM: PORTABLE CHEST 1 VIEW COMPARISON:  03/03/2018 FINDINGS: Shallow inspiration. Cardiac enlargement. No vascular congestion, edema, or consolidation. No blunting of costophrenic angles. No pneumothorax. Power port type central venous catheter with tip over the cavoatrial junction region. Tortuous aorta. IMPRESSION: Shallow inspiration. Cardiac enlargement. No active pulmonary disease. Electronically Signed   By: Lucienne Capers M.D.   On: 03/04/2018 03:25   Dg Chest Port 1 View  Result Date: 03/03/2018 CLINICAL DATA:  Fevers. EXAM: PORTABLE CHEST 1 VIEW COMPARISON:  02/21/2018 FINDINGS: Left chest wall port a catheter is noted with tip in the right atrium. Unchanged. There is cardiac enlargement. No pleural effusion or edema. No airspace opacities. IMPRESSION: 1. No acute cardiopulmonary abnormalities. Electronically Signed   By: Kerby Moors M.D.   On: 03/03/2018 07:49        Scheduled Meds: . allopurinol  100 mg Per Tube Daily  . amLODipine  10 mg Per Tube Daily  . apixaban  5 mg Per Tube BID  . chlorhexidine  15 mL Mouth Rinse  BID  . Chlorhexidine Gluconate Cloth  6 each Topical Daily  . feeding supplement (JEVITY 1.5 CAL/FIBER)  1,000 mL Per Tube Q24H  . free water  60 mL Per Tube 5 X Daily  . insulin aspart  0-9 Units Subcutaneous Q4H  . mouth rinse  15 mL Mouth Rinse q12n4p  . methylPREDNISolone (SOLU-MEDROL) injection  40 mg Intravenous BID  . polyvinyl alcohol  1 drop Both Eyes Daily  . pravastatin  40 mg Per Tube Daily  . sodium chloride flush  10-40 mL Intracatheter Q12H  . vitamin  B-12  500 mcg Per Tube BID   Continuous Infusions: . piperacillin-tazobactam (ZOSYN)  IV 3.375 g (03/04/18 0828)  . vancomycin 1,500 mg (03/04/18 0831)     LOS: 1 day    Time spent: 75 min    Velvet Bathe, MD [facility] Pager [phone]  If 7PM-7AM, please contact night-coverage [email or web address] Password [facility] 03/04/2018, 10:16 AM

## 2018-03-04 NOTE — Progress Notes (Signed)
[facility] Follow-up Visit:  Assessment: Squamous cell carcinoma of base of tongue ([place]) 72 y.o.  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  Agree with the current clinical stage II based on clinical T3, clinical N1 disease.  Based on the age and comorbidities, patient is not a candidate for cisplatin chemotherapy, but will likely be able to tolerate concurrent weekly carboplatin/paclitaxel combination administered with radiotherapy.  Patient presents to the clinic to continue systemic therapy therapy.  Appears to be tolerating infusion well so far.  Increasing amount of pain consistent with progressive mucositis from radiation.  Clinical evaluation lab work-up permissive to proceed with the next cycle of systemic chemotherapy.  Plan: -Proceed with systemic therapy with carboplatin/paclitaxel, week #5 on 02/15/18. -Continue liquid morphine for pain control -Return to clinic in 1 week: Labs, possible next dose of systemic chemotherapy.   Voice recognition software was used and creation of this note. Despite my best effort at editing the text, some misspelling/errors may have occurred.  No orders of the defined types were placed in this encounter.   Cancer Staging Squamous cell carcinoma of base of tongue (HCC) Staging form: Pharynx - HPV-Mediated Oropharynx, AJCC 8th Edition - Clinical: Stage II (cT3, cN1, cM0, p16+) - Signed by [name], MD on 01/01/2018   All questions were answered.  . The patient knows to call the clinic with any problems, questions or concerns.  This note was electronically signed.    History of Presenting Illness Edgar Herrera is a 72 y.o. male followed in the [place] for diagnosis of squamous cell carcinoma of tongue base, referred by Dr. [name]. Patient initially presented to a [place] emergency department 11/29/17 with complaints of intermittent dysphasia  and hemoptysis episode.  CT scan of the neck obtained at the time demonstrated a large exophytic mass. Patient was seen by Dr.S Isac Caddy at [facility] on 12/10/17 and underwent a fiberoptic laryngoscopy on 12/17/17.  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  Additional staging imaging was obtained with PET/CT demonstrating no evidence of distant metastatic disease, but Lt base of the tongue mass, ipsilateral adenopathy.  Patient was evaluated by [facility] for possible tors procedure and was not found to be a suitable candidate according to Dr. Conley Canal.  Patient was subsequently evaluated by Dr. [name] who has referred the patient to our clinic for possible concurrent multimodality therapy.  Patient presents to the clinic today to continue systemic component of his treatment with carboplatin and paclitaxel.  No recurrent febrile episodes since the last visit to the clinic.  No new complaints.  Oncological/hematological History:   Squamous cell carcinoma of base of tongue ([place])   12/17/2017 Initial Diagnosis    Squamous cell carcinoma of base of tongue ([place]):  -- laryngoscopic biopsy of the mass at the base of the tongue base positive for invasive poorly differentiated carcinoma positive for p40 and p16      01/01/2018 Cancer Staging    Staging form: Pharynx - HPV-Mediated Oropharynx, AJCC 8th Edition - Clinical: Stage II (cT3, cN1, cM0, p16+) - Signed by [name], MD on 01/01/2018       01/16/2018 -  Radiation Therapy    Dr [name]:      01/18/2018 -  Chemotherapy    Carboplatin AUC 2, d1 + paclitaxel 34m/m2, d1 QWk --Week #1, 01/18/18: --Week #2, 01/24/18: --  Week #3, 02/01/18: --Week #4, 02/08/18:       Medical History: Past Medical History:  Diagnosis Date  . Allergic rhinitis   . Atrial fibrillation ([place])   . Diabetes mellitus without complication  ([place])   . HLD (hyperlipidemia) 02/21/2018  . Hyperlipidemia   . Hypertension   . Prostate cancer ([employer])   . T2DM (type 2 diabetes mellitus) ([place]) 02/21/2018    Surgical History: Past Surgical History:  Procedure Laterality Date  . KNEE SURGERY Bilateral    2007 and 1998,   . PROSTATECTOMY  12/15/2006  . SKIN GRAFT     as a child left elbow  . TONSILECTOMY/ADENOIDECTOMY WITH MYRINGOTOMY     as a child    Family History: Family History  Problem Relation Age of Onset  . Lung cancer Mother   . Hypertension Mother   . Breast cancer Mother   . Heart attack Father   . Hypertension Sister   . Hypertension Brother   . Hypertension Sister     Social History: Social History   Socioeconomic History  . Marital status: Married    Spouse name: Not on file  . Number of children: Not on file  . Years of education: Not on file  . Highest education level: Not on file  Occupational History  . Not on file  Social Needs  . Financial resource strain: Not on file  . Food insecurity:    Worry: Not on file    Inability: Not on file  . Transportation needs:    Medical: Not on file    Non-medical: Not on file  Tobacco Use  . Smoking status: Never Smoker  . Smokeless tobacco: Never Used  Substance and Sexual Activity  . Alcohol use: Yes    Comment: reports occasional beer or wine. maybe two times monthly  . Drug use: No  . Sexual activity: Not on file  Lifestyle  . Physical activity:    Days per week: Not on file    Minutes per session: Not on file  . Stress: Not on file  Relationships  . Social connections:    Talks on phone: Not on file    Gets together: Not on file    Attends religious service: Not on file    Active member of club or organization: Not on file    Attends meetings of clubs or organizations: Not on file    Relationship status: Not on file  . Intimate partner violence:    Fear of current or ex partner: Not on file    Emotionally abused: Not on file     Physically abused: Not on file    Forced sexual activity: Not on file  Other Topics Concern  . Not on file  Social History Narrative  . Not on file    Allergies: No Known Allergies  Medications:  No current facility-administered medications for this visit.    No current outpatient medications on file.   Facility-Administered Medications Ordered in Other Visits  Medication Dose Route Frequency Provider Last Rate Last Dose  . acetaminophen (TYLENOL) tablet 650 mg  650 mg Per Tube Q6H PRN Dessa Phi, DO       Or  . acetaminophen (TYLENOL) suppository 650 mg  650 mg Rectal Q6H PRN Dessa Phi, DO      . albuterol (PROVENTIL) (2.5 MG/3ML) 0.083% nebulizer solution 2.5 mg  2.5 mg Nebulization Q6H PRN Velvet Bathe, MD      . allopurinol (ZYLOPRIM) tablet 100 mg  100 mg Per Tube Daily Dessa Phi, DO   100 mg at 03/04/18 0934  . amLODipine (NORVASC) tablet 10 mg  10 mg Per Tube Daily Dessa Phi, DO   10 mg at 03/04/18 0934  . apixaban (ELIQUIS) tablet 5 mg  5 mg Per Tube BID Dessa Phi, DO   5 mg at 03/04/18 2542  . chlorhexidine (PERIDEX) 0.12 % solution 15 mL  15 mL Mouth Rinse BID Dessa Phi, DO   15 mL at 03/04/18 0913  . Chlorhexidine Gluconate Cloth 2 % PADS 6 each  6 each Topical Daily Velvet Bathe, MD   6 each at 03/04/18 1155  . feeding supplement (JEVITY 1.5 CAL/FIBER) liquid 1,000 mL  1,000 mL Per Tube Q24H Dessa Phi, DO   1,000 mL at 03/03/18 1508  . free water 60 mL  60 mL Per Tube 5 X Daily Dessa Phi, DO   60 mL at 03/04/18 0912  . HYDROcodone-acetaminophen (HYCET) 7.5-325 mg/15 ml solution 10 mL  10 mL Per Tube QID PRN Dessa Phi, DO      . insulin aspart (novoLOG) injection 0-9 Units  0-9 Units Subcutaneous Q4H Dessa Phi, DO   7 Units at 03/04/18 1148  . LORazepam (ATIVAN) tablet 0.5 mg  0.5 mg Per Tube Q6H PRN Dessa Phi, DO      . MEDLINE mouth rinse  15 mL Mouth Rinse q12n4p Dessa Phi, DO   15 mL at 03/04/18 1155  .  methylPREDNISolone sodium succinate (SOLU-MEDROL) 40 mg/mL injection 40 mg  40 mg Intravenous BID Mauri Brooklyn, MD   40 mg at 03/04/18 0326  . metoprolol tartrate (LOPRESSOR) injection 2.5 mg  2.5 mg Intravenous Q5 min PRN Dessa Phi, DO   2.5 mg at 03/04/18 0352  . ondansetron (ZOFRAN) tablet 4 mg  4 mg Oral Q6H PRN Dessa Phi, DO       Or  . ondansetron [facility]) injection 4 mg  4 mg Intravenous Q6H PRN Dessa Phi, DO      . piperacillin-tazobactam (ZOSYN) IVPB 3.375 g  3.375 g Intravenous Q8H Green, Terri L, RPH 12.5 mL/hr at 03/04/18 0828 3.375 g at 03/04/18 0828  . polyethylene glycol (MIRALAX / GLYCOLAX) packet 17 g  17 g Per Tube Daily PRN Dessa Phi, DO      . polyvinyl alcohol (LIQUIFILM TEARS) 1.4 % ophthalmic solution 1 drop  1 drop Both Eyes Daily Dessa Phi, DO   1 drop at 03/04/18 0911  . pravastatin (PRAVACHOL) tablet 40 mg  40 mg Per Tube Daily Dessa Phi, DO   40 mg at 03/04/18 0936  . sodium chloride flush (NS) 0.9 % injection 10-40 mL  10-40 mL Intracatheter Q12H Velvet Bathe, MD   20 mL at 03/04/18 0911  . sodium chloride flush (NS) 0.9 % injection 10-40 mL  10-40 mL Intracatheter PRN Velvet Bathe, MD      . vancomycin (VANCOCIN) 1,500 mg in sodium chloride 0.9 % 500 mL IVPB  1,500 mg Intravenous Q24H Minda Ditto, RPH   Stopped at 03/04/18 1035  . vitamin B-12 (CYANOCOBALAMIN) tablet 500 mcg  500 mcg Per Tube BID Dessa Phi, DO   500 mcg at 03/04/18 0935    Review of Systems: Review of Systems  HENT:   Positive for mouth sores, sore throat and voice change.   Neurological: Positive for dizziness.  All other systems reviewed and are negative.    PHYSICAL EXAMINATION Blood pressure 127/60, pulse 78, temperature 98.8 F (37.1 C), temperature  source Oral, resp. rate 19, height _0  (1.753 m), weight 201 lb 9.6 oz (91.4 kg), SpO2 96 %.  ECOG PERFORMANCE STATUS: 1 - Symptomatic but completely ambulatory  Physical Exam  Constitutional: He is  oriented to person, place, and time. He appears well-developed and well-nourished. No distress.  HENT:  Head: Normocephalic and atraumatic.  Mouth/Throat: Oropharynx is clear and moist. No oropharyngeal exudate.  New fibrinous ulcer located at the posterolateral left tongue.  No active bleeding.  Eyes: Pupils are equal, round, and reactive to light. Conjunctivae and EOM are normal. No scleral icterus.  Neck: No thyromegaly present.  Cardiovascular: Normal rate, regular rhythm, normal heart sounds and intact distal pulses.  No murmur heard. Pulmonary/Chest: Effort normal and breath sounds normal. No stridor. No respiratory distress. He has no wheezes. He has no rales.  Abdominal: Soft. Bowel sounds are normal. He exhibits no distension and no mass. There is no tenderness. There is no guarding.  Musculoskeletal: He exhibits no edema.  Lymphadenopathy:    He has cervical adenopathy.  Neurological: He is alert and oriented to person, place, and time. He displays normal reflexes. No cranial nerve deficit or sensory deficit.  Skin: Skin is warm and dry. No rash noted. He is not diaphoretic. No erythema. No pallor.     LABORATORY DATA: I have personally reviewed the data as listed: Appointment on 02/12/2018  Component Date Value Ref Range Status  . Magnesium 02/12/2018 1.9  1.7 - 2.4 mg/dL Final   Performed at [facility], [place] [address], [place], [place] [address]  . Sodium 02/12/2018 139  136 - 145 mmol/L Final  . Potassium 02/12/2018 4.3  3.5 - 5.1 mmol/L Final  . Chloride 02/12/2018 99  98 - 109 mmol/L Final  . CO2 02/12/2018 34* 22 - 29 mmol/L Final  . Glucose, Bld 02/12/2018 86  70 - 140 mg/dL Final  . BUN 02/12/2018 29* 7 - 26 mg/dL Final  . Creatinine 02/12/2018 1.19  0.70 - 1.30 mg/dL Final  . Calcium 02/12/2018 9.9  8.4 - 10.4 mg/dL Final  . Total Protein 02/12/2018 7.1  6.4 - 8.3 g/dL Final  . Albumin 02/12/2018 3.5  3.5 - 5.0 g/dL Final  . AST  02/12/2018 26  5 - 34 U/L Final  . ALT 02/12/2018 32  0 - 55 U/L Final  . Alkaline Phosphatase 02/12/2018 70  40 - 150 U/L Final  . Total Bilirubin 02/12/2018 0.5  0.2 - 1.2 mg/dL Final  . GFR, Est Non Af Am 02/12/2018 59* >60 mL/min Final  . GFR, Est AFR Am 02/12/2018 >60  >60 mL/min Final   Comment: (NOTE) The eGFR has been calculated using the CKD EPI equation. This calculation has not been validated in all clinical situations. eGFR's persistently <60 mL/min signify possible Chronic Kidney Disease.   Georgiann Hahn gap 02/12/2018 6  3 - 11 Final   Performed at [facility], [place] [address], [place], [place] [address]  . WBC Count 02/12/2018 5.7  4.0 - 10.3 K/uL Final  . RBC 02/12/2018 4.37  4.20 - 5.82 MIL/uL Final  . Hemoglobin 02/12/2018 12.6* 13.0 - 17.1 g/dL Final  . HCT 02/12/2018 38.4  38.4 - 49.9 % Final  . MCV 02/12/2018 87.9  79.3 - 98.0 fL Final  . MCH 02/12/2018 28.8  27.2 - 33.4 pg Final  . MCHC 02/12/2018 32.8  32.0 - 36.0 g/dL Final  . RDW 02/12/2018 13.8  11.0 - 14.6 %  Final  . Platelet Count 02/12/2018 160  140 - 400 K/uL Final  . Neutrophils Relative % 02/12/2018 85  % Final  . Neutro Abs 02/12/2018 4.9  1.5 - 6.5 K/uL Final  . Lymphocytes Relative 02/12/2018 8  % Final  . Lymphs Abs 02/12/2018 0.5* 0.9 - 3.3 K/uL Final  . Monocytes Relative 02/12/2018 7  % Final  . Monocytes Absolute 02/12/2018 0.4  0.1 - 0.9 K/uL Final  . Eosinophils Relative 02/12/2018 0  % Final  . Eosinophils Absolute 02/12/2018 0.0  0.0 - 0.5 K/uL Final  . Basophils Relative 02/12/2018 0  % Final  . Basophils Absolute 02/12/2018 0.0  0.0 - 0.1 K/uL Final   Performed at [facility], [place] [address], [place], [place] [address]  Appointment on 02/08/2018  Component Date Value Ref Range Status  . WBC Count 02/08/2018 3.2* 4.0 - 10.3 K/uL Final  . RBC 02/08/2018 4.19* 4.20 - 5.82 MIL/uL Final  . Hemoglobin 02/08/2018 12.1* 13.0 - 17.1 g/dL Final  .  HCT 02/08/2018 36.2* 38.4 - 49.9 % Final  . MCV 02/08/2018 86.4  79.3 - 98.0 fL Final  . MCH 02/08/2018 28.9  27.2 - 33.4 pg Final  . MCHC 02/08/2018 33.4  32.0 - 36.0 g/dL Final  . RDW 02/08/2018 13.5  11.0 - 14.6 % Final  . Platelet Count 02/08/2018 162  140 - 400 K/uL Final  . Neutrophils Relative % 02/08/2018 70  % Final  . Neutro Abs 02/08/2018 2.3  1.5 - 6.5 K/uL Final  . Lymphocytes Relative 02/08/2018 14  % Final  . Lymphs Abs 02/08/2018 0.4* 0.9 - 3.3 K/uL Final  . Monocytes Relative 02/08/2018 15  % Final  . Monocytes Absolute 02/08/2018 0.5  0.1 - 0.9 K/uL Final  . Eosinophils Relative 02/08/2018 0  % Final  . Eosinophils Absolute 02/08/2018 0.0  0.0 - 0.5 K/uL Final  . Basophils Relative 02/08/2018 1  % Final  . Basophils Absolute 02/08/2018 0.0  0.0 - 0.1 K/uL Final   Performed at [facility], [place] [address], [place], [place] [address]  . Sodium 02/08/2018 136  136 - 145 mmol/L Final  . Potassium 02/08/2018 3.6  3.5 - 5.1 mmol/L Final  . Chloride 02/08/2018 97* 98 - 109 mmol/L Final  . CO2 02/08/2018 30* 22 - 29 mmol/L Final  . Glucose, Bld 02/08/2018 131  70 - 140 mg/dL Final  . BUN 02/08/2018 13  7 - 26 mg/dL Final  . Creatinine 02/08/2018 0.99  0.70 - 1.30 mg/dL Final  . Calcium 02/08/2018 9.6  8.4 - 10.4 mg/dL Final  . Total Protein 02/08/2018 7.1  6.4 - 8.3 g/dL Final  . Albumin 02/08/2018 3.2* 3.5 - 5.0 g/dL Final  . AST 02/08/2018 19  5 - 34 U/L Final  . ALT 02/08/2018 25  0 - 55 U/L Final  . Alkaline Phosphatase 02/08/2018 74  40 - 150 U/L Final  . Total Bilirubin 02/08/2018 0.5  0.2 - 1.2 mg/dL Final  . GFR, Est Non Af Am 02/08/2018 >60  >60 mL/min Final  . GFR, Est AFR Am 02/08/2018 >60  >60 mL/min Final   Comment: (NOTE) The eGFR has been calculated using the CKD EPI equation. This calculation has not been validated in all clinical situations. eGFR's persistently <60 mL/min signify possible Chronic Kidney Disease.   Georgiann Hahn gap  02/08/2018 9  3 - 11 Final   Performed at [facility], [address] [place], [place] [address]  . Magnesium 02/08/2018 1.7  1.7 - 2.4 mg/dL Final   Performed at [facility], [place] [address], [place], [place] [address]  . Phosphorus 02/08/2018 3.0  2.5 - 4.6 mg/dL Final   Performed at [place] [address] [place],  [address]       Ardath Sax, MD

## 2018-03-04 NOTE — Assessment & Plan Note (Signed)
72 y.o.  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  Agree with the current clinical stage II based on clinical T3, clinical N1 disease.  Based on the age and comorbidities, patient is not a candidate for cisplatin chemotherapy, but will likely be able to tolerate concurrent weekly carboplatin/paclitaxel combination administered with radiotherapy.  Patient presents to the clinic to continue systemic therapy therapy.  Appears to be tolerating infusion well so far.  Increasing amount of pain consistent with progressive mucositis from radiation.  Clinical evaluation lab work-up permissive to proceed with the next cycle of systemic chemotherapy.  Plan: -Proceed with systemic therapy with carboplatin/paclitaxel, week #5 on 02/15/18. -Continue liquid morphine for pain control -Return to clinic in 1 week: Labs, possible next dose of systemic chemotherapy.

## 2018-03-04 NOTE — Progress Notes (Addendum)
E link called last night by nurse for transient hypoxia Today he has been weaned down to his baseline O2 nasal cannula with no respiratory distress Discussed with Dr. Wendee Beavers.  Will hold off on formal consultation PCCM will be available as needed.  Marshell Garfinkel MD [facility] 03/04/2018, 2:30 PM

## 2018-03-05 ENCOUNTER — Ambulatory Visit: Payer: Medicare Other | Attending: Radiation Oncology

## 2018-03-05 ENCOUNTER — Ambulatory Visit: Payer: No Typology Code available for payment source

## 2018-03-05 ENCOUNTER — Encounter: Payer: Self-pay | Admitting: *Deleted

## 2018-03-05 ENCOUNTER — Other Ambulatory Visit: Payer: Self-pay | Admitting: Radiation Oncology

## 2018-03-05 ENCOUNTER — Ambulatory Visit
Admission: RE | Admit: 2018-03-05 | Discharge: 2018-03-05 | Disposition: A | Discharge status: Home / Self Care | Payer: No Typology Code available for payment source | Source: Ambulatory Visit | Attending: Radiation Oncology | Admitting: Radiation Oncology

## 2018-03-05 DIAGNOSIS — J9601 Acute respiratory failure with hypoxia: Secondary | ICD-10-CM | POA: Diagnosis not present

## 2018-03-05 DIAGNOSIS — C01 Malignant neoplasm of base of tongue: Secondary | ICD-10-CM

## 2018-03-05 LAB — BASIC METABOLIC PANEL
Anion gap: 11 (ref 5–15)
BUN: 24 mg/dL — ABNORMAL HIGH (ref 6–20)
CO2: 28 mmol/L (ref 22–32)
Calcium: 8.7 mg/dL — ABNORMAL LOW (ref 8.9–10.3)
Chloride: 99 mmol/L — ABNORMAL LOW (ref 101–111)
Creatinine, Ser: 0.77 mg/dL (ref 0.61–1.24)
GFR calc Af Amer: >60 mL/min (ref >60)
GFR calc non Af Amer: >60 mL/min (ref >60)
Glucose, Bld: 303 mg/dL — ABNORMAL HIGH (ref 65–99)
Potassium: 4.5 mmol/L (ref 3.5–5.1)
Sodium: 138 mmol/L (ref 135–145)

## 2018-03-05 LAB — CBC
HCT: 24.3 % — ABNORMAL LOW (ref 39.0–52.0)
Hemoglobin: 7.8 g/dL — ABNORMAL LOW (ref 13.0–17.0)
MCH: 28.8 pg (ref 26.0–34.0)
MCHC: 32.1 g/dL (ref 30.0–36.0)
MCV: 89.7 fL (ref 78.0–100.0)
Platelets: 171 10*3/uL (ref 150–400)
RBC: 2.71 MIL/uL — ABNORMAL LOW (ref 4.22–5.81)
RDW: 16.4 % — ABNORMAL HIGH (ref 11.5–15.5)
WBC: 2.5 10*3/uL — ABNORMAL LOW (ref 4.0–10.5)

## 2018-03-05 LAB — GLUCOSE, CAPILLARY
GLUCOSE-CAPILLARY: 260 mg/dL — AB (ref 65–99)
GLUCOSE-CAPILLARY: 298 mg/dL — AB (ref 65–99)
Glucose-Capillary: 331 mg/dL — ABNORMAL HIGH (ref 65–99)
Glucose-Capillary: 263 mg/dL — ABNORMAL HIGH (ref 65–99)
Glucose-Capillary: 290 mg/dL — ABNORMAL HIGH (ref 65–99)

## 2018-03-05 LAB — URINALYSIS, ROUTINE W REFLEX MICROSCOPIC
BACTERIA UA: NONE SEEN
BILIRUBIN URINE: NEGATIVE
Glucose, UA: >=500 mg/dL — AB
Hgb urine dipstick: NEGATIVE
KETONES UR: 5 mg/dL — AB
Leukocytes, UA: NEGATIVE
Nitrite: NEGATIVE
Protein, ur: 30 mg/dL — AB
Specific Gravity, Urine: 1.028 (ref 1.005–1.030)
pH: 5 (ref 5.0–8.0)

## 2018-03-05 MED ORDER — PRO-STAT SUGAR FREE PO LIQD
30.0000 mL | Freq: Every day | ORAL | Status: DC
  Administered 2018-03-05 – 2018-03-08 (×4): 30 mL
  Filled 2018-03-05 (×4): qty 30

## 2018-03-05 MED ORDER — METOPROLOL TARTRATE 25 MG PO TABS: 25.0000 mg | ORAL_TABLET | Freq: Two times a day (BID) | ORAL | Status: DC

## 2018-03-05 MED ORDER — METOPROLOL TARTRATE 25 MG PO TABS
25.0000 mg | ORAL_TABLET | Freq: Two times a day (BID) | ORAL | Status: DC
  Administered 2018-03-05 – 2018-03-08 (×7): 25 mg
  Filled 2018-03-05 (×7): qty 1

## 2018-03-05 MED ORDER — SONAFINE EX EMUL
1.0000 "application " | Freq: Once | CUTANEOUS | Status: AC
  Administered 2018-03-05: 1 via TOPICAL

## 2018-03-05 MED ORDER — JEVITY 1.5 CAL/FIBER PO LIQD
1000.0000 mL | ORAL | Status: DC
  Administered 2018-03-05 – 2018-03-06 (×2): 1000 mL
  Filled 2018-03-05: qty 1000

## 2018-03-05 NOTE — Progress Notes (Signed)
Initial Nutrition Assessment  INTERVENTION:   -Advance Jevity 1.5 to 40 ml/hr via PEG. Advance by 10 ml every 4 hours to goal rate of 65 ml/hr. -30 ml Prostat daily. -This regimen provides 2440 kcal, 114g protein and 1185 ml H2O.  NUTRITION DIAGNOSIS:   Increased nutrient needs related to cancer and cancer related treatments as evidenced by estimated needs.  GOAL:   Patient will meet greater than or equal to 90% of their needs  MONITOR:   Labs, Weight trends, TF tolerance, I & O's, Skin  REASON FOR ASSESSMENT:   Consult Enteral/tube feeding initiation and management  ASSESSMENT:   72 y.o. male with medical history significant of paroxysmal atrial fibrillation, type 2 diabetes, hyperlipidemia, hypertension, recent diagnosis of squamous cell cancer of the throat, PEG tube, mostly nonverbal at baseline who was recently discharged from the hospital after neutropenic fever.   Patient in room with wife and RN at bedside. Pt tolerating Jevity 1.5 @ 20 ml/hr via PEG. Pt's wife was administering bolus feeds of Isosource 1.5 at home, ~480 ml each feed. Pt's wife feels like this volume is too much for patient. Pt had a possible aspiration event PTA. Will continue TF on continuous pump at this time and advance TF.  Per RN, pt may go to [place] today.  Per chart, pt has lost 13 lb since 4/2 (6% wt loss x 1.5 months, significant for time frame).   Medications: Vitamin B-12 tablet BID Labs reviewed: CBGs: 260-331   NUTRITION - FOCUSED PHYSICAL EXAM:    Most Recent Value  Orbital Region  No depletion  Upper Arm Region  Mild depletion  Thoracic and Lumbar Region  Unable to assess  Buccal Region  No depletion  Temple Region  No depletion  Clavicle [place] Region  No depletion  Clavicle and Acromion [place] Region  No depletion  Scapular [place] Region  No depletion  Dorsal Hand  No depletion  Patellar Region  No depletion  Anterior Thigh Region  No depletion  Posterior Calf Region  No depletion   Edema (RD Assessment)  Mild       Diet Order:   Diet Order           Diet NPO time specified  Diet effective now          EDUCATION NEEDS:   Education needs have been addressed  Skin:  Skin Assessment: Skin Integrity Issues: Skin Integrity Issues:: Stage II Stage II: buttocks  Last BM:  5/27  Height:   Ht Readings from Last 1 Encounters:  03/03/18 5\' 9"  (1.753 m)    Weight:   Wt Readings from Last 1 Encounters:  03/05/18 202 lb 9.6 oz (91.9 kg)    Ideal Body Weight:  72.72 kg  BMI:  Body mass index is 29.92 kg/m.  Estimated Nutritional Needs:   Kcal:  6433-2951  Protein:  115-130g  Fluid:  2.4L/day   Clayton Bibles, MS, RD, LDN [place] Dietitian Pager: [phone] After Hours Pager: [phone]

## 2018-03-05 NOTE — Progress Notes (Signed)
[facility] Therapy Treatment Record Phone [phone]   Radiation Therapy was administered to Edgar Herrera on: 03/05/2018  2:30 PM and was treatment #32out of a planned course of 35 treatments.  Radiation Treatment  1). Beam photons with 6-10 energy  2). Brachytherapy None  3). Stereotactic Radiosurgery None  4). Other Radiation None     Jacques Earthly, RT (T)

## 2018-03-05 NOTE — Progress Notes (Signed)
PROGRESS NOTE    Edgar Herrera  ALP:[id number] DOB: [birth year] DOA: 03/03/2018 PCP: [facility]    Brief Narrative:  72 y.o. male with medical history significant of paroxysmal atrial fibrillation, type 2 diabetes, hyperlipidemia, hypertension, recent diagnosis of squamous cell cancer of the throat, PEG tube, mostly nonverbal at baseline who was recently discharged from the hospital after neutropenic fever.  Pt presented with acute hypoxemic respiratory failure and dx with aspiration pna.  Assessment & Plan:   Principal Problem:   Acute hypoxemic respiratory failure ([place]) -  Resolving currently. Supplemental oxygen as needed.  Aspiration pneumonia ([place]) /Sepsis ([place]) - Pt on zosyn and vancomycin will continue for now. Narrow with improvement in condition. - Blood cultures negative to date  Active Problems:   Paroxysmal atrial fibrillation (HCC) - Pt anticoagulated on Eliquis -Placed patient on metoprolol today 5/28    Chronic anticoagulation   Essential hypertension - Will discontinue amlodipine and place on metoprolol given afib with elevated HR's    Squamous cell carcinoma of base of tongue ([place]) - Patient has PEG tube feeds, continue current feeding supplement regimen    HLD (hyperlipidemia) - Stable on statin    T2DM (type 2 diabetes mellitus) ([place]) - Continue sliding scale insulin    Pressure injury of skin - Continue routine wound care per nursing   DVT prophylaxis: Eliquis Code Status: Full Family Communication: Discussed with patient and spouse at bedside Disposition Plan: transfer to telemetry floor   Consultants:   None   Procedures: None   Antimicrobials: Vancomycin and Zosyn   Subjective: No new complaint reported.  Objective: Vitals:   03/05/18 0536 03/05/18 0700 03/05/18 0800 03/05/18 1100  BP:   (!) 134/56 (!) 133/59  Pulse: (!) 145 (!) 121 (!) 112 (!) 101  Resp: (!) 24 11  13   Temp:  97.7 F (36.5 C)    TempSrc:  Oral      SpO2: 96% 97% 96% 97%  Weight:      Height:        Intake/Output Summary (Last 24 hours) at 03/05/2018 1144 Last data filed at 03/05/2018 1055 Gross per 24 hour  Intake 1098 ml  Output 1100 ml  Net -2 ml   Filed Weights   03/03/18 1106 03/04/18 0335 03/05/18 0500  Weight: 94.7 kg (208 lb 12.4 oz) 93.3 kg (205 lb 11 oz) 91.9 kg (202 lb 9.6 oz)    Examination:  General exam: Appears calm and comfortable, in nad. Respiratory system: rhales, no wheezes, equal chest rise. Cardiovascular system: S1 & S2 heard, IRRR. No JVD, murmurs, rubs, gallops or clicks. No pedal edema. Gastrointestinal system: Abdomen is nondistended, soft and nontender. No organomegaly or masses felt. Normal bowel sounds heard. Peg tube in place Central nervous system: Alert and oriented. No focal neurological deficits. Extremities: Symmetric 5 x 5 power. Skin: No rashes, lesions or ulcers, on limited exam. Psychiatry: Mood & affect appropriate.    Data Reviewed: I have personally reviewed following labs and imaging studies  CBC: Recent Labs  Lab 02/27/18 0500 02/28/18 0403 03/01/18 0500 03/03/18 0656 03/04/18 0351 03/05/18 0530  WBC 2.0* 3.9* 6.1 4.3 3.1* 2.5*  NEUTROABS 1.5* 3.3 5.4 4.0  --   --   HGB 8.0* 7.6* 7.7* 8.5* 7.9* 7.8*  HCT 24.2* 23.7* 23.7* 27.0* 24.1* 24.3*  MCV 87.4 88.1 88.8 90.3 89.9 89.7  PLT 120* 121* 137* 170 163 353   Basic Metabolic Panel: Recent Labs  Lab 02/27/18 0500 02/28/18 0403 03/03/18 2992  03/04/18 0351 03/05/18 0530  NA 135 136 136 136 138  K 3.9 3.9 4.4 3.9 4.5  CL 94* 93* 95* 96* 99*  CO2 31 31 31 29 28   GLUCOSE 271* 261* 227* 212* 303*  BUN 22* 23* 24* 19 24*  CREATININE 0.74 0.80 0.86 0.83 0.77  CALCIUM 8.3* 8.3* 8.3* 8.2* 8.7*  MG 1.7  --   --   --   --    GFR: Estimated Creatinine Clearance: 92.1 mL/min (by C-G formula based on SCr of 0.77 mg/dL). Liver Function Tests: Recent Labs  Lab 03/03/18 0656  AST 33  ALT 68*  ALKPHOS 72  BILITOT  0.2*  PROT 6.3*  ALBUMIN 2.7*   No results for input(s): LIPASE, AMYLASE in the last 168 hours. No results for input(s): AMMONIA in the last 168 hours. Coagulation Profile: Recent Labs  Lab 03/03/18 0656  INR 1.22   Cardiac Enzymes: No results for input(s): CKTOTAL, CKMB, CKMBINDEX, TROPONINI in the last 168 hours. BNP (last 3 results) No results for input(s): PROBNP in the last 8760 hours. HbA1C: No results for input(s): HGBA1C in the last 72 hours. CBG: Recent Labs  Lab 03/04/18 1515 03/04/18 1908 03/04/18 2250 03/05/18 0315 03/05/18 0735  GLUCAP 238* 234* 218* 260* 331*   Lipid Profile: No results for input(s): CHOL, HDL, LDLCALC, TRIG, CHOLHDL, LDLDIRECT in the last 72 hours. Thyroid Function Tests: No results for input(s): TSH, T4TOTAL, FREET4, T3FREE, THYROIDAB in the last 72 hours. Anemia Panel: No results for input(s): VITAMINB12, FOLATE, FERRITIN, TIBC, IRON, RETICCTPCT in the last 72 hours. Sepsis Labs: Recent Labs  Lab 03/03/18 0704  LATICACIDVEN 1.04    Recent Results (from the past 240 hour(s))  Culture, blood (x 2)     Status: None   Collection Time: 02/24/18 10:05 AM  Result Value Ref Range Status   Specimen Description   Final    BLOOD RIGHT ANTECUBITAL Performed at [facility] [address] [place], [place] [address]    Special Requests   Final    BOTTLES DRAWN AEROBIC AND ANAEROBIC Blood Culture adequate volume Performed at [facility] [address], [place], [place] [address]    Culture   Final    NO GROWTH 5 DAYS Performed at [facility], [place] [address] [place], [place] [address]    Report Status 03/01/2018 FINAL  Final  Culture, blood (x 2)     Status: None   Collection Time: 02/24/18 10:06 AM  Result Value Ref Range Status   Specimen Description   Final    BLOOD LEFT ANTECUBITAL Performed at [facility] [address] [place], [place] [address]    Special  Requests   Final    BOTTLES DRAWN AEROBIC ONLY Blood Culture adequate volume Performed at [facility] [address] [place], [place] [address]    Culture   Final    NO GROWTH 5 DAYS Performed at [facility], [place] [address] [place], [place] [address]    Report Status 03/01/2018 FINAL  Final  Culture, blood (Routine x 2)     Status: None (Preliminary result)   Collection Time: 03/03/18  7:12 AM  Result Value Ref Range Status   Specimen Description   Final    BLOOD PORTA CATH Performed at [facility] [address], [place],  [address]    Special Requests   Final    BAA BCHV Performed at [facility]  [place] [address], [place], [place] [address]    Culture   Final    NO GROWTH 1 DAY Performed at [facility], [place] [address] [place], [place] [address]    Report Status PENDING  Incomplete  Culture, blood (Routine x 2)     Status: None (Preliminary result)   Collection Time: 03/03/18  7:12 AM  Result Value Ref Range Status   Specimen Description   Final    BLOOD RIGHT ARM Performed at [facility] [address] [place], [place] [address]    Special Requests   Final    AEB BCLV Performed at [place] [address], [place], [place] [address]    Culture   Final    NO GROWTH 1 DAY Performed at [facility], [place] [address], [place], [place] [address]    Report Status PENDING  Incomplete  MRSA PCR Screening     Status: None   Collection Time: 03/03/18 11:13 AM  Result Value Ref Range Status   MRSA by PCR NEGATIVE NEGATIVE Final    Comment:        The GeneXpert MRSA Assay (FDA approved for NASAL specimens only), is one component of a comprehensive MRSA colonization surveillance program. It is not intended to diagnose MRSA infection nor to guide or monitor treatment for MRSA infections. Performed at [facility], [place]  [address], [place], [place] [address]          Radiology Studies: Dg Chest Port 1 View  Result Date: 03/04/2018 CLINICAL DATA:  Shortness of breath EXAM: PORTABLE CHEST 1 VIEW COMPARISON:  03/03/2018 FINDINGS: Shallow inspiration. Cardiac enlargement. No vascular congestion, edema, or consolidation. No blunting of costophrenic angles. No pneumothorax. Power port type central venous catheter with tip over the cavoatrial junction region. Tortuous aorta. IMPRESSION: Shallow inspiration. Cardiac enlargement. No active pulmonary disease. Electronically Signed   By: Lucienne Capers M.D.   On: 03/04/2018 03:25        Scheduled Meds: . allopurinol  100 mg Per Tube Daily  . apixaban  5 mg Per Tube BID  . chlorhexidine  15 mL Mouth Rinse BID  . Chlorhexidine Gluconate Cloth  6 each Topical Daily  . feeding supplement (JEVITY 1.5 CAL/FIBER)  1,000 mL Per Tube Q24H  . free water  60 mL Per Tube 5 X Daily  . insulin aspart  0-9 Units Subcutaneous Q4H  . mouth rinse  15 mL Mouth Rinse q12n4p  . methylPREDNISolone (SOLU-MEDROL) injection  40 mg Intravenous BID  . metoprolol tartrate  25 mg Per Tube BID  . polyvinyl alcohol  1 drop Both Eyes Daily  . pravastatin  40 mg Per Tube Daily  . sodium chloride flush  10-40 mL Intracatheter Q12H  . vitamin B-12  500 mcg Per Tube BID   Continuous Infusions: . piperacillin-tazobactam (ZOSYN)  IV 3.375 g (03/05/18 0856)  . vancomycin 1,500 mg (03/05/18 1037)     LOS: 2 days    Time spent: 98 min  Velvet Bathe, MD [facility] Pager [phone]  If 7PM-7AM, please contact night-coverage [email or web address] Password [facility] 03/05/2018, 11:44 AM

## 2018-03-06 ENCOUNTER — Ambulatory Visit
Admission: RE | Admit: 2018-03-06 | Discharge: 2018-03-06 | Disposition: A | Discharge status: Home / Self Care | Payer: No Typology Code available for payment source | Source: Ambulatory Visit | Attending: Radiation Oncology | Admitting: Radiation Oncology

## 2018-03-06 ENCOUNTER — Ambulatory Visit: Payer: No Typology Code available for payment source

## 2018-03-06 ENCOUNTER — Other Ambulatory Visit: Payer: Self-pay

## 2018-03-06 ENCOUNTER — Encounter (HOSPITAL_COMMUNITY): Payer: Self-pay

## 2018-03-06 DIAGNOSIS — J690 Pneumonitis due to inhalation of food and vomit: Secondary | ICD-10-CM

## 2018-03-06 DIAGNOSIS — C01 Malignant neoplasm of base of tongue: Secondary | ICD-10-CM

## 2018-03-06 DIAGNOSIS — E785 Hyperlipidemia, unspecified: Secondary | ICD-10-CM | POA: Diagnosis not present

## 2018-03-06 DIAGNOSIS — J9601 Acute respiratory failure with hypoxia: Secondary | ICD-10-CM

## 2018-03-06 DIAGNOSIS — E118 Type 2 diabetes mellitus with unspecified complications: Secondary | ICD-10-CM | POA: Diagnosis not present

## 2018-03-06 DIAGNOSIS — I10 Essential (primary) hypertension: Secondary | ICD-10-CM | POA: Diagnosis not present

## 2018-03-06 DIAGNOSIS — I480 Paroxysmal atrial fibrillation: Secondary | ICD-10-CM

## 2018-03-06 LAB — GLUCOSE, CAPILLARY
GLUCOSE-CAPILLARY: 351 mg/dL — AB (ref 65–99)
GLUCOSE-CAPILLARY: 349 mg/dL — AB (ref 65–99)
Glucose-Capillary: 289 mg/dL — ABNORMAL HIGH (ref 65–99)
Glucose-Capillary: 290 mg/dL — ABNORMAL HIGH (ref 65–99)
Glucose-Capillary: 297 mg/dL — ABNORMAL HIGH (ref 65–99)
Glucose-Capillary: 347 mg/dL — ABNORMAL HIGH (ref 65–99)
Glucose-Capillary: 398 mg/dL — ABNORMAL HIGH (ref 65–99)

## 2018-03-06 LAB — CBC WITH DIFFERENTIAL/PLATELET
BASOS ABS: 0 10*3/uL (ref 0.0–0.1)
BASOS PCT: 0 %
EOS PCT: 0 %
Eosinophils Absolute: 0 10*3/uL (ref 0.0–0.7)
HCT: 25.7 % — ABNORMAL LOW (ref 39.0–52.0)
Hemoglobin: 8.2 g/dL — ABNORMAL LOW (ref 13.0–17.0)
Lymphocytes Relative: 5 %
Lymphs Abs: 0.2 10*3/uL — ABNORMAL LOW (ref 0.7–4.0)
MCH: 29 pg (ref 26.0–34.0)
MCHC: 31.9 g/dL (ref 30.0–36.0)
MCV: 90.8 fL (ref 78.0–100.0)
MONOS PCT: 6 %
Monocytes Absolute: 0.2 10*3/uL (ref 0.1–1.0)
NEUTROS ABS: 3.1 10*3/uL (ref 1.7–7.7)
Neutrophils Relative %: 89 %
PLATELETS: 202 10*3/uL (ref 150–400)
RBC: 2.83 MIL/uL — ABNORMAL LOW (ref 4.22–5.81)
RDW: 17 % — AB (ref 11.5–15.5)
WBC: 3.5 10*3/uL — ABNORMAL LOW (ref 4.0–10.5)

## 2018-03-06 LAB — BASIC METABOLIC PANEL
ANION GAP: 10 (ref 5–15)
BUN: 36 mg/dL — ABNORMAL HIGH (ref 6–20)
CALCIUM: 8.4 mg/dL — AB (ref 8.9–10.3)
CO2: 30 mmol/L (ref 22–32)
CREATININE: 0.92 mg/dL (ref 0.61–1.24)
Chloride: 99 mmol/L — ABNORMAL LOW (ref 101–111)
GLUCOSE: 386 mg/dL — AB (ref 65–99)
Potassium: 4.3 mmol/L (ref 3.5–5.1)
Sodium: 139 mmol/L (ref 135–145)

## 2018-03-06 LAB — CREATININE, SERUM: CREATININE: 0.85 mg/dL (ref 0.61–1.24)

## 2018-03-06 MED ORDER — JEVITY 1.5 CAL/FIBER PO LIQD
1000.0000 mL | ORAL | Status: DC
  Administered 2018-03-06 – 2018-03-08 (×3): 1000 mL
  Filled 2018-03-06 (×4): qty 1000

## 2018-03-06 MED ORDER — SCOPOLAMINE 1 MG/3DAYS TD PT72
1.0000 | MEDICATED_PATCH | TRANSDERMAL | Status: DC
  Administered 2018-03-06: 1.5 mg via TRANSDERMAL
  Filled 2018-03-06: qty 1

## 2018-03-06 MED ORDER — INSULIN GLARGINE 100 UNIT/ML ~~LOC~~ SOLN
15.0000 [IU] | Freq: Every day | SUBCUTANEOUS | Status: DC
  Administered 2018-03-06: 15 [IU] via SUBCUTANEOUS
  Filled 2018-03-06 (×2): qty 0.15

## 2018-03-06 MED ORDER — INSULIN ASPART 100 UNIT/ML ~~LOC~~ SOLN
4.0000 [IU] | SUBCUTANEOUS | Status: DC
  Administered 2018-03-06: 4 [IU] via SUBCUTANEOUS

## 2018-03-06 MED ORDER — INSULIN ASPART 100 UNIT/ML ~~LOC~~ SOLN
4.0000 [IU] | SUBCUTANEOUS | Status: DC
  Administered 2018-03-06 – 2018-03-07 (×3): 4 [IU] via SUBCUTANEOUS

## 2018-03-06 NOTE — Progress Notes (Signed)
PROGRESS NOTE  Edgar Herrera [id number]:[id number] DOB: August 08, 1946 DOA: 03/03/2018 PCP: [facility]  HPI/Recap of past 26 hours: 72 year old male with medical history significant for paroxysmal A. 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  Patient was recently discharged from hospital after managed for neutropenic fever.  Patient admitted for further management.  Today, patient's reported feeling slightly better, still with significant secretions, cough.  Denies any chest pain, fever/chills, abdominal pain, worsening shortness of breath.  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)   HLD (hyperlipidemia)   T2DM (type 2 diabetes mellitus) (HCC)   Sepsis (HCC)   Aspiration pneumonia (HCC)   Pressure injury of skin  Aspiration pneumonia Afebrile, with mild leukopenia, increased secretions BC x2 NGTD, MRSA PCR negative Chest x-ray showed no consolidation/infiltrate Continue IV Zosyn, stopped vancomycin Start scopolamine for increased secretions  Acute hypoxemic respiratory failure Improved Supplemental oxygen as needed  Diabetes mellitus type 2 Uncontrolled CBGs in 200s-300s Last A1c 7.7 Start Lantus, NovoLog every 4 hours, SSI, adjust as needed Hold home metformin, glipizide  Anemia of chronic disease/malignancy Stable  Paroxysmal A. Fib Currently rate controlled Continue metoprolol, Eliquis  Hypertension Stable Discontinued home amlodipine, started metoprolol given A. fib with mild RVR  Hyperlipidemia Continue pravastatin  Squamous cell carcinoma of base of tongue Status post PEG tube feeds Follows with oncology, currently  on radiation treatments Continue Solu-Medrol   Code Status: Full  Family Communication: Wife at bedside  Disposition Plan: Once stable   Consultants:  None  Procedures:  None  Antimicrobials:  Zosyn  DVT prophylaxis: Eliquis   Objective: Vitals:   03/05/18 1959 03/06/18 0427 03/06/18 0850 03/06/18 1314  BP: 123/63 116/68 127/61 121/74  Pulse: (!) 52 74 (!) 50 76  Resp: 14 10 16 16   Temp:  (!) 97.5 F (36.4 C) 98.5 F (36.9 C) 97.7 F (36.5 C)  TempSrc: Oral Oral Oral Oral  SpO2: 98% 91% 93% 99%  Weight:  91.7 kg (202 lb 2.6 oz)    Height:        Intake/Output Summary (Last 24 hours) at 03/06/2018 1443 Last data filed at 03/06/2018 0600 Gross per 24 hour  Intake 430 ml  Output 350 ml  Net 80 ml   Filed Weights   03/04/18 0335 03/05/18 0500 03/06/18 0427  Weight: 93.3 kg (205 lb 11 oz) 91.9 kg (202 lb 9.6 oz) 91.7 kg (202 lb 2.6 oz)    Exam:   General: NAD  Cardiovascular: S1, S2 present  Respiratory: Bilateral rales noted, no wheezing, diminished breath sounds  Abdomen: Soft, nontender, nondistended, bowel sounds present, PEG tube in place  Musculoskeletal: No pedal edema bilaterally  Skin: Dry/flaky skin  Psychiatry: Normal mood   Data Reviewed: CBC: Recent Labs  Lab 02/28/18 0403 03/01/18 0500 03/03/18 0656 03/04/18 0351 03/05/18 0530 03/06/18 0839  WBC 3.9* 6.1 4.3 3.1* 2.5* 3.5*  NEUTROABS 3.3 5.4 4.0  --   --  3.1  HGB 7.6* 7.7* 8.5* 7.9* 7.8* 8.2*  HCT 23.7* 23.7* 27.0* 24.1* 24.3* 25.7*  MCV 88.1 88.8 90.3 89.9 89.7 90.8  PLT 121* 137* 170 163 171 448   Basic Metabolic Panel: Recent Labs  Lab 02/28/18 0403 03/03/18 0656 03/04/18 0351 03/05/18  0530 03/06/18 0332 03/06/18 0839  NA 136 136 136 138  --  139  K 3.9 4.4 3.9 4.5  --  4.3  CL 93* 95* 96* 99*  --  99*  CO2 31 31 29 28   --  30  GLUCOSE 261* 227* 212* 303*  --  386*  BUN 23* 24* 19 24*  --  36*  CREATININE 0.80 0.86 0.83 0.77 0.85 0.92  CALCIUM 8.3*  8.3* 8.2* 8.7*  --  8.4*   GFR: Estimated Creatinine Clearance: 80 mL/min (by C-G formula based on SCr of 0.92 mg/dL). Liver Function Tests: Recent Labs  Lab 03/03/18 0656  AST 33  ALT 68*  ALKPHOS 72  BILITOT 0.2*  PROT 6.3*  ALBUMIN 2.7*   No results for input(s): LIPASE, AMYLASE in the last 168 hours. No results for input(s): AMMONIA in the last 168 hours. Coagulation Profile: Recent Labs  Lab 03/03/18 0656  INR 1.22   Cardiac Enzymes: No results for input(s): CKTOTAL, CKMB, CKMBINDEX, TROPONINI in the last 168 hours. BNP (last 3 results) No results for input(s): PROBNP in the last 8760 hours. HbA1C: No results for input(s): HGBA1C in the last 72 hours. CBG: Recent Labs  Lab 03/05/18 1956 03/06/18 0001 03/06/18 0423 03/06/18 0736 03/06/18 1140  GLUCAP 290* 289* 351* 398* 349*   Lipid Profile: No results for input(s): CHOL, HDL, LDLCALC, TRIG, CHOLHDL, LDLDIRECT in the last 72 hours. Thyroid Function Tests: No results for input(s): TSH, T4TOTAL, FREET4, T3FREE, THYROIDAB in the last 72 hours. Anemia Panel: No results for input(s): VITAMINB12, FOLATE, FERRITIN, TIBC, IRON, RETICCTPCT in the last 72 hours. Urine analysis:    Component Value Date/Time   COLORURINE YELLOW 03/05/2018 2044   APPEARANCEUR CLEAR 03/05/2018 2044   LABSPEC 1.028 03/05/2018 2044   PHURINE 5.0 03/05/2018 2044   GLUCOSEU >=500 (A) 03/05/2018 2044   HGBUR NEGATIVE 03/05/2018 2044   BILIRUBINUR NEGATIVE 03/05/2018 2044   KETONESUR 5 (A) 03/05/2018 2044   PROTEINUR 30 (A) 03/05/2018 2044   NITRITE NEGATIVE 03/05/2018 2044   LEUKOCYTESUR NEGATIVE 03/05/2018 2044   Sepsis Labs: @LABRCNTIP (procalcitonin:4,lacticidven:4)  ) Recent Results (from the past 240 hour(s))  Culture, blood (Routine x 2)     Status: None (Preliminary result)   Collection Time: 03/03/18  7:12 AM  Result Value Ref Range Status   Specimen Description   Final    BLOOD PORTA CATH Performed at [facility], [place] [address] [place], [place] [address]    Special Requests   Final    BAA BCHV Performed at [facility], [place] [address] [place], [place] [address]    Culture   Final    NO GROWTH 3 DAYS Performed at [facility], [place] [address], [place], [place] [address]    Report Status PENDING  Incomplete  Culture, blood (Routine x 2)     Status: None (Preliminary result)   Collection Time: 03/03/18  7:12 AM  Result Value Ref Range Status   Specimen Description   Final    BLOOD RIGHT ARM Performed at [facility] [address] [place], [place] [address]    Special Requests   Final    AEB BCLV Performed at [place] [address] [place], [place] [address]    Culture   Final    NO GROWTH 3 DAYS Performed at [facility], [place] [address], [place], [place] [address]    Report Status PENDING  Incomplete  MRSA PCR Screening     Status: None   Collection Time: 03/03/18 11:13 AM  Result Value Ref Range Status   MRSA by PCR NEGATIVE NEGATIVE Final    Comment:        The GeneXpert MRSA Assay (FDA approved for NASAL specimens only), is one component of a comprehensive MRSA colonization surveillance program. It is not intended to diagnose MRSA infection nor to guide or monitor treatment for MRSA infections. Performed at [facility], [place] [address], [place], [place] [address]       Studies: No results found.  Scheduled Meds: . allopurinol  100 mg Per Tube Daily  . apixaban  5 mg Per Tube BID  . chlorhexidine  15 mL Mouth Rinse BID  . Chlorhexidine Gluconate Cloth  6 each Topical Daily  . feeding supplement (PRO-STAT SUGAR FREE 64)  30 mL Per Tube Daily  . free water  60 mL Per Tube 5 X Daily  . insulin aspart  0-9 Units Subcutaneous Q4H  . mouth rinse  15 mL Mouth Rinse q12n4p  . methylPREDNISolone (SOLU-MEDROL) injection  40 mg Intravenous BID  . metoprolol  tartrate  25 mg Per Tube BID  . polyvinyl alcohol  1 drop Both Eyes Daily  . pravastatin  40 mg Per Tube Daily  . scopolamine  1 patch Transdermal Q72H  . sodium chloride flush  10-40 mL Intracatheter Q12H  . vitamin B-12  500 mcg Per Tube BID    Continuous Infusions: . feeding supplement (JEVITY 1.5 CAL/FIBER) 1,000 mL (03/06/18 1227)  . piperacillin-tazobactam (ZOSYN)  IV Stopped (03/06/18 1235)     LOS: 3 days     Alma Friendly, MD [facility]   If 7PM-7AM, please contact night-coverage [email or web address] Password [facility] 03/06/2018, 2:43 PM

## 2018-03-06 NOTE — Care Management Important Message (Signed)
Important Message  Patient Details  Name: Edgar Herrera MRN: [id number] Date of Birth: 1946-08-12   Medicare Important Message Given:  Yes    Kerin Salen 03/06/2018, 12:28 [place] Message  Patient Details  Name: Edgar Herrera MRN: [id number] Date of Birth: 08/29/1946   Medicare Important Message Given:  Yes    Kerin Salen 03/06/2018, 12:28 PM

## 2018-03-06 NOTE — Progress Notes (Signed)
Inpatient Diabetes Program Recommendations  AACE/ADA: New Consensus Statement on Inpatient Glycemic Control (2015)  Target Ranges:  Prepandial:   less than 140 mg/dL      Peak postprandial:   less than 180 mg/dL (1-2 hours)      Critically ill patients:  140 - 180 mg/dL   Lab Results  Component Value Date   GLUCAP 349 (H) 03/06/2018   HGBA1C 7.7 (H) 02/24/2018    Review of Glycemic Control  Diabetes history: DM2 Outpatient Diabetes medications: glipizide 5 mg in am 2.5 mg ac dinner Current orders for Inpatient glycemic control: Novolog 0-9 units Q4H  HgbA1C - 7.7% - may not be accurate d/t low H/H. Blood sugars in 300s. Would benefit from addition of basal insulin.  Inpatient Diabetes Program Recommendations:     Add Lantus 15 units Q24H Add Novolog 4 units Q4H  Continue to follow.  Thank you. Lorenda Peck, RD, LDN, CDE Inpatient Diabetes Coordinator [phone]

## 2018-03-06 NOTE — Progress Notes (Signed)
PT Cancellation Note  Patient Details Name: Edgar Herrera MRN: [id number] DOB: Jan 30, 1946   Cancelled Treatment:    Reason Eval/Treat Not Completed: Patient at procedure or test/unavailable   Weston Anna, MPT Pager: [phone]

## 2018-03-06 NOTE — Evaluation (Signed)
SLP Cancellation Note  Patient Details Name: Edgar Herrera MRN: [id number] DOB: Apr 01, 1946   Cancelled treatment:       Reason Eval/Treat Not Completed: Other (comment);Patient at procedure or test/unavailable(Pt at radiation, earlier SLP spoke to pt and he reported not consuming po at home - and questionable aspiration of secretions - will follow up )   Macario Golds 03/06/2018, 3:18 PM   Luanna Salk, [place] [name] SLP [phone]

## 2018-03-06 NOTE — Progress Notes (Signed)
Pharmacy Antibiotic Note  Edgar Herrera is a 72 y.o. male admitted on 03/03/2018 with aspiration pneumonia.  Pharmacy has been consulted for Vancomycin and Zosyn dosing.  Today, 03/06/2018: WBC 2.5 (solumedrol) SCr 0.85 Afebrile, hypothermic 97.5 Room air  Plan:  D/c vancomycin today per MD  Continue Zosyn 3.375g IV Q8H infused over 4hrs.  MD to consider narrowing Zosyn tomorrow.  Pharmacy to sign off note writing.  Dosage remains stable and need for further dosage adjustment appears unlikely at present.    Please reconsult if a change in clinical status warrants re-evaluation of dosage.   Height: 5\' 9"  (175.3 cm) Weight: 202 lb 2.6 oz (91.7 kg) IBW/kg (Calculated) : 70.7  Temp (24hrs), Avg:97.9 F (36.6 C), Min:97.5 F (36.4 C), Max:98.2 F (36.8 C)  Recent Labs  Lab 02/28/18 0403 03/01/18 0500 03/03/18 0656 03/03/18 0704 03/04/18 0351 03/05/18 0530 03/06/18 0332  WBC 3.9* 6.1 4.3  --  3.1* 2.5*  --   CREATININE 0.80  --  0.86  --  0.83 0.77 0.85  LATICACIDVEN  --   --   --  1.04  --   --   --     Estimated Creatinine Clearance: 86.6 mL/min (by C-G formula based on SCr of 0.85 mg/dL).    No Known Allergies  Antimicrobials this admission: 5/26 Vancomycin >> 5/29 5/26 Zosyn >>   Dose adjustments this admission:   Microbiology results: 5/26 BCx [facility]): ngtd 5/26 Bcx (right arm): ngtd 5/26 MRSA PCR: neg  Thank you for allowing pharmacy to be a part of this patient's care.  Gretta Arab PharmD, BCPS Pager [phone] 03/06/2018 7:51 AM

## 2018-03-07 ENCOUNTER — Ambulatory Visit: Payer: No Typology Code available for payment source

## 2018-03-07 ENCOUNTER — Inpatient Hospital Stay: Payer: No Typology Code available for payment source

## 2018-03-07 ENCOUNTER — Telehealth: Payer: Self-pay | Admitting: *Deleted

## 2018-03-07 ENCOUNTER — Inpatient Hospital Stay: Payer: No Typology Code available for payment source | Admitting: Hematology and Oncology

## 2018-03-07 ENCOUNTER — Ambulatory Visit
Admission: RE | Admit: 2018-03-07 | Discharge: 2018-03-07 | Disposition: A | Discharge status: Home / Self Care | Payer: No Typology Code available for payment source | Source: Ambulatory Visit | Attending: Radiation Oncology | Admitting: Radiation Oncology

## 2018-03-07 DIAGNOSIS — I10 Essential (primary) hypertension: Secondary | ICD-10-CM | POA: Diagnosis not present

## 2018-03-07 DIAGNOSIS — J690 Pneumonitis due to inhalation of food and vomit: Secondary | ICD-10-CM | POA: Diagnosis not present

## 2018-03-07 DIAGNOSIS — E785 Hyperlipidemia, unspecified: Secondary | ICD-10-CM | POA: Diagnosis not present

## 2018-03-07 DIAGNOSIS — J9601 Acute respiratory failure with hypoxia: Secondary | ICD-10-CM | POA: Diagnosis not present

## 2018-03-07 LAB — GLUCOSE, CAPILLARY
GLUCOSE-CAPILLARY: 352 mg/dL — AB (ref 65–99)
Glucose-Capillary: 293 mg/dL — ABNORMAL HIGH (ref 65–99)
Glucose-Capillary: 249 mg/dL — ABNORMAL HIGH (ref 65–99)
Glucose-Capillary: 164 mg/dL — ABNORMAL HIGH (ref 65–99)

## 2018-03-07 LAB — CBC WITH DIFFERENTIAL/PLATELET
Basophils Absolute: 0 10*3/uL (ref 0.0–0.1)
Basophils Relative: 0 %
Eosinophils Absolute: 0 10*3/uL (ref 0.0–0.7)
Eosinophils Relative: 0 %
HCT: 24.8 % — ABNORMAL LOW (ref 39.0–52.0)
Hemoglobin: 7.7 g/dL — ABNORMAL LOW (ref 13.0–17.0)
LYMPHS ABS: 0.2 10*3/uL — AB (ref 0.7–4.0)
LYMPHS PCT: 4 %
MCH: 28.6 pg (ref 26.0–34.0)
MCHC: 31 g/dL (ref 30.0–36.0)
MCV: 92.2 fL (ref 78.0–100.0)
MONO ABS: 0.3 10*3/uL (ref 0.1–1.0)
MONOS PCT: 7 %
Neutro Abs: 3.7 10*3/uL (ref 1.7–7.7)
Neutrophils Relative %: 89 %
Platelets: 222 10*3/uL (ref 150–400)
RBC: 2.69 MIL/uL — ABNORMAL LOW (ref 4.22–5.81)
RDW: 17.2 % — AB (ref 11.5–15.5)
WBC: 4.2 10*3/uL (ref 4.0–10.5)

## 2018-03-07 LAB — BASIC METABOLIC PANEL
Anion gap: 9 (ref 5–15)
BUN: 38 mg/dL — ABNORMAL HIGH (ref 6–20)
CALCIUM: 8.3 mg/dL — AB (ref 8.9–10.3)
CO2: 31 mmol/L (ref 22–32)
CREATININE: 0.85 mg/dL (ref 0.61–1.24)
Chloride: 101 mmol/L (ref 101–111)
GFR calc Af Amer: >60 mL/min (ref >60)
GFR calc non Af Amer: >60 mL/min (ref >60)
GLUCOSE: 312 mg/dL — AB (ref 65–99)
Potassium: 4.1 mmol/L (ref 3.5–5.1)
Sodium: 141 mmol/L (ref 135–145)

## 2018-03-07 MED ORDER — AMOXICILLIN-POT CLAVULANATE 400-57 MG/5ML PO SUSR
880.0000 mg | Freq: Two times a day (BID) | ORAL | Status: DC
  Administered 2018-03-07 – 2018-03-08 (×3): 880 mg via ORAL
  Filled 2018-03-07 (×3): qty 11

## 2018-03-07 MED ORDER — INSULIN ASPART 100 UNIT/ML ~~LOC~~ SOLN
6.0000 [IU] | SUBCUTANEOUS | Status: DC
  Administered 2018-03-07 – 2018-03-08 (×7): 6 [IU] via SUBCUTANEOUS

## 2018-03-07 MED ORDER — INSULIN GLARGINE 100 UNIT/ML ~~LOC~~ SOLN
15.0000 [IU] | Freq: Two times a day (BID) | SUBCUTANEOUS | Status: DC
  Administered 2018-03-07 – 2018-03-08 (×3): 15 [IU] via SUBCUTANEOUS
  Filled 2018-03-07 (×4): qty 0.15

## 2018-03-07 NOTE — Progress Notes (Signed)
[facility] Therapy Treatment Record Phone [phone]   Radiation Therapy was administered to [name] on: 03/07/2018  2:46 PM and was treatment # 34 out of a planned course of 35 treatments.  Radiation Treatment  1). Beam photons with 6-10 energy and Photons 6-10 MeV  2). Brachytherapy None  3). Stereotactic Radiosurgery None  4). Other Radiation None     Tahir Blank, Laneshia Pina, RT (T)

## 2018-03-07 NOTE — Telephone Encounter (Signed)
Oncology Nurse Navigator Documentation  Rec'd call from pt wife, provided clarification for today's Dr. Lebron Conners and RT appts.   Gayleen Orem, RN, BSN Head & Neck Oncology Nurse [place] [facility] [place] [phone]

## 2018-03-07 NOTE — Evaluation (Signed)
Clinical/Bedside Swallow Evaluation Patient Details  Name: Mattia Liford MRN: [id number] Date of Birth: [birth year]  Today's Date: 03/07/2018 Time: SLP Start Time (ACUTE ONLY): 0900 SLP Stop Time (ACUTE ONLY): 0950 SLP Time Calculation (min) (ACUTE ONLY): 50 min  Past Medical History:  Past Medical History:  Diagnosis Date  . Allergic rhinitis   . Atrial fibrillation ([place])   . Diabetes mellitus without complication ([place])   . HLD (hyperlipidemia) 02/21/2018  . Hyperlipidemia   . Hypertension   . Prostate cancer ([place])   . T2DM (type 2 diabetes mellitus) ([place]) 02/21/2018   Past Surgical History:  Past Surgical History:  Procedure Laterality Date  . KNEE SURGERY Bilateral    2007 and 1998,   . PROSTATECTOMY  12/15/2006  . SKIN GRAFT     as a child left elbow  . 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  Pt also with PMH + for DM2, HTN, allergic rhinitis.  Pt with right lower lobe atx or pna superior segment.  Swallow eval ordered.  Recent admit for neutropenia with fevers, radiation induced dermatitis/stomatitis.  He was on a clear diet when dc'd hospital 03/01/18.       Assessment / Plan / Recommendation Clinical Impression  Pt presents with adequate oral motor strength and function. Voice quality is low in intensity, but clear. Mild oral secretions noted on the right. Pt/wife report pt has not been taking po due to difficulty managing thick secretions.  He has 2 more radiation treatments to go, for a total of 35. Pt was encouraged to try ice chips one at a time following thorough oral care to minimize disuse atrophy. Ice chip protocol information reviewed with pt/family and posted at [facility].  Pt would benefit from continued ST intervention following acute care DC for education regarding strengthening exercises to extend muscular integrity and re-evaluate swallow function  periodically. ST will follow acutely. RN informed.   SLP Visit Diagnosis: Dysphagia, unspecified (R13.10)    Aspiration Risk  Severe aspiration risk    Diet Recommendation NPO;Ice chips PRN after oral care   Medication Administration: Via alternative means Supervision: Patient able to self feed Compensations: Minimize environmental distractions;Slow rate;Small sips/bites Postural Changes: Remain upright for at least 30 minutes after po intake;Seated upright at 90 degrees    Other  Recommendations Oral Care Recommendations: Oral care prior to ice chip/H20 Other Recommendations: Have oral suction available   Follow up Recommendations (continued ST intervention is recommended at next venue)      Frequency and Duration min 1 x/week  1 week;2 weeks       Prognosis Prognosis for Safe Diet Advancement: Guarded Barriers to Reach Goals: Severity of deficits      Swallow Study   General Date of Onset: 02/27/18 HPI: Pt is a 72 yo male with 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  Pt also with PMH + for DM2, HTN, allergic rhinitis.  Pt with right lower lobe atx or pna superior segment.  Swallow eval ordered.  Recent admit for neutropenia with fevers, radiation induced dermatitis/stomatitis.  He was on a clear diet when dc'd hospital 03/01/18.     Type of Study: Bedside Swallow Evaluation Previous Swallow Assessment: MBS in April, no report available Diet Prior to this Study: PEG tube;NPO Temperature Spikes Noted: No Respiratory Status: Room  air History of Recent Intubation: No Behavior/Cognition: Alert;Cooperative;Pleasant mood Oral Cavity Assessment: Within Functional Limits Oral Care Completed by SLP: Yes Vision: Functional for self-feeding Self-Feeding Abilities: Able to feed self Patient Positioning: Upright in chair Baseline Vocal Quality: Low vocal intensity Volitional Cough: Weak Volitional Swallow: Able to elicit    Oral/Motor/Sensory  Function Overall Oral Motor/Sensory Function: Within functional limits   Ice Chips Ice chips: Within functional limits Presentation: Spoon;Self Fed   Thin Liquid Thin Liquid: Not tested    Nectar Thick Nectar Thick Liquid: Not tested   Honey Thick Honey Thick Liquid: Not tested   Puree Puree: Not tested   Solid   GO   Solid: Not tested       Adeliz Tonkinson B. Quentin Ore, [facility], [place] Speech Language Pathologist [phone]  Shonna Chock 03/07/2018,9:56 AM

## 2018-03-07 NOTE — Progress Notes (Signed)
PROGRESS NOTE  Edgar Herrera QQV:[id number] DOB: 1946-08-23 DOA: 03/03/2018 PCP: [facility]  HPI/Recap of past 54 hours: 72 year old male with medical history significant for paroxysmal A. 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  Patient was recently discharged from hospital after managed for neutropenic fever.  Patient admitted for further management.  Today, patient denies any chest pain, fever/chills, abdominal pain, worsening shortness of breath. Patients wife reported pt being very confused, especially at night time. Currently, pt is AAOX3, although takes a little longer to answer questions. Noted to have worsening lower extremity edema.  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)   HLD (hyperlipidemia)   T2DM (type 2 diabetes mellitus) (HCC)   Sepsis (HCC)   Aspiration pneumonia (HCC)   Pressure injury of skin  Aspiration pneumonia Afebrile, with no leukocytosis BC x2 NGTD, MRSA PCR negative Chest x-ray showed no consolidation/infiltrate Switch IV Zosyn to PO Augmentin for a total of 7 days Scopolamine for increased secretions  Acute hypoxemic respiratory failure Improved Supplemental oxygen as needed  Lower extremity edema Noted to have lower extremity edema BNP, CXR, ECHO pending in am  Diabetes mellitus type 2 Uncontrolled CBGs in 200s-300s Last A1c 7.7 Continue increased dose of Lantus, NovoLog every 4 hours, SSI, adjust as needed Hold home metformin, glipizide  Anemia of chronic disease/malignancy Stable Daily CBC  Paroxysmal A. Fib Currently rate controlled Continue metoprolol,  Eliquis  Hypertension Stable Discontinued home amlodipine, started metoprolol given A. fib with mild RVR  Hyperlipidemia Continue pravastatin  Squamous cell carcinoma of base of tongue Status post PEG tube feeds Follows with oncology, currently on radiation treatments S/p IV Solu-Medrol   Code Status: Full  Family Communication: Wife at bedside  Disposition Plan: Once stable   Consultants:  None  Procedures:  None  Antimicrobials:  Augmentin  DVT prophylaxis: Eliquis   Objective: Vitals:   03/06/18 2021 03/07/18 0417 03/07/18 0417 03/07/18 1056  BP: 111/60 122/74 122/74 120/90  Pulse: 75 73 63 77  Resp: 16 14 14    Temp: 98.6 F (37 C) 98.3 F (36.8 C) 98.3 F (36.8 C)   TempSrc: Oral Oral Oral   SpO2: 92% 92% 92% 94%  Weight:   88.2 kg (194 lb 7.1 oz)   Height:        Intake/Output Summary (Last 24 hours) at 03/07/2018 1904 Last data filed at 03/07/2018 0324 Gross per 24 hour  Intake 220 ml  Output -  Net 220 ml   Filed Weights   03/05/18 0500 03/06/18 0427 03/07/18 0417  Weight: 91.9 kg (202 lb 9.6 oz) 91.7 kg (202 lb 2.6 oz) 88.2 kg (194 lb 7.1 oz)    Exam:   General: NAD  Cardiovascular: S1, S2 present  Respiratory: No wheezing, diminished breath sounds  Abdomen: Soft, nontender, nondistended, bowel sounds present, PEG tube in place  Musculoskeletal: No pedal edema bilaterally  Skin: Dry/flaky skin  Psychiatry: Normal mood   Data Reviewed: CBC: Recent Labs  Lab 03/01/18 0500 03/03/18 0656 03/04/18 0351 03/05/18 0530 03/06/18 0839 03/07/18 0450  WBC 6.1 4.3 3.1* 2.5* 3.5* 4.2  NEUTROABS 5.4 4.0  --   --  3.1 3.7  HGB 7.7* 8.5* 7.9* 7.8* 8.2* 7.7*  HCT 23.7* 27.0* 24.1* 24.3* 25.7* 24.8*  MCV 88.8 90.3 89.9 89.7 90.8 92.2  PLT 137* 170 163 171 202 371   Basic Metabolic Panel: Recent Labs  Lab 03/03/18 0656 03/04/18 0351 03/05/18 0530 03/06/18 0332 03/06/18 0839 03/07/18 0450  NA 136 136 138  --  139 141  K  4.4 3.9 4.5  --  4.3 4.1  CL 95* 96* 99*  --  99* 101  CO2 31 29 28   --  30 31  GLUCOSE 227* 212* 303*  --  386* 312*  BUN 24* 19 24*  --  36* 38*  CREATININE 0.86 0.83 0.77 0.85 0.92 0.85  CALCIUM 8.3* 8.2* 8.7*  --  8.4* 8.3*   GFR: Estimated Creatinine Clearance: 85.1 mL/min (by C-G formula based on SCr of 0.85 mg/dL). Liver Function Tests: Recent Labs  Lab 03/03/18 0656  AST 33  ALT 68*  ALKPHOS 72  BILITOT 0.2*  PROT 6.3*  ALBUMIN 2.7*   No results for input(s): LIPASE, AMYLASE in the last 168 hours. No results for input(s): AMMONIA in the last 168 hours. Coagulation Profile: Recent Labs  Lab 03/03/18 0656  INR 1.22   Cardiac Enzymes: No results for input(s): CKTOTAL, CKMB, CKMBINDEX, TROPONINI in the last 168 hours. BNP (last 3 results) No results for input(s): PROBNP in the last 8760 hours. HbA1C: No results for input(s): HGBA1C in the last 72 hours. CBG: Recent Labs  Lab 03/06/18 2351 03/07/18 0415 03/07/18 0719 03/07/18 1144 03/07/18 1636  GLUCAP 290* 293* 249* 352* 164*   Lipid Profile: No results for input(s): CHOL, HDL, LDLCALC, TRIG, CHOLHDL, LDLDIRECT in the last 72 hours. Thyroid Function Tests: No results for input(s): TSH, T4TOTAL, FREET4, T3FREE, THYROIDAB in the last 72 hours. Anemia Panel: No results for input(s): VITAMINB12, FOLATE, FERRITIN, TIBC, IRON, RETICCTPCT in the last 72 hours. Urine analysis:    Component Value Date/Time   COLORURINE YELLOW 03/05/2018 2044   APPEARANCEUR CLEAR 03/05/2018 2044   LABSPEC 1.028 03/05/2018 2044   PHURINE 5.0 03/05/2018 2044   GLUCOSEU >=500 (A) 03/05/2018 2044   HGBUR NEGATIVE 03/05/2018 2044   BILIRUBINUR NEGATIVE 03/05/2018 2044   KETONESUR 5 (A) 03/05/2018 2044   PROTEINUR 30 (A) 03/05/2018 2044   NITRITE NEGATIVE 03/05/2018 2044   LEUKOCYTESUR NEGATIVE 03/05/2018 2044   Sepsis Labs: @LABRCNTIP (procalcitonin:4,lacticidven:4)  ) Recent Results (from the past 240 hour(s))  Culture,  blood (Routine x 2)     Status: None (Preliminary result)   Collection Time: 03/03/18  7:12 AM  Result Value Ref Range Status   Specimen Description   Final    BLOOD PORTA CATH Performed at [facility], [place] [address] [place], [place] [address]    Special Requests   Final    BAA BCHV Performed at [facility], [place] [address] [place], [place] [address]    Culture   Final    NO GROWTH 4 DAYS Performed at [facility], [place] [address], [place], [place] [address]    Report Status PENDING  Incomplete  Culture, blood (Routine x 2)     Status: None (Preliminary result)   Collection Time: 03/03/18  7:12 AM  Result Value Ref Range Status   Specimen Description   Final    BLOOD RIGHT ARM Performed at [place] [address], [place], [place] [address]    Special Requests   Final    AEB BCLV Performed at [place]  [address] [place], [place] [address]    Culture   Final    NO GROWTH 4 DAYS Performed at [facility], [place] [address] [place], [place] [address]    Report Status PENDING  Incomplete  MRSA PCR Screening     Status: None   Collection Time: 03/03/18 11:13 AM  Result Value Ref Range Status   MRSA by PCR NEGATIVE NEGATIVE Final    Comment:        The GeneXpert MRSA Assay (FDA approved for NASAL specimens only), is one component of a comprehensive MRSA colonization surveillance program. It is not intended to diagnose MRSA infection nor to guide or monitor treatment for MRSA infections. Performed at [facility], [place] [address] [place], [place] [address]       Studies: No results found.  Scheduled Meds: . allopurinol  100 mg Per Tube Daily  . amoxicillin-clavulanate  880 mg of amoxicillin Oral Q12H  . apixaban  5 mg Per Tube BID  . chlorhexidine  15 mL Mouth Rinse BID  . Chlorhexidine Gluconate Cloth  6 each Topical Daily  . feeding  supplement (PRO-STAT SUGAR FREE 64)  30 mL Per Tube Daily  . free water  60 mL Per Tube 5 X Daily  . insulin aspart  0-9 Units Subcutaneous Q4H  . insulin aspart  6 Units Subcutaneous Q4H  . insulin glargine  15 Units Subcutaneous BID  . mouth rinse  15 mL Mouth Rinse q12n4p  . metoprolol tartrate  25 mg Per Tube BID  . polyvinyl alcohol  1 drop Both Eyes Daily  . pravastatin  40 mg Per Tube Daily  . scopolamine  1 patch Transdermal Q72H  . sodium chloride flush  10-40 mL Intracatheter Q12H  . vitamin B-12  500 mcg Per Tube BID    Continuous Infusions: . feeding supplement (JEVITY 1.5 CAL/FIBER) 1,000 mL (03/07/18 0427)     LOS: 4 days     Alma Friendly, MD [facility]   If 7PM-7AM, please contact night-coverage [email or web address] Password [email or web address] 03/07/2018, 7:04 PM

## 2018-03-07 NOTE — Evaluation (Signed)
Physical Therapy Evaluation Patient Details Name: Edgar Herrera MRN: [id number] DOB: 1946-01-15 Today's Date: 03/07/2018   History of Present Illness  72 yo male admitted with sepsis, acute respiratory failure. He is currently undergoing radiation and chemo. Hx of head/neck cancer, lymphedema, vertigo, gout, Afib, pressure injury, prostate cancer.     Clinical Impression  On eval, pt was Min guard assist for mobility. He walked ~150 feet with a RW. Pt did well with activity. Wife stated she will plan to walk with pt a little more. Will continue to follow during hospital stay. Pt and wife are undecided about f/u PT.     Follow Up Recommendations Home health PT(wife/pt will decide close to d/c)    Equipment Recommendations  None recommended by PT    Recommendations for Other Services       Precautions / Restrictions Precautions Precautions: Fall Precaution Comments: PEG Restrictions Weight Bearing Restrictions: No      Mobility  Bed Mobility               General bed mobility comments: oob in recliner  Transfers Overall transfer level: Needs assistance Equipment used: Rolling walker (2 wheeled) Transfers: Sit to/from Stand Sit to Stand: Min guard         General transfer comment: close guard for safety. VCs hand placement  Ambulation/Gait Ambulation/Gait assistance: Min guard Ambulation Distance (Feet): 150 Feet Assistive device: Rolling walker (2 wheeled) Gait Pattern/deviations: Step-through pattern     General Gait Details: close guard for safety. No LOB with RW use.   Stairs            Wheelchair Mobility    Modified Rankin (Stroke Patients Only)       Balance                                             Pertinent Vitals/Pain Pain Assessment: No/denies pain    Home Living Family/patient expects to be discharged to:: Private residence Living Arrangements: Spouse/significant other Available Help at Discharge:  Family Type of Home: (condo) Home Access: Stairs to enter Entrance Stairs-Rails: None Entrance Stairs-Number of Steps: 2 Home Layout: Able to live on main level with bedroom/bathroom Home Equipment: None      Prior Function Level of Independence: Independent               Hand Dominance        Extremity/Trunk Assessment   Upper Extremity Assessment Upper Extremity Assessment: Overall WFL for tasks assessed    Lower Extremity Assessment Lower Extremity Assessment: Generalized weakness    Cervical / Trunk Assessment Cervical / Trunk Assessment: Normal  Communication   Communication: Expressive difficulties  Cognition Arousal/Alertness: Awake/alert Behavior During Therapy: WFL for tasks assessed/performed Overall Cognitive Status: Within Functional Limits for tasks assessed                                        General Comments      Exercises     Assessment/Plan    PT Assessment Patient needs continued PT services  PT Problem List Decreased balance;Decreased mobility;Decreased strength       PT Treatment Interventions DME instruction;Gait training;Functional mobility training;Therapeutic activities;Balance training;Patient/family education;Therapeutic exercise    PT Goals (Current goals can be found in the Care Plan section)  Acute Rehab PT Goals Patient Stated Goal: home PT Goal Formulation: With patient/family Time For Goal Achievement: 03/21/18 Potential to Achieve Goals: Good    Frequency Min 3X/week   Barriers to discharge        Co-evaluation               AM-PAC PT "6 Clicks" Daily Activity  Outcome Measure Difficulty turning over in bed (including adjusting bedclothes, sheets and blankets)?: A Lot Difficulty moving from lying on back to sitting on the side of the bed? : A Lot Difficulty sitting down on and standing up from a chair with arms (e.g., wheelchair, bedside commode, etc,.)?: A Little Help needed moving  to and from a bed to chair (including a wheelchair)?: A Little Help needed walking in hospital room?: A Little Help needed climbing 3-5 steps with a railing? : A Little 6 Click Score: 16    End of Session   Activity Tolerance: Patient tolerated treatment well Patient left: in chair;with call bell/phone within reach   PT Visit Diagnosis: Muscle weakness (generalized) (M62.81);Difficulty in walking, not elsewhere classified (R26.2)    Time: 5208-0223 PT Time Calculation (min) (ACUTE ONLY): 13 min   Charges:   PT Evaluation $PT Eval Moderate Complexity: 1 Mod     PT G Codes:          Weston Anna, MPT Pager: [phone]

## 2018-03-07 NOTE — Progress Notes (Signed)
Oncology Nurse Navigator Documentation  To provide support, encouragement and care continuity, met with Edgar Herrera and his wife during weekly PUT with Dr. Isidore Moos. He continues with inpatient status at [facility], expected to complete RT this Friday.  Gayleen Orem, RN, BSN Head & Neck Oncology Nurse [place] at [place] [phone]

## 2018-03-08 ENCOUNTER — Inpatient Hospital Stay (HOSPITAL_COMMUNITY): Payer: Medicare Other

## 2018-03-08 ENCOUNTER — Encounter: Payer: Self-pay | Admitting: *Deleted

## 2018-03-08 ENCOUNTER — Ambulatory Visit: Payer: No Typology Code available for payment source

## 2018-03-08 ENCOUNTER — Ambulatory Visit
Admission: RE | Admit: 2018-03-08 | Discharge: 2018-03-08 | Disposition: A | Discharge status: Home / Self Care | Payer: No Typology Code available for payment source | Source: Ambulatory Visit | Attending: Radiation Oncology | Admitting: Radiation Oncology

## 2018-03-08 DIAGNOSIS — I351 Nonrheumatic aortic (valve) insufficiency: Secondary | ICD-10-CM

## 2018-03-08 DIAGNOSIS — Z7901 Long term (current) use of anticoagulants: Secondary | ICD-10-CM | POA: Diagnosis not present

## 2018-03-08 DIAGNOSIS — J690 Pneumonitis due to inhalation of food and vomit: Secondary | ICD-10-CM | POA: Diagnosis not present

## 2018-03-08 DIAGNOSIS — I361 Nonrheumatic tricuspid (valve) insufficiency: Secondary | ICD-10-CM

## 2018-03-08 DIAGNOSIS — I10 Essential (primary) hypertension: Secondary | ICD-10-CM | POA: Diagnosis not present

## 2018-03-08 DIAGNOSIS — J9601 Acute respiratory failure with hypoxia: Secondary | ICD-10-CM | POA: Diagnosis not present

## 2018-03-08 LAB — CBC WITH DIFFERENTIAL/PLATELET
BASOS ABS: 0 10*3/uL (ref 0.0–0.1)
BASOS PCT: 0 %
Eosinophils Absolute: 0 10*3/uL (ref 0.0–0.7)
Eosinophils Relative: 0 %
HEMATOCRIT: 27.1 % — AB (ref 39.0–52.0)
Hemoglobin: 8.6 g/dL — ABNORMAL LOW (ref 13.0–17.0)
Lymphocytes Relative: 4 %
Lymphs Abs: 0.2 10*3/uL — ABNORMAL LOW (ref 0.7–4.0)
MCH: 29.5 pg (ref 26.0–34.0)
MCHC: 31.7 g/dL (ref 30.0–36.0)
MCV: 92.8 fL (ref 78.0–100.0)
MONO ABS: 0.4 10*3/uL (ref 0.1–1.0)
MONOS PCT: 11 %
NEUTROS ABS: 3.2 10*3/uL (ref 1.7–7.7)
NEUTROS PCT: 85 %
Platelets: 251 10*3/uL (ref 150–400)
RBC: 2.92 MIL/uL — ABNORMAL LOW (ref 4.22–5.81)
RDW: 17.6 % — AB (ref 11.5–15.5)
WBC: 3.8 10*3/uL — ABNORMAL LOW (ref 4.0–10.5)

## 2018-03-08 LAB — CULTURE, BLOOD (ROUTINE X 2)
Culture: NO GROWTH
Culture: NO GROWTH

## 2018-03-08 LAB — BASIC METABOLIC PANEL
ANION GAP: 7 (ref 5–15)
BUN: 31 mg/dL — ABNORMAL HIGH (ref 6–20)
CHLORIDE: 102 mmol/L (ref 101–111)
CO2: 36 mmol/L — AB (ref 22–32)
Calcium: 8.3 mg/dL — ABNORMAL LOW (ref 8.9–10.3)
Creatinine, Ser: 0.74 mg/dL (ref 0.61–1.24)
GFR calc non Af Amer: >60 mL/min (ref >60)
GLUCOSE: 186 mg/dL — AB (ref 65–99)
Potassium: 3.6 mmol/L (ref 3.5–5.1)
Sodium: 145 mmol/L (ref 135–145)

## 2018-03-08 LAB — GLUCOSE, CAPILLARY
GLUCOSE-CAPILLARY: 165 mg/dL — AB (ref 65–99)
GLUCOSE-CAPILLARY: 215 mg/dL — AB (ref 65–99)
Glucose-Capillary: 198 mg/dL — ABNORMAL HIGH (ref 65–99)
Glucose-Capillary: 126 mg/dL — ABNORMAL HIGH (ref 65–99)
Glucose-Capillary: 202 mg/dL — ABNORMAL HIGH (ref 65–99)

## 2018-03-08 LAB — ECHOCARDIOGRAM COMPLETE
HEIGHTINCHES: 69 in
WEIGHTICAEL: 3171.1 [oz_av]

## 2018-03-08 LAB — BRAIN NATRIURETIC PEPTIDE: B Natriuretic Peptide: 129.6 pg/mL — ABNORMAL HIGH (ref 0.0–100.0)

## 2018-03-08 MED ORDER — HEPARIN SOD (PORK) LOCK FLUSH 100 UNIT/ML IV SOLN
500.0000 [IU] | INTRAVENOUS | Status: AC | PRN
  Administered 2018-03-08: 500 [IU]

## 2018-03-08 MED ORDER — METOPROLOL TARTRATE 25 MG PO TABS: 25.0000 mg | ORAL_TABLET | Freq: Two times a day (BID) | ORAL | 0 refills | Status: AC

## 2018-03-08 MED ORDER — SCOPOLAMINE 1 MG/3DAYS TD PT72: 1.0000 | MEDICATED_PATCH | TRANSDERMAL | 0 refills | Status: AC

## 2018-03-08 MED ORDER — AMOXICILLIN-POT CLAVULANATE 875-125 MG PO TABS: 1.0000 | ORAL_TABLET | Freq: Two times a day (BID) | ORAL | 0 refills | Status: AC

## 2018-03-08 NOTE — Progress Notes (Signed)
Spouse independent w/ formula feedings.

## 2018-03-08 NOTE — Care Management Note (Signed)
Case Management Note  Patient Details  Name: Edgar Herrera MRN: [id number] Date of Birth: Aug 10, 1946  Subjective/Objective: d/c home w/HHC-[facility] chosen for HHPT-rep Santiago Glad aware. Already set up with [facility] for home 02, & Jev[facility]. [facility] has already delivered home suction machine to patient. No further CM needs.                   Action/Plan:d/c home w/HHC.   Expected Discharge Date:  03/08/18               Expected Discharge Plan:  [place]  In-House Referral:     Discharge planning Services  CM Consult  Post Acute Care Choice:  Durable Medical Equipment(home 02, [facility]ev[facility] formula-[place]) Choice offered to:  Patient, Spouse  DME Arranged:    DME Agency:     HH Arranged:  PT [place]:  [facility]  Status of Service:  Completed, signed off  If discussed at [place] of Stay Meetings, dates discussed:    Additional Comments:  Dessa Phi, RN 03/08/2018, 1:51 PM

## 2018-03-08 NOTE — Discharge Summary (Signed)
Discharge Summary  Edgar Herrera QZR:[id number] DOB: 24-Nov-1945  PCP: [facility]  Admit date: 03/03/2018 Discharge date: 03/08/2018  Time spent: 40 mins  Recommendations for Outpatient Follow-up:  1. PCP 2. Oncology  Discharge Diagnoses:  Active Hospital Problems   Diagnosis Date Noted  . Acute hypoxemic respiratory failure ([place]) 03/03/2018  . Aspiration pneumonia ([place]) 03/03/2018  . Pressure injury of skin 03/03/2018  . Sepsis ([place])   . HLD (hyperlipidemia) 02/21/2018  . T2DM (type 2 diabetes mellitus) ([place]) 02/21/2018  . Squamous cell carcinoma of base of tongue ([place]) 01/01/2018  . Chronic anticoagulation 05/14/2017  . Essential hypertension 05/14/2017  . Paroxysmal atrial fibrillation ([place]) 05/13/2017    Resolved Hospital Problems  No resolved problems to display.    Discharge Condition: Stable  Diet recommendation: Tube feeds  Vitals:   03/08/18 0419 03/08/18 1245  BP: 115/76 129/75  Pulse: 78 78  Resp: 12 17  Temp: 98.1 F (36.7 C) 98.2 F (36.8 C)  SpO2: 93% 96%    History of present illness:  72 year old male with medical history significant for paroxysmal A. 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  Patient was recently discharged from hospital after managed for neutropenic fever.  Patient admitted for further management.  Today, patient denies any new complaints, denies chest pain, fever/chills, abdominal pain, worsening shortness of breath. Last day of radiation, stable for discharge   Hospital Course:  Principal Problem:   Acute hypoxemic respiratory failure ([place]) Active Problems:   Paroxysmal atrial fibrillation (H[facility])   Chronic anticoagulation   Essential hypertension   Squamous cell carcinoma of base of tongue (HCC)   HLD (hyperlipidemia)   T2DM (type 2 diabetes mellitus) (HCC)  Sepsis (HCC)   Aspiration pneumonia (HCC)   Pressure injury of skin  Aspiration pneumonia Afebrile, with no leukocytosis BC x2 NGTD, MRSA PCR negative Chest x-ray showed no consolidation/infiltrate, repeat with some questionable infiltrate Switch IV Zosyn to PO Augmentin for a total of 7 days Scopolamine for increased secretions PCP to follow  Acute hypoxemic respiratory failure Improved, saturating well on RA  Chronic lower extremity edema No hx of CHF, BNP 129 ECHO pending, may be done as outpt PCP to follow, compression stockings  Diabetes mellitus type 2 Better control, Last A1c 7.7 Continue home metformin, glipizide PCP to follow  Anemia of chronic disease/malignancy Stable Daily CBC  Paroxysmal A. Fib Currently rate controlled Continue metoprolol, Eliquis  Hypertension Stable Discontinued home amlodipine, started metoprolol given A. fib with mild RVR  Hyperlipidemia Continue pravastatin  Squamous cell carcinoma of base of tongue Status post PEG tube feeds Follows with oncology, currently on radiation treatments S/p IV Solu-Medrol      Procedures:  None  Consultations:  None   Discharge Exam: BP 129/75 (BP Location: Right Arm)   Pulse 78   Temp 98.2 F (36.8 C) (Oral)   Resp 17   Ht 5\' 9"  (1.753 m)   Wt 89.9 kg (198 lb 3.1 oz)   SpO2 96%   BMI 29.27 kg/m   General: NAD  Cardiovascular: S1, S2 present Respiratory: Diminished breath sound B/L   Discharge Instructions You were cared for by a hospitalist during your hospital stay. If you have any questions about your discharge medications or the care you received while you were in the hospital after you are discharged, you can call the unit and asked to speak with  the hospitalist on call if the hospitalist that took care of you is not available. Once you are discharged, your primary care physician will handle any further medical issues. Please note that NO REFILLS for any discharge  medications will be authorized once you are discharged, as it is imperative that you return to your primary care physician (or establish a relationship with a primary care physician if you do not have one) for your aftercare needs so that they can reassess your need for medications and monitor your lab values.  Discharge Instructions    Diet - low sodium heart healthy   Complete by:  As directed    Increase activity slowly   Complete by:  As directed      Allergies as of 03/08/2018   No Known Allergies     Medication List    STOP taking these medications   amLODipine 10 MG tablet Commonly known as:  NORVASC   ondansetron 8 MG tablet Commonly known as:  ZOFRAN   prochlorperazine 10 MG tablet Commonly known as:  COMPAZINE     TAKE these medications   allopurinol 100 MG tablet Commonly known as:  ZYLOPRIM Take 100 mg by mouth daily.   amoxicillin-clavulanate 875-125 MG tablet Commonly known as:  AUGMENTIN Take 1 tablet by mouth 2 (two) times daily for 3 doses.   apixaban 5 MG Tabs tablet Commonly known as:  ELIQUIS Take 5 mg by mouth 2 (two) times daily.   dexamethasone 4 MG tablet Commonly known as:  DECADRON Take 2 tablets (8 mg total) by mouth daily. Start the day after chemotherapy for 2 days.   glipiZIDE 5 MG tablet Commonly known as:  GLUCOTROL Take 2.5-5 mg by mouth See admin instructions. Take 1 tablet before breakfast and Take 1/2 tablet before dinner.   HYDROcodone-acetaminophen 7.5-325 mg/15 ml solution Commonly known as:  HYCET Take 10 mLs by mouth 4 (four) times daily as needed for moderate pain.   lidocaine 2 % solution Commonly known as:  XYLOCAINE Patient: Mix 1part 2% viscous lidocaine, 1part H20. Swish & swallow 15mL of diluted mixture, 25min before meals and at bedtime, up to QID   lidocaine-prilocaine cream Commonly known as:  EMLA Apply to affected area once What changed:    how much to take  how to take this  when to take  this  reasons to take this  additional instructions   LORazepam 0.5 MG tablet Commonly known as:  ATIVAN Take 1 tablet (0.5 mg total) by mouth every 6 (six) hours as needed (Nausea or vomiting). What changed:  reasons to take this   metFORMIN 1000 MG tablet Commonly known as:  GLUCOPHAGE Take 1,000 mg by mouth 2 (two) times daily with a meal.   metoprolol tartrate 25 MG tablet Commonly known as:  LOPRESSOR Place 1 tablet (25 mg total) into feeding tube 2 (two) times daily.   pravastatin 40 MG tablet Commonly known as:  PRAVACHOL Take 40 mg by mouth daily.   scopolamine 1 MG/3DAYS Commonly known as:  TRANSDERM-SCOP Place 1 patch (1.5 mg total) onto the skin every 3 (three) days. Start taking on:  03/09/2018   sodium fluoride 1.1 % Gel dental gel Commonly known as:  FLUORISHIELD Instill one drop of gel per tooth space of fluoride tray. Place over teeth for 5 minutes. Remove. Spit out excess. Repeat nightly. What changed:    how much to take  how to take this  when to take this  additional instructions  SYSTANE CONTACTS Soln Place 1 drop into both eyes daily.   vitamin B-12 500 MCG tablet Commonly known as:  CYANOCOBALAMIN Take 500 mcg by mouth 2 (two) times daily.      No Known Allergies Follow-up [facility]. Schedule an appointment as soon as possible for a visit.   Specialty:  General Practice Contact information: [place] [phone]            The results of significant diagnostics from this hospitalization (including imaging, microbiology, ancillary and laboratory) are listed below for reference.    Significant Diagnostic Studies: Ct Soft Tissue Neck W Contrast  Result Date: 02/25/2018 CLINICAL DATA:  Head neck squamous cell carcinoma. Difficulty swallowing. EXAM: CT NECK WITH CONTRAST TECHNIQUE: Multidetector CT imaging of the neck was performed using the standard protocol following the  bolus administration of intravenous contrast. CONTRAST:  24mL OMNIPAQUE IOHEXOL 300 MG/ML  SOLN COMPARISON:  CT neck 11/29/2017 FINDINGS: PHARYNX AND LARYNX: --Nasopharynx: Fossae of Rosenmuller are clear. Normal adenoid tonsils for age. --Oral cavity and oropharynx: The palatine and lingual tonsils are normal. The visible oral cavity and floor of mouth are normal. --Hypopharynx: Previously demonstrated mass within the left vallecula and encroaching on the left parapharyngeal space is no longer present. There is no residual mass visualized. --Larynx: Normal epiglottis and pre-epiglottic space. Normal aryepiglottic and vocal folds. --Retropharyngeal space: No abscess, effusion or lymphadenopathy. SALIVARY GLANDS: --Parotid: No mass lesion or inflammation. No sialolithiasis or ductal dilatation. --Submandibular: Symmetric without inflammation. No sialolithiasis or ductal dilatation. --Sublingual: Normal. No ranula or other visible lesion of the base of tongue and floor of mouth. THYROID: Normal. LYMPH NODES: Hypodense left level 2 A node measures 9 mm. A slightly lower left level 2A node measures 10 mm. These nodes have decreased in size. Unchanged appearance of subcentimeter left level 3 nodes. No right-sided adenopathy. VASCULAR: Calcific atherosclerosis of both carotid bifurcations without high-grade stenosis. LIMITED INTRACRANIAL: Normal. VISUALIZED ORBITS: Normal. MASTOIDS AND VISUALIZED PARANASAL SINUSES: No fluid levels or advanced mucosal thickening. No mastoid effusion. SKELETON: No bony spinal canal stenosis. No lytic or blastic lesions. UPPER CHEST: Clear. OTHER: None. IMPRESSION: 1. No residual pharyngeal mass is visible. No specific finding to account for the reported dysphagia. 2. Decreased size of left level 2 A cervical lymph nodes, likely indicating response to therapy. Electronically Signed   By: Ulyses Jarred M.D.   On: 02/25/2018 17:35   Ct Angio Chest Pe W And/or Wo Contrast  Result Date:  03/03/2018 CLINICAL DATA:  Hypoxia. EXAM: CT ANGIOGRAPHY CHEST WITH CONTRAST TECHNIQUE: Multidetector CT imaging of the chest was performed using the standard protocol during bolus administration of intravenous contrast. Multiplanar CT image reconstructions and MIPs were obtained to evaluate the vascular anatomy. CONTRAST:  69mL ISOVUE-370 IOPAMIDOL (ISOVUE-370) INJECTION 76% COMPARISON:  Radiograph of same day. FINDINGS: Cardiovascular: Satisfactory opacification of the pulmonary arteries to the segmental level. No evidence of pulmonary embolism. Normal heart size. No pericardial effusion. Mediastinum/Nodes: No enlarged mediastinal, hilar, or axillary lymph nodes. Thyroid gland, trachea, and esophagus demonstrate no significant findings. Lungs/Pleura: No pneumothorax or pleural effusion is noted. Mild left basilar atelectasis is noted. Mild atelectasis or possibly pneumonia is noted in superior segment of right lower lobe. Mild right upper lobe subsegmental atelectasis or inflammation is noted. Upper Abdomen: Large solitary gallstone is noted without inflammation. Gastrostomy tube is in grossly good position. Musculoskeletal: No chest wall abnormality. No acute or significant osseous findings. Review of the  MIP images confirms the above findings. IMPRESSION: No definite evidence of pulmonary embolus. Atelectasis or pneumonia is noted in superior segment of right lower lobe. Mild right upper lobe subsegmental atelectasis or pneumonia is noted. Mild left basilar subsegmental atelectasis is noted. Large solitary gallstone is noted. Gastrostomy tube in grossly good position. Electronically Signed   By: Marijo Conception, M.D.   On: 03/03/2018 09:17   Dg Chest Port 1 View  Result Date: 03/08/2018 CLINICAL DATA:  Dyspnea. EXAM: PORTABLE CHEST 1 VIEW COMPARISON:  One-view chest x-ray 03/04/2018 FINDINGS: The heart size is exaggerated by low lung volumes. A left subclavian Port-A-Cath is in place. Lung volumes are low.  Progressive left lower lobe airspace disease is present. There is no edema or effusion. IMPRESSION: 1. Developing lingular or left lower lobe airspace disease is concerning for infection or aspiration. 2. Low lung volumes. 3. Left subclavian Port-A-Cath. Electronically Signed   By: San Morelle M.D.   On: 03/08/2018 07:27   Dg Chest Port 1 View  Result Date: 03/04/2018 CLINICAL DATA:  Shortness of breath EXAM: PORTABLE CHEST 1 VIEW COMPARISON:  03/03/2018 FINDINGS: Shallow inspiration. Cardiac enlargement. No vascular congestion, edema, or consolidation. No blunting of costophrenic angles. No pneumothorax. Power port type central venous catheter with tip over the cavoatrial junction region. Tortuous aorta. IMPRESSION: Shallow inspiration. Cardiac enlargement. No active pulmonary disease. Electronically Signed   By: Lucienne Capers M.D.   On: 03/04/2018 03:25   Dg Chest Port 1 View  Result Date: 03/03/2018 CLINICAL DATA:  Fevers. EXAM: PORTABLE CHEST 1 VIEW COMPARISON:  02/21/2018 FINDINGS: Left chest wall port a catheter is noted with tip in the right atrium. Unchanged. There is cardiac enlargement. No pleural effusion or edema. No airspace opacities. IMPRESSION: 1. No acute cardiopulmonary abnormalities. Electronically Signed   By: Kerby Moors M.D.   On: 03/03/2018 07:49   Dg Chest Portable 1 View  Result Date: 02/21/2018 CLINICAL DATA:  Throat cancer.  Coughing up red phlegm. EXAM: PORTABLE CHEST 1 VIEW COMPARISON:  Outside PET-CT 12/24/2017. FINDINGS: Left subclavian PowerPort catheter noted with tip in the right atrium. Cardiomegaly with normal pulmonary vascularity. No focal infiltrate. No pleural effusion pneumothorax. Mild elevation left hemidiaphragm. No acute bony abnormality. Mediastinum and hilar structures are normal. IMPRESSION: One PowerPort catheter noted with tip over the right atrium. 2.  Cardiomegaly.  No pulmonary venous congestion. 3.  No acute pulmonary disease.  Electronically Signed   By: Marcello Moores  Register   On: 02/21/2018 07:41    Microbiology: Recent Results (from the past 240 hour(s))  Culture, blood (Routine x 2)     Status: None   Collection Time: 03/03/18  7:12 AM  Result Value Ref Range Status   Specimen Description   Final    BLOOD PORTA CATH Performed at [facility], [place] [address], [place], [place] [address]    Special Requests   Final    BAA BCHV Performed at [facility], [place] [address] [place], [place] [address]    Culture   Final    NO GROWTH 5 DAYS Performed at [facility], [place] [address], [place], [place] [address]    Report Status 03/08/2018 FINAL  Final  Culture, blood (Routine x 2)     Status: None   Collection Time: 03/03/18  7:12 AM  Result Value Ref Range Status   Specimen Description   Final    BLOOD RIGHT ARM Performed at [facility]  [address] [place], [place] [address]    Special Requests   Final    AEB BCLV Performed at [facility] [address] [place], [place] [address]    Culture   Final    NO GROWTH 5 DAYS Performed at [facility], [place] [address], [place], [place] [address]    Report Status 03/08/2018 FINAL  Final  MRSA PCR Screening     Status: None   Collection Time: 03/03/18 11:13 AM  Result Value Ref Range Status   MRSA by PCR NEGATIVE NEGATIVE Final    Comment:        The GeneXpert MRSA Assay (FDA approved for NASAL specimens only), is one component of a comprehensive MRSA colonization surveillance program. It is not intended to diagnose MRSA infection nor to guide or monitor treatment for MRSA infections. Performed at [facility], [place] [address] [place], [place] [address]      Labs: Basic Metabolic Panel: Recent Labs  Lab 03/04/18 0351 03/05/18 0530 03/06/18 0332 03/06/18 0839 03/07/18 0450 03/08/18 0405  NA 136 138  --  139 141 145  K 3.9 4.5  --   4.3 4.1 3.6  CL 96* 99*  --  99* 101 102  CO2 29 28  --  30 31 36*  GLUCOSE 212* 303*  --  386* 312* 186*  BUN 19 24*  --  36* 38* 31*  CREATININE 0.83 0.77 0.85 0.92 0.85 0.74  CALCIUM 8.2* 8.7*  --  8.4* 8.3* 8.3*   Liver Function Tests: Recent Labs  Lab 03/03/18 0656  AST 33  ALT 68*  ALKPHOS 72  BILITOT 0.2*  PROT 6.3*  ALBUMIN 2.7*   No results for input(s): LIPASE, AMYLASE in the last 168 hours. No results for input(s): AMMONIA in the last 168 hours. CBC: Recent Labs  Lab 03/03/18 0656 03/04/18 0351 03/05/18 0530 03/06/18 0839 03/07/18 0450 03/08/18 0405  WBC 4.3 3.1* 2.5* 3.5* 4.2 3.8*  NEUTROABS 4.0  --   --  3.1 3.7 3.2  HGB 8.5* 7.9* 7.8* 8.2* 7.7* 8.6*  HCT 27.0* 24.1* 24.3* 25.7* 24.8* 27.1*  MCV 90.3 89.9 89.7 90.8 92.2 92.8  PLT 170 163 171 202 222 251   Cardiac Enzymes: No results for input(s): CKTOTAL, CKMB, CKMBINDEX, TROPONINI in the last 168 hours. BNP: BNP (last 3 results) Recent Labs    03/08/18 0405  BNP 129.6*    ProBNP (last 3 results) No results for input(s): PROBNP in the last 8760 hours.  CBG: Recent Labs  Lab 03/07/18 1636 03/08/18 0029 03/08/18 0421 03/08/18 0743 03/08/18 1209  GLUCAP 164* 215* 165* 126* 202*       Signed:  Alma Friendly, MD [facility] 03/08/2018, 12:57 PM

## 2018-03-08 NOTE — Progress Notes (Signed)
  Echocardiogram 2D Echocardiogram has been performed.  Edgar Herrera 03/08/2018, 3:17 PM

## 2018-03-08 NOTE — Progress Notes (Signed)
Oncology Nurse Navigator Documentation  Met with Edgar Herrera during final RT to offer support and to celebrate end of radiation treatment.  He was accompanied by his wife. Provided wife with a Certificate of Recognition for her supportive care. Provided verbal/written post-RT guidance:  Importance of keeping all follow-up appts, especially those with Nutrition and SLP.  Importance of protecting treatment area from sun.  Continuation of Sonafine application 2-3 times daily until supply exhausted after which transition to OTC lotion with vitamin E. Explained my role as navigator will continue for several more months and that I will be calling and/or joining him during follow-up visits.   I encouraged him to call me with needs/concerns.   Patient and wife verbalized understanding of information provided.  Gayleen Orem, RN, BSN, [facility] [phone]

## 2018-03-11 ENCOUNTER — Ambulatory Visit: Payer: Medicare Other | Attending: Radiation Oncology

## 2018-03-11 ENCOUNTER — Encounter: Payer: Self-pay | Admitting: Radiation Oncology

## 2018-03-11 DIAGNOSIS — R1312 Dysphagia, oropharyngeal phase: Secondary | ICD-10-CM | POA: Diagnosis present

## 2018-03-11 LAB — GLUCOSE, CAPILLARY: GLUCOSE-CAPILLARY: 192 mg/dL — AB (ref 65–99)

## 2018-03-11 NOTE — Patient Instructions (Addendum)
Signs of Aspiration Pneumonia   . Chest pain/tightness . Fever (can be low grade) . Cough  o With foul-smelling phlegm (sputum) o With sputum containing pus or blood o With greenish sputum . Fatigue  . Shortness of breath  . Wheezing   **IF YOU HAVE THESE SIGNS, CONTACT YOUR DOCTOR OR GO TO THE EMERGENCY DEPARTMENT OR URGENT CARE AS SOON AS POSSIBLE**    ===================================== Do a swallowing exercise, then a non-swallowing exercise (with a *) Cycle through the exercises, then make another cycle through until you can't do any more cycles  ============================================= When you do the ice chips, do what you can, then stop and take a break for a couple hours at least  ========================================== Use your swallowing muscles as much as you can to inhibit muscle atrophy

## 2018-03-11 NOTE — Therapy (Signed)
[facility] [address] [place], [place], [address] Phone: [phone]   Fax:  [phone]  Speech Language Pathology Treatment  Patient Details  Name: Edgar Herrera MRN: [id number] Date of Birth: [birth year] Referring Provider: [name] MD   Encounter Date: 03/11/2018  End of Session - 03/11/18 1545    Visit Number  2    Number of Visits  4    Date for SLP Re-Evaluation  04/15/18    SLP Start Time  1440    SLP Stop Time   4650    SLP Time Calculation (min)  35 min       Past Medical History:  Diagnosis Date  . Allergic rhinitis   . Atrial fibrillation ([place])   . Diabetes mellitus without complication ([place])   . HLD (hyperlipidemia) 02/21/2018  . Hyperlipidemia   . Hypertension   . Prostate cancer ([place])   . T2DM (type 2 diabetes mellitus) ([place]) 02/21/2018    Past Surgical History:  Procedure Laterality Date  . KNEE SURGERY Bilateral    2007 and 1998,   . PROSTATECTOMY  12/15/2006  . SKIN GRAFT     as a child left elbow  . TONSILECTOMY/ADENOIDECTOMY WITH MYRINGOTOMY     as a child    There were no vitals filed for this visit.  Subjective Assessment - 03/11/18 1449    Patient is accompained by:  Family member wife    Currently in Pain?  No/denies            ADULT SLP TREATMENT - 03/11/18 1449      General Information   Behavior/Cognition  Alert;Cooperative;Pleasant mood      Treatment Provided   Treatment provided  Dysphagia      Dysphagia Treatment   Temperature Spikes Noted  Yes But not currently - pt just d/c'd from hospital     Respiratory Status  Room air    Treatment Methods  Skilled observation;Therapeutic exercise;Patient/caregiver education    Patient observed directly with PO's  No had not performed oral care within 30 minutes of ST    Type of cueing  Verbal;Visual    Amount of cueing  Moderate consistent    Other treatment/comments  PT did not perform oral care within 30  imnutes of ST so no POs today. SLP reviewed parameters around the ice chips (within 30 minutes of thorough oral care, swallow hard, no more that what he could tolerate -strength-wise). HEP was completed routinely in first 3 weeks of rad tx, however in the last 3-4 weeks pt was only able to do/only did HEP minimally. SLP required to provide max cues consistently for appropriate completion. SLP educated pt/wife on overt s/s aspiration PNA, and how to stagger the swallowing/non-swallowing HEP exercises and/or to cycle through all exercises with 1-2 reps and then cycle back through.       Assessment / Recommendations / Plan   Plan  Continue with current plan of care      Dysphagia Recommendations   Diet recommendations  -- ice chips via protocol    Liquids provided via  Cup    Medication Administration  Via alternative means      Progression Toward Goals   Progression toward goals  Progressing toward goals       SLP Education - 03/11/18 1544    Education provided  Yes    Education Details  s/s aspiration PNA, how to complete exercises to benefit pt, HEP procedure, muscle atrophy  Person(s) Educated  Patient;Spouse    Methods  Explanation;Demonstration;Verbal cues;Handout    Comprehension  Verbalized understanding;Returned demonstration;Verbal cues required;Need further instruction       SLP Short Term Goals - 03/11/18 1448      SLP SHORT TERM GOAL #1   Title  pt will complete HEP with rare min A     Time  1    Period  -- visits (visit #3)    Status  On-going      SLP SHORT TERM GOAL #2   Title  pt will tell SLP why he is completing HEP     Time  --    Period  --    Status  Achieved      SLP SHORT TERM GOAL #3   Title  pt will demo head turn to lt with solids, if necessary, with rare min A    Time  1    Period  -- visits (visit #2)    Status  Deferred pt is tube dependent currently (03-11-18)      SLP SHORT TERM GOAL #4   Title  pt will tell SLP 3 overt s/s aspiration PNA with  modified independence    Time  --    Period  --    Status  Achieved       SLP Long Term Goals - 03/11/18 1549      SLP LONG TERM GOAL #1   Title  demo HEP with rare min A    Time  2    Period  -- visits (visit #4)    Status  Revised      SLP LONG TERM GOAL #2   Title  pt will tell SLP when HEP frequency can be reduced to x2-3/week    Time  2    Period  -- visits (visit #4)    Status  On-going       Plan - 03/11/18 1545    Clinical Impression Statement  Pt is NPO currently due to needing to obtain nutrition/hydration via non-oral means at this time due to side effects from rad tx. The probability of swallowing difficulty increases dramatically with the completion of chemo and radiation therapy. Pt will need to be followed by SLP for regular assessment of accurate HEP completion as well as for safety with POs both during and following treatment/s.    Speech Therapy Frequency  -- once approx ever 4 weeks    Duration  -- 3 sessions     Treatment/Interventions  Aspiration precaution training;Pharyngeal strengthening exercises;Diet toleration management by SLP;Trials of upgraded texture/liquids;Internal/external aids;Patient/family education;Compensatory strategies;SLP instruction and feedback;Cueing hierarchy;Environmental controls    Potential to Achieve Goals  Good    Consulted and Agree with Plan of Care  Patient       Patient will benefit from skilled therapeutic intervention in order to improve the following deficits and impairments:   Dysphagia, oropharyngeal phase    Problem List Patient Active Problem List   Diagnosis Date Noted  . Aspiration pneumonia ([place]) 03/03/2018  . Acute hypoxemic respiratory failure ([place]) 03/03/2018  . Pressure injury of skin 03/03/2018  . Throat cancer ([place])   . Dermatitis   . Neutropenic fever ([place])   . Sepsis ([place])   . HLD (hyperlipidemia) 02/21/2018  . T2DM (type 2 diabetes mellitus) ([place]) 02/21/2018  . 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 Atrial flutter ([place]) 05/14/2017  . Chronic anticoagulation 05/14/2017  . Essential hypertension 05/14/2017  .  Paroxysmal atrial fibrillation ([place]) 05/13/2017    [facility] ,[place], [place]  03/11/2018, 3:50 PM  [facility] [address] [place], [place], [address] Phone: [phone]   Fax:  [phone]   Name: Wilberth Damon MRN: [id number] Date of Birth: 06/24/1946

## 2018-03-12 ENCOUNTER — Encounter: Payer: Self-pay | Admitting: Radiation Oncology

## 2018-03-12 NOTE — Progress Notes (Signed)
  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   At the start of treatment, the patient had issues with sleep and blood in his sputum. Towards the end of treatment, he was admitted for respiratory failture, aspiration PNA.  He received inpatient IVF and IV antibiotics. He reported thick sputum but little pain in the throat. His neck had scabbed areas.   Plan: The patient has completed radiation treatment. The patient will return to radiation oncology clinic for routine followup in one half month. I advised them to call or return sooner if they have any questions or concerns related to their recovery or treatment.  -----------------------------------  [name], MD

## 2018-03-14 ENCOUNTER — Encounter: Payer: Self-pay | Admitting: Radiation Oncology

## 2018-03-14 NOTE — Progress Notes (Signed)
error 

## 2018-03-16 ENCOUNTER — Emergency Department (HOSPITAL_COMMUNITY): Payer: Medicare Other

## 2018-03-16 ENCOUNTER — Encounter (HOSPITAL_COMMUNITY): Payer: Self-pay | Admitting: Emergency Medicine

## 2018-03-16 ENCOUNTER — Inpatient Hospital Stay (HOSPITAL_COMMUNITY)
Admission: EM | Admit: 2018-03-16 | Discharge: 2018-03-22 | DRG: 947 | Disposition: A | Discharge status: Skilled Nursing Facility | Payer: Medicare Other | Attending: Family Medicine | Admitting: Family Medicine

## 2018-03-16 ENCOUNTER — Inpatient Hospital Stay (HOSPITAL_COMMUNITY): Payer: Medicare Other

## 2018-03-16 DIAGNOSIS — M109 Gout, unspecified: Secondary | ICD-10-CM | POA: Diagnosis present

## 2018-03-16 DIAGNOSIS — R600 Localized edema: Secondary | ICD-10-CM

## 2018-03-16 DIAGNOSIS — J189 Pneumonia, unspecified organism: Secondary | ICD-10-CM | POA: Diagnosis not present

## 2018-03-16 DIAGNOSIS — M25562 Pain in left knee: Secondary | ICD-10-CM | POA: Diagnosis not present

## 2018-03-16 DIAGNOSIS — E872 Acidosis, unspecified: Secondary | ICD-10-CM

## 2018-03-16 DIAGNOSIS — M25461 Effusion, right knee: Secondary | ICD-10-CM | POA: Diagnosis present

## 2018-03-16 DIAGNOSIS — R262 Difficulty in walking, not elsewhere classified: Secondary | ICD-10-CM | POA: Diagnosis present

## 2018-03-16 DIAGNOSIS — R531 Weakness: Secondary | ICD-10-CM | POA: Diagnosis present

## 2018-03-16 DIAGNOSIS — R5381 Other malaise: Secondary | ICD-10-CM | POA: Diagnosis present

## 2018-03-16 DIAGNOSIS — E785 Hyperlipidemia, unspecified: Secondary | ICD-10-CM | POA: Diagnosis present

## 2018-03-16 DIAGNOSIS — E119 Type 2 diabetes mellitus without complications: Secondary | ICD-10-CM | POA: Diagnosis present

## 2018-03-16 DIAGNOSIS — Z7984 Long term (current) use of oral hypoglycemic drugs: Secondary | ICD-10-CM

## 2018-03-16 DIAGNOSIS — Z8546 Personal history of malignant neoplasm of prostate: Secondary | ICD-10-CM

## 2018-03-16 DIAGNOSIS — T451X5A Adverse effect of antineoplastic and immunosuppressive drugs, initial encounter: Secondary | ICD-10-CM

## 2018-03-16 DIAGNOSIS — M79609 Pain in unspecified limb: Secondary | ICD-10-CM | POA: Diagnosis not present

## 2018-03-16 DIAGNOSIS — J309 Allergic rhinitis, unspecified: Secondary | ICD-10-CM | POA: Diagnosis present

## 2018-03-16 DIAGNOSIS — J181 Lobar pneumonia, unspecified organism: Secondary | ICD-10-CM | POA: Diagnosis present

## 2018-03-16 DIAGNOSIS — I5189 Other ill-defined heart diseases: Secondary | ICD-10-CM | POA: Diagnosis present

## 2018-03-16 DIAGNOSIS — R0602 Shortness of breath: Secondary | ICD-10-CM

## 2018-03-16 DIAGNOSIS — Z79899 Other long term (current) drug therapy: Secondary | ICD-10-CM

## 2018-03-16 DIAGNOSIS — E8809 Other disorders of plasma-protein metabolism, not elsewhere classified: Secondary | ICD-10-CM | POA: Diagnosis not present

## 2018-03-16 DIAGNOSIS — E118 Type 2 diabetes mellitus with unspecified complications: Secondary | ICD-10-CM | POA: Diagnosis not present

## 2018-03-16 DIAGNOSIS — I10 Essential (primary) hypertension: Secondary | ICD-10-CM | POA: Diagnosis present

## 2018-03-16 DIAGNOSIS — C01 Malignant neoplasm of base of tongue: Secondary | ICD-10-CM | POA: Diagnosis present

## 2018-03-16 DIAGNOSIS — Z7901 Long term (current) use of anticoagulants: Secondary | ICD-10-CM | POA: Diagnosis not present

## 2018-03-16 DIAGNOSIS — Z931 Gastrostomy status: Secondary | ICD-10-CM | POA: Diagnosis not present

## 2018-03-16 DIAGNOSIS — I480 Paroxysmal atrial fibrillation: Secondary | ICD-10-CM | POA: Diagnosis not present

## 2018-03-16 DIAGNOSIS — M25462 Effusion, left knee: Secondary | ICD-10-CM | POA: Diagnosis present

## 2018-03-16 DIAGNOSIS — D6481 Anemia due to antineoplastic chemotherapy: Secondary | ICD-10-CM | POA: Diagnosis not present

## 2018-03-16 DIAGNOSIS — D630 Anemia in neoplastic disease: Secondary | ICD-10-CM | POA: Diagnosis present

## 2018-03-16 DIAGNOSIS — I872 Venous insufficiency (chronic) (peripheral): Secondary | ICD-10-CM | POA: Diagnosis present

## 2018-03-16 DIAGNOSIS — Z923 Personal history of irradiation: Secondary | ICD-10-CM

## 2018-03-16 DIAGNOSIS — R609 Edema, unspecified: Secondary | ICD-10-CM

## 2018-03-16 DIAGNOSIS — M170 Bilateral primary osteoarthritis of knee: Secondary | ICD-10-CM | POA: Diagnosis present

## 2018-03-16 LAB — COMPREHENSIVE METABOLIC PANEL
ALT: 26 U/L (ref 17–63)
AST: 17 U/L (ref 15–41)
Albumin: 2.9 g/dL — ABNORMAL LOW (ref 3.5–5.0)
Alkaline Phosphatase: 64 U/L (ref 38–126)
Anion gap: 10 (ref 5–15)
BUN: 26 mg/dL — AB (ref 6–20)
CHLORIDE: 96 mmol/L — AB (ref 101–111)
CO2: 32 mmol/L (ref 22–32)
CREATININE: 0.77 mg/dL (ref 0.61–1.24)
Calcium: 9.2 mg/dL (ref 8.9–10.3)
Glucose, Bld: 205 mg/dL — ABNORMAL HIGH (ref 65–99)
Potassium: 4.6 mmol/L (ref 3.5–5.1)
SODIUM: 138 mmol/L (ref 135–145)
Total Bilirubin: 0.6 mg/dL (ref 0.3–1.2)
Total Protein: 6.6 g/dL (ref 6.5–8.1)

## 2018-03-16 LAB — LACTIC ACID, PLASMA: Lactic Acid, Venous: 1.1 mmol/L (ref 0.5–1.9)

## 2018-03-16 LAB — CBC WITH DIFFERENTIAL/PLATELET
Basophils Absolute: 0 10*3/uL (ref 0.0–0.1)
Basophils Relative: 0 %
Eosinophils Absolute: 0 10*3/uL (ref 0.0–0.7)
Eosinophils Relative: 0 %
HCT: 29.9 % — ABNORMAL LOW (ref 39.0–52.0)
HEMOGLOBIN: 9.5 g/dL — AB (ref 13.0–17.0)
LYMPHS ABS: 0.6 10*3/uL — AB (ref 0.7–4.0)
Lymphocytes Relative: 14 %
MCH: 29.9 pg (ref 26.0–34.0)
MCHC: 31.8 g/dL (ref 30.0–36.0)
MCV: 94 fL (ref 78.0–100.0)
MONOS PCT: 19 %
Monocytes Absolute: 0.8 10*3/uL (ref 0.1–1.0)
NEUTROS PCT: 67 %
Neutro Abs: 2.7 10*3/uL (ref 1.7–7.7)
Platelets: 265 10*3/uL (ref 150–400)
RBC: 3.18 MIL/uL — AB (ref 4.22–5.81)
RDW: 18.8 % — ABNORMAL HIGH (ref 11.5–15.5)
WBC: 4 10*3/uL (ref 4.0–10.5)

## 2018-03-16 LAB — I-STAT CG4 LACTIC ACID, ED: Lactic Acid, Venous: 2.33 mmol/L (ref 0.5–1.9)

## 2018-03-16 LAB — BRAIN NATRIURETIC PEPTIDE: B Natriuretic Peptide: 78.7 pg/mL (ref 0.0–100.0)

## 2018-03-16 LAB — GLUCOSE, CAPILLARY
GLUCOSE-CAPILLARY: 119 mg/dL — AB (ref 65–99)
Glucose-Capillary: 129 mg/dL — ABNORMAL HIGH (ref 65–99)

## 2018-03-16 LAB — I-STAT TROPONIN, ED: Troponin i, poc: 0.01 ng/mL (ref 0.00–0.08)

## 2018-03-16 MED ORDER — PIPERACILLIN-TAZOBACTAM 3.375 G IVPB
3.3750 g | Freq: Three times a day (TID) | INTRAVENOUS | Status: DC
  Administered 2018-03-16 – 2018-03-20 (×10): 3.375 g via INTRAVENOUS
  Filled 2018-03-16 (×10): qty 50

## 2018-03-16 MED ORDER — DILTIAZEM LOAD VIA INFUSION
10.0000 mg | Freq: Once | INTRAVENOUS | Status: AC
  Administered 2018-03-16: 10 mg via INTRAVENOUS
  Filled 2018-03-16: qty 10

## 2018-03-16 MED ORDER — AZITHROMYCIN 250 MG PO TABS: 500.0000 mg | ORAL_TABLET | Freq: Every day | ORAL | Status: DC

## 2018-03-16 MED ORDER — SODIUM CHLORIDE 0.9% FLUSH: 10.0000 mL | INTRAVENOUS | Status: DC | PRN

## 2018-03-16 MED ORDER — INSULIN ASPART 100 UNIT/ML ~~LOC~~ SOLN
0.0000 [IU] | Freq: Three times a day (TID) | SUBCUTANEOUS | Status: DC
  Administered 2018-03-17 – 2018-03-18 (×4): 3 [IU] via SUBCUTANEOUS
  Administered 2018-03-18 (×2): 8 [IU] via SUBCUTANEOUS

## 2018-03-16 MED ORDER — SODIUM CHLORIDE 0.9 % IV BOLUS
500.0000 mL | Freq: Once | INTRAVENOUS | Status: AC
  Administered 2018-03-16: 500 mL via INTRAVENOUS

## 2018-03-16 MED ORDER — SYSTANE CONTACTS OP SOLN: 1.0000 [drp] | Freq: Every day | OPHTHALMIC | Status: DC

## 2018-03-16 MED ORDER — JEVITY 1.2 CAL PO LIQD
1000.0000 mL | ORAL | Status: DC
  Administered 2018-03-17: 1000 mL
  Filled 2018-03-16: qty 1000

## 2018-03-16 MED ORDER — METOPROLOL TARTRATE 25 MG PO TABS
25.0000 mg | ORAL_TABLET | Freq: Two times a day (BID) | ORAL | Status: DC
  Administered 2018-03-17 – 2018-03-22 (×12): 25 mg via ORAL
  Filled 2018-03-16 (×13): qty 1

## 2018-03-16 MED ORDER — GUAIFENESIN-DM 100-10 MG/5ML PO SYRP
10.0000 mL | ORAL_SOLUTION | ORAL | Status: DC | PRN
  Administered 2018-03-18 – 2018-03-20 (×2): 10 mL via ORAL
  Filled 2018-03-16 (×3): qty 10

## 2018-03-16 MED ORDER — GADOBENATE DIMEGLUMINE 529 MG/ML IV SOLN
20.0000 mL | Freq: Once | INTRAVENOUS | Status: AC | PRN
  Administered 2018-03-16: 19 mL via INTRAVENOUS

## 2018-03-16 MED ORDER — PRAVASTATIN SODIUM 40 MG PO TABS
40.0000 mg | ORAL_TABLET | Freq: Every evening | ORAL | Status: DC
  Administered 2018-03-17 – 2018-03-22 (×6): 40 mg via ORAL
  Filled 2018-03-16: qty 2
  Filled 2018-03-16: qty 1
  Filled 2018-03-16: qty 2
  Filled 2018-03-16: qty 1
  Filled 2018-03-16 (×3): qty 2

## 2018-03-16 MED ORDER — ALLOPURINOL 100 MG PO TABS
100.0000 mg | ORAL_TABLET | Freq: Every day | ORAL | Status: DC
  Administered 2018-03-18 – 2018-03-22 (×5): 100 mg via ORAL
  Filled 2018-03-16 (×5): qty 1

## 2018-03-16 MED ORDER — POLYVINYL ALCOHOL 1.4 % OP SOLN
1.0000 [drp] | Freq: Every day | OPHTHALMIC | Status: DC
  Administered 2018-03-17 – 2018-03-19 (×4): 1 [drp] via OPHTHALMIC
  Filled 2018-03-16 (×2): qty 15

## 2018-03-16 MED ORDER — SODIUM CHLORIDE 0.9 % IV SOLN: 1.0000 g | INTRAVENOUS | Status: DC

## 2018-03-16 MED ORDER — LORAZEPAM 2 MG/ML IJ SOLN
0.5000 mg | Freq: Once | INTRAMUSCULAR | Status: AC
  Administered 2018-03-16: 0.5 mg via INTRAVENOUS

## 2018-03-16 MED ORDER — LORAZEPAM 2 MG/ML IJ SOLN
INTRAMUSCULAR | Status: AC
  Administered 2018-03-16: 0.5 mg via INTRAVENOUS
  Filled 2018-03-16: qty 1

## 2018-03-16 MED ORDER — PIPERACILLIN-TAZOBACTAM 3.375 G IVPB 30 MIN: 3.3750 g | Freq: Once | INTRAVENOUS | Status: AC

## 2018-03-16 MED ORDER — VITAMIN B-12 1000 MCG PO TABS
500.0000 ug | ORAL_TABLET | Freq: Two times a day (BID) | ORAL | Status: DC
Start: 2018-03-16 — End: 2018-03-22
  Administered 2018-03-17 – 2018-03-22 (×12): 500 ug via ORAL
  Filled 2018-03-16 (×12): qty 1

## 2018-03-16 MED ORDER — DILTIAZEM HCL-DEXTROSE 100-5 MG/100ML-% IV SOLN (PREMIX)
5.0000 mg/h | INTRAVENOUS | Status: DC
  Administered 2018-03-16: 5 mg/h via INTRAVENOUS
  Administered 2018-03-17: 12.5 mg/h via INTRAVENOUS
  Administered 2018-03-17: 11 mg/h via INTRAVENOUS
  Administered 2018-03-17: 5 mg/h via INTRAVENOUS
  Administered 2018-03-18: 15 mg/h via INTRAVENOUS
  Administered 2018-03-19: 7.5 mg/h via INTRAVENOUS
  Filled 2018-03-16 (×8): qty 100

## 2018-03-16 MED ORDER — INSULIN ASPART 100 UNIT/ML ~~LOC~~ SOLN
0.0000 [IU] | Freq: Every day | SUBCUTANEOUS | Status: DC
  Administered 2018-03-17: 2 [IU] via SUBCUTANEOUS
  Administered 2018-03-18: 5 [IU] via SUBCUTANEOUS

## 2018-03-16 MED ORDER — APIXABAN 5 MG PO TABS
5.0000 mg | ORAL_TABLET | Freq: Two times a day (BID) | ORAL | Status: DC
  Administered 2018-03-17 – 2018-03-22 (×12): 5 mg via ORAL
  Filled 2018-03-16 (×13): qty 1

## 2018-03-16 MED ORDER — GLYCOPYRROLATE 1 MG PO TABS: 1.0000 mg | ORAL_TABLET | Freq: Three times a day (TID) | ORAL | Status: DC

## 2018-03-16 MED ORDER — SCOPOLAMINE 1 MG/3DAYS TD PT72
1.0000 | MEDICATED_PATCH | TRANSDERMAL | Status: DC
  Administered 2018-03-17 – 2018-03-19 (×2): 1.5 mg via TRANSDERMAL
  Filled 2018-03-16 (×4): qty 1

## 2018-03-16 MED ORDER — FUROSEMIDE 10 MG/ML IJ SOLN
40.0000 mg | Freq: Two times a day (BID) | INTRAMUSCULAR | Status: DC
  Administered 2018-03-16 – 2018-03-18 (×5): 40 mg via INTRAVENOUS
  Filled 2018-03-16 (×5): qty 4

## 2018-03-16 NOTE — ED Provider Notes (Signed)
[facility] DEPT Provider Note   CSN: [id number] Arrival date & time: 03/16/18  0850     History   Chief Complaint Chief Complaint  Patient presents with  . Leg Pain  . Extremity Weakness    HPI Minor Iden is a 72 y.o. male.  The history is provided by the patient and the spouse. No language interpreter was used.  Leg Pain    Extremity Weakness     Cauy Melody is a 72 y.o. male who presents to the Emergency Department complaining of weakness. He presents to the emergency department accompanied by his wife for evaluation of progressive weakness and lower extremity edema for the last three days. He was recently admitted to the hospital for hypoxic respiratory failure secondary to throat cancer. He was improved at the time of hospital discharge but did have some lower extremity edema. Over the last week the edema has progressed and he has experienced progressive generalized weakness, greatest in the legs and particularly the left leg. For the last three days he has been unable to get up without assistance and is now unable to ambulate. He has frequent coughing and choking but this is unchanged from baseline. He denies any chest pain, abdominal pain, leg pain. He does endorse a temperature to 100.2 at home with occasional vomiting. Denies back pain or posterior neck pain.  Past Medical History:  Diagnosis Date  . Allergic rhinitis   . Atrial fibrillation ([place])   . Diabetes mellitus without complication ([place])   . 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 HLD (hyperlipidemia) 02/21/2018  . Hyperlipidemia   . Hypertension   . Prostate cancer ([place])   . T2DM (type 2 diabetes mellitus) ([place]) 02/21/2018    Patient Active Problem List   Diagnosis Date Noted  . Lower extremity edema 03/16/2018  . Lactic acidosis 03/16/2018  . Left-sided weakness 03/16/2018  . PNA (pneumonia) 03/16/2018  .  Anemia associated with chemotherapy 03/16/2018  . Weakness 03/16/2018  . Aspiration pneumonia ([place]) 03/03/2018  . Acute hypoxemic respiratory failure ([place]) 03/03/2018  . Pressure injury of skin 03/03/2018  . Throat cancer ([place])   . Dermatitis   . Neutropenic fever ([place])   . Sepsis ([place])   . HLD (hyperlipidemia) 02/21/2018  . T2DM (type 2 diabetes mellitus) ([place]) 02/21/2018  . Squamous cell carcinoma of base of tongue ([place]) 01/01/2018  . Atrial flutter ([place]) 05/14/2017  . Chronic anticoagulation 05/14/2017  . Essential hypertension 05/14/2017  . Paroxysmal atrial fibrillation ([place]) 05/13/2017    Past Surgical History:  Procedure Laterality Date  . KNEE SURGERY Bilateral    2007 and 1998,   . PROSTATECTOMY  12/15/2006  . SKIN GRAFT     as a child left elbow  . TONSILECTOMY/ADENOIDECTOMY WITH MYRINGOTOMY     as a child        Home Medications    Prior to Admission medications   Medication Sig Start Date End Date Taking? Authorizing Provider  acetaminophen (TYLENOL) 500 MG tablet Take 1,000 mg by mouth every 6 (six) hours as needed for fever.   Yes [provider]  allopurinol (ZYLOPRIM) 100 MG tablet Take 100 mg by mouth daily.   Yes [provider]  apixaban (ELIQUIS) 5 MG TABS tablet Take 5 mg by mouth 2 (two) times daily.   Yes [provider]  Artificial Tear Solution (SYSTANE CONTACTS) SOLN Place 1 drop  into both eyes daily.    Yes [provider]  cyanocobalamin 500 MCG tablet Take 500 mcg by mouth 2 (two) times daily.   Yes [provider]  dexamethasone (DECADRON) 4 MG tablet Take 2 tablets (8 mg total) by mouth daily. Start the day after chemotherapy for 2 days. 01/08/18  Yes Perlov, Marinell Blight, MD  glipiZIDE (GLUCOTROL) 5 MG tablet Take 2.5-5 mg by mouth See admin instructions. Take 1 tablet before breakfast and Take 1/2 tablet before dinner.   Yes [provider]  lidocaine-prilocaine (EMLA) cream Apply to affected  area once Patient taking differently: Apply 1 application topically as needed.  01/08/18  Yes Perlov, Marinell Blight, MD  LORazepam (ATIVAN) 0.5 MG tablet Take 1 tablet (0.5 mg total) by mouth every 6 (six) hours as needed (Nausea or vomiting). Patient taking differently: Take 0.5 mg by mouth every 6 (six) hours as needed for anxiety (Nausea and Vomiting).  02/18/18  Yes [name], MD  metFORMIN (GLUCOPHAGE) 1000 MG tablet Take 1,000 mg by mouth 2 (two) times daily with a meal.   Yes [provider]  metoprolol tartrate (LOPRESSOR) 25 MG tablet Place 1 tablet (25 mg total) into feeding tube 2 (two) times daily. 03/08/18  Yes Alma Friendly, MD  pravastatin (PRAVACHOL) 40 MG tablet Take 40 mg by mouth every evening.    Yes [provider]  scopolamine (TRANSDERM-SCOP) 1 MG/3DAYS Place 1 patch (1.5 mg total) onto the skin every 3 (three) days. 03/09/18  Yes Alma Friendly, MD  sodium fluoride (FLUORISHIELD) 1.1 % GEL dental gel Instill one drop of gel per tooth space of fluoride tray. Place over teeth for 5 minutes. Remove. Spit out excess. Repeat nightly. Patient taking differently: Place 1 application onto teeth at bedtime. Instill one drop of gel per tooth space of fluoride tray. Place over teeth for 5 minutes. Remove. Spit out excess. Repeat nightly. 01/01/18  Yes Lenn Cal, DDS    Family History Family History  Problem Relation Age of Onset  . Lung cancer Mother   . Hypertension Mother   . Breast cancer Mother   . Heart attack Father   . Hypertension Sister   . Hypertension Brother   . Hypertension Sister     Social History Social History   Tobacco Use  . Smoking status: Never Smoker  . Smokeless tobacco: Never Used  Substance Use Topics  . Alcohol use: Yes    Comment: reports occasional beer or wine. maybe two times monthly  . Drug use: No     Allergies   Patient has no known allergies.   Review of Systems Review of Systems    Musculoskeletal: Positive for extremity weakness.  All other systems reviewed and are negative.    Physical Exam Updated Vital Signs BP 138/85 (BP Location: Left Arm)   Pulse (!) 130 Comment: rn aware  Temp 97.9 F (36.6 C)   Resp (!) 22   Wt 89.9 kg (198 lb 3.1 oz)   SpO2 (!) 89%   BMI 29.27 kg/m   Physical Exam  Constitutional: He is oriented to person, place, and time. He appears well-developed and well-nourished.  HENT:  Head: Normocephalic and atraumatic.  Cardiovascular:  No murmur heard. Tachycardic and irregular  Pulmonary/Chest: Effort normal. No respiratory distress.  Occasional rhonchi. Frequent coughing and choking episodes  Abdominal: Soft. There is no tenderness. There is no rebound and no guarding.  Musculoskeletal: He exhibits no tenderness.  2+ pitting edema to  bilateral lower extremities. 2+ DP pulses bilaterally  Neurological: He is alert and oriented to person, place, and time.  4 to 5 strength and bilateral upper extremities. 4 to 5 strength in the right lower extremity, 3/5 strength in the left lower extremity.  Skin: Skin is warm and dry.  Psychiatric: He has a normal mood and affect. His behavior is normal.  Nursing note and vitals reviewed.    ED Treatments / Results  Labs (all labs ordered are listed, but only abnormal results are displayed) Labs Reviewed  COMPREHENSIVE METABOLIC PANEL - Abnormal; Notable for the following components:      Result Value   Chloride 96 (*)    Glucose, Bld 205 (*)    BUN 26 (*)    Albumin 2.9 (*)    All other components within normal limits  CBC WITH DIFFERENTIAL/PLATELET - Abnormal; Notable for the following components:   RBC 3.18 (*)    Hemoglobin 9.5 (*)    HCT 29.9 (*)    RDW 18.8 (*)    Lymphs Abs 0.6 (*)    All other components within normal limits  I-STAT CG4 LACTIC ACID, ED - Abnormal; Notable for the following components:   Lactic Acid, Venous 2.33 (*)    All other components within normal  limits  BRAIN NATRIURETIC PEPTIDE  LACTIC ACID, PLASMA  LACTIC ACID, PLASMA  CBC  BASIC METABOLIC PANEL  I-STAT TROPONIN, ED    EKG EKG Interpretation  Date/Time:  Saturday March 16 2018 10:16:21 EDT Ventricular Rate:  109 PR Interval:    QRS Duration: 90 QT Interval:  339 QTC Calculation: 399 R Axis:   45 Text Interpretation:  Atrial fibrillation Minimal ST depression, diffuse leads Confirmed by Quintella Reichert [phone]) on 03/16/2018 10:20:33 AM Also confirmed by Quintella Reichert [phone]), editor Lynder Parents [phone])  on 03/16/2018 10:56:46 AM   Radiology Dg Chest 2 View  Result Date: 03/16/2018 CLINICAL DATA:  Cough for 2 months.  History of throat carcinoma EXAM: CHEST - 2 VIEW COMPARISON:  Mar 08, 2018 FINDINGS: There is a small left pleural effusion with left base atelectatic change. Right lung is clear. Heart is upper normal in size with pulmonary vascularity normal. No adenopathy. Port-A-Cath tip is cavoatrial junction. No pneumothorax. There is degenerative change in the thoracic spine. IMPRESSION: Small left pleural effusion with left base atelectasis. Early pneumonia in the left base cannot be excluded. Lungs elsewhere clear. Stable cardiac silhouette. Port-A-Cath tip at cavoatrial junction. Electronically Signed   By: Lowella Grip III M.D.   On: 03/16/2018 10:32   Ct Head Wo Contrast  Result Date: 03/16/2018 CLINICAL DATA:  Lower extremity weakness. History of throat carcinoma EXAM: CT HEAD WITHOUT CONTRAST TECHNIQUE: Contiguous axial images were obtained from the base of the skull through the vertex without intravenous contrast. COMPARISON:  None. FINDINGS: Brain: There is moderate diffuse atrophy. There is no intracranial mass, hemorrhage, extra-axial fluid collection, or midline shift. There is patchy small vessel disease throughout the centra semiovale bilaterally. Elsewhere gray-white compartments appear normal. No evident acute infarct. Vascular: No hyperdense vessels.  There is calcification in each carotid siphon as well as in the distal vertebral arteries. Skull: Bony calvarium appears intact. Sinuses/Orbits: There is a small retention cyst in the anterior left maxillary antrum. There is mucosal thickening in several ethmoid air cells. There is opacification in a somewhat hypoplastic left frontal sinus. Orbits appear symmetric bilaterally. Other: There is opacification multiple mastoid air cells bilaterally. IMPRESSION: Atrophy with patchy periventricular small  vessel disease. No evident acute infarct. No mass or hemorrhage. There are foci of arterial vascular calcification. Areas of paranasal sinus and mastoid disease noted. Electronically Signed   By: Lowella Grip III M.D.   On: 03/16/2018 10:02   Mr Jeri Cos NT Contrast  Result Date: 03/16/2018 CLINICAL DATA:  72 year old male with bilateral lower extremity pain, swelling, and weakness for 2 days. EXAM: MRI HEAD WITHOUT AND WITH CONTRAST TECHNIQUE: Multiplanar, multiecho pulse sequences of the brain and surrounding structures were obtained without and with intravenous contrast. CONTRAST:  49mL MULTIHANCE GADOBENATE DIMEGLUMINE 529 MG/ML IV SOLN COMPARISON:  Head CT without contrast 03/16/2018. CT neck 02/25/2018. FINDINGS: Brain: No restricted diffusion to suggest acute infarction. No midline shift, mass effect, evidence of mass lesion, ventriculomegaly, extra-axial collection or acute intracranial hemorrhage. Cervicomedullary junction and pituitary are within normal limits. Cerebral volume is within normal limits for age. Patchy bilateral moderate for age cerebral white matter T2 and FLAIR hyperintensity in a nonspecific configuration. Similar patchy T2 hyperintensity in the pons. No cortical encephalomalacia or chronic cerebral blood products identified. Probable small perivascular space in the right thalamus (normal variant). The other deep gray matter nuclei and the cerebellum appear normal. No abnormal enhancement  identified. No dural thickening Vascular: Major intracranial vascular flow voids are preserved. Generalized intracranial artery tortuosity. The major dural venous sinuses are enhancing and appear patent. Skull and upper cervical spine: Negative visible cervical spine. Normal Skolnick marrow signal. Sinuses/Orbits: Normal orbits soft tissues. Paranasal sinuses are clear. Other: Small volume of layering secretions in the visible pharynx. There are bilateral mastoid air cell effusions. Visible internal auditory structures otherwise appear normal. Scalp and face soft tissues appear negative. IMPRESSION: 1.  No acute intracranial abnormality. 2. Moderate for age signal changes in the cerebral white matter and pons, nonspecific but most commonly due to chronic small vessel disease. 3. Retained secretions in the pharynx with bilateral mastoid air cell effusions. Electronically Signed   By: Genevie Ann M.D.   On: 03/16/2018 16:25    Procedures Procedures (including critical care time)  Medications Ordered in ED Medications  furosemide (LASIX) injection 40 mg (has no administration in time range)  insulin aspart (novoLOG) injection 0-15 Units (has no administration in time range)  insulin aspart (novoLOG) injection 0-5 Units (has no administration in time range)  allopurinol (ZYLOPRIM) tablet 100 mg (has no administration in time range)  apixaban (ELIQUIS) tablet 5 mg (has no administration in time range)  vitamin B-12 (CYANOCOBALAMIN) tablet 500 mcg (has no administration in time range)  metoprolol tartrate (LOPRESSOR) tablet 25 mg (has no administration in time range)  pravastatin (PRAVACHOL) tablet 40 mg (has no administration in time range)  scopolamine (TRANSDERM-SCOP) 1 MG/3DAYS 1.5 mg (has no administration in time range)  piperacillin-tazobactam (ZOSYN) IVPB 3.375 g (has no administration in time range)  guaiFENesin-dextromethorphan (ROBITUSSIN DM) 100-10 MG/5ML syrup 10 mL (has no administration in time  range)  piperacillin-tazobactam (ZOSYN) IVPB 3.375 g (has no administration in time range)  polyvinyl alcohol (LIQUIFILM TEARS) 1.4 % ophthalmic solution 1 drop (has no administration in time range)  sodium chloride 0.9 % bolus 500 mL (0 mLs Intravenous Stopped 03/16/18 1500)  LORazepam (ATIVAN) injection 0.5 mg (0.5 mg Intravenous Given 03/16/18 1530)  gadobenate dimeglumine (MULTIHANCE) injection 20 mL (19 mLs Intravenous Contrast Given 03/16/18 1603)     Initial Impression / Assessment and Plan / ED Course  I have reviewed the triage vital signs and the nursing notes.  Pertinent labs & imaging  results that were available during my care of the patient were reviewed by me and considered in my medical decision making (see chart for details).     Patient with history of throat cancer here for progressive lower extremity edema as well as generalized weakness. He does have generalized weakness on examination, greatest in the left lower extremity. No reports of neck or back pain. CT head negative for metastatic disease. He is mildly orthostatic in the department, will treat with IV fluids.  He is unable to stand without assistance.  Hospitalist consulted for admission for deconditioning, profound weakness, dehydration.    Final Clinical Impressions(s) / ED Diagnoses   Final diagnoses:  None    ED Discharge Orders    None       Quintella Reichert, MD 03/16/18 1758

## 2018-03-16 NOTE — Progress Notes (Signed)
Pt to be transferred to 4th floor tele and cardizem drip to be started.  Pt wife and bedside and is aware. Nurse attempted to call report but was unable to talk to receiving nurse due to shift change. Night nurse given report on current unit.

## 2018-03-16 NOTE — Progress Notes (Signed)
Alerted MD to pt being in rapid A.Fib with HR in 130's. Awaiting response.

## 2018-03-16 NOTE — ED Notes (Signed)
Patient still at MRI. RN has given report to Cabin crew on [place] for room 1516. When patient comes back to ED RN transport patient upstairs.

## 2018-03-16 NOTE — Progress Notes (Signed)
Report given to Jenny Reichmann for 1432 -  pt will transfer once personal hygiene is complete

## 2018-03-16 NOTE — ED Notes (Signed)
Patient transported to MRI 

## 2018-03-16 NOTE — ED Triage Notes (Addendum)
Pt c/o bilat leg and feet pain with weakness x 2 days.  Pt been having issues with bilat feet swelling that has been going on since in hospital recently.  Pt just finished antibiotics.  Pt sees cancer center for throat cancer, recently finished treatments.

## 2018-03-16 NOTE — Progress Notes (Signed)
Pharmacy Antibiotic Note  Edgar Herrera is a 72 y.o. male admitted on 03/16/2018 with suspected aspiration pneumonia.  Pharmacy has been consulted for Zosyn dosing.  Plan:  Zosyn 3.375g IV Q8H infused over 4hrs.   Follow up renal fxn, culture results, and clinical course.  F/u ability to de-escalate antibiotics.      Temp (24hrs), Avg:98.2 F (36.8 C), Min:98.2 F (36.8 C), Max:98.2 F (36.8 C)  Recent Labs  Lab 03/16/18 1001 03/16/18 1034  WBC 4.0  --   CREATININE 0.77  --   LATICACIDVEN  --  2.33*    Estimated Creatinine Clearance: 91.2 mL/min (by C-G formula based on SCr of 0.77 mg/dL).    No Known Allergies  Antimicrobials this admission: 6/8 Zosyn >>   Dose adjustments this admission:   Microbiology results:   Thank you for allowing pharmacy to be a part of this patient's care.  Gretta Arab PharmD, BCPS Pager [phone] 03/16/2018 3:05 PM

## 2018-03-16 NOTE — ED Notes (Signed)
Patient transported to CT 

## 2018-03-16 NOTE — H&P (Addendum)
History and Physical    Edgar Herrera IOX:[id number] DOB: [birth year] DOA: 03/16/2018  PCP: [facility]   Patient coming from: Home    Chief Complaint: Increased weakness, inability to ambulate, lower extremity edema  HPI: Edgar Herrera is a 72 y.o. male with medical history significant of paroxysmal A. fib, diabetes type 2, hyperlipidemia, hypertension, squamous cell carcinoma base of tongue, status post PEG tube placement who presents to the emergency department with complaints of inability to walk, increased weakness mainly in the left side and severe bilateral lower extremity edema. Patient was just discharged on 03/08/2018 at that time he was managed for acute respiratory failure due to aspiration pneumonia.  Patient's cancer was diagnosed about 2 months ago.  He follows with oncology and is currently on radiation treatment and chemotherapy.  He was on systemic treatment with carboplatin and paclitaxel.  Before the cancer diagnosis, he was at independent and physically active and used to run several miles a week.  Currently he has been severely deconditioned.  Since last week, patient has been unable to walk.  He has noticed severe bilateral lower extremity edema.  He has noticed that he is more weak on his left side.  He has been trying to ambulate with the help of walker.  In addition, patient also has bothering cough and has noticed mild grade fever and chills at home.  Patient seen and examined the bedside in the emergency department.  He was hemodynamically stable, alert and oriented.   ED Course: Chest x-ray could not rule out pneumonia so antibiotics started.  Review of Systems: As per HPI otherwise 10 point review of systems negative.    Past Medical History:  Diagnosis Date  . Allergic rhinitis   . Atrial fibrillation ([place])   . Diabetes mellitus without complication ([place])   . History of radiation therapy 01/16/18- 03/08/18   HN-Base of tongue/ 70 Gy delivered in 35 fractions of 2  Gy  . HLD (hyperlipidemia) 02/21/2018  . Hyperlipidemia   . Hypertension   . Prostate cancer ([place])   . T2DM (type 2 diabetes mellitus) ([place]) 02/21/2018    Past Surgical History:  Procedure Laterality Date  . KNEE SURGERY Bilateral    2007 and 1998,   . PROSTATECTOMY  12/15/2006  . SKIN GRAFT     as a child left elbow  . TONSILECTOMY/ADENOIDECTOMY WITH MYRINGOTOMY     as a child     reports that he has never smoked. He has never used smokeless tobacco. He reports that he drinks alcohol. He reports that he does not use drugs.  No Known Allergies  Family History  Problem Relation Age of Onset  . Lung cancer Mother   . Hypertension Mother   . Breast cancer Mother   . Heart attack Father   . Hypertension Sister   . Hypertension Brother   . Hypertension Sister      Prior to Admission medications   Medication Sig Start Date End Date Taking? Authorizing Provider  acetaminophen (TYLENOL) 500 MG tablet Take 1,000 mg by mouth every 6 (six) hours as needed for fever.   Yes [provider]  allopurinol (ZYLOPRIM) 100 MG tablet Take 100 mg by mouth daily.   Yes [provider]  apixaban (ELIQUIS) 5 MG TABS tablet Take 5 mg by mouth 2 (two) times daily.   Yes [provider]  Artificial Tear Solution (SYSTANE CONTACTS) SOLN Place 1 drop into both eyes daily.    Yes [provider]  cyanocobalamin 500 MCG tablet Take 500 mcg by mouth 2 (two) times daily.   Yes [provider]  dexamethasone (DECADRON) 4 MG tablet Take 2 tablets (8 mg total) by mouth daily. Start the day after chemotherapy for 2 days. 01/08/18  Yes Perlov, Marinell Blight, MD  glipiZIDE (GLUCOTROL) 5 MG tablet Take 2.5-5 mg by mouth See admin instructions. Take 1 tablet before breakfast and Take 1/2 tablet before dinner.   Yes [provider]  lidocaine-prilocaine (EMLA) cream Apply to affected area once Patient taking differently: Apply 1 application topically as needed.   01/08/18  Yes Perlov, Marinell Blight, MD  LORazepam (ATIVAN) 0.5 MG tablet Take 1 tablet (0.5 mg total) by mouth every 6 (six) hours as needed (Nausea or vomiting). Patient taking differently: Take 0.5 mg by mouth every 6 (six) hours as needed for anxiety (Nausea and Vomiting).  02/18/18  Yes [name], MD  metFORMIN (GLUCOPHAGE) 1000 MG tablet Take 1,000 mg by mouth 2 (two) times daily with a meal.   Yes [provider]  metoprolol tartrate (LOPRESSOR) 25 MG tablet Place 1 tablet (25 mg total) into feeding tube 2 (two) times daily. 03/08/18  Yes Alma Friendly, MD  pravastatin (PRAVACHOL) 40 MG tablet Take 40 mg by mouth every evening.    Yes [provider]  scopolamine (TRANSDERM-SCOP) 1 MG/3DAYS Place 1 patch (1.5 mg total) onto the skin every 3 (three) days. 03/09/18  Yes Alma Friendly, MD  sodium fluoride (FLUORISHIELD) 1.1 % GEL dental gel Instill one drop of gel per tooth space of fluoride tray. Place over teeth for 5 minutes. Remove. Spit out excess. Repeat nightly. Patient taking differently: Place 1 application onto teeth at bedtime. Instill one drop of gel per tooth space of fluoride tray. Place over teeth for 5 minutes. Remove. Spit out excess. Repeat nightly. 01/01/18  Yes Lenn Cal, DDS    Physical Exam: Vitals:   03/16/18 1300 03/16/18 1330 03/16/18 1400 03/16/18 1430  BP: 115/68 111/62 (!) 114/57 (!) 131/46  Pulse:   (!) 101 (!) 105  Resp:   20 16  Temp:      TempSrc:      SpO2:   97% 100%    Constitutional: Not in acute distress Vitals:   03/16/18 1300 03/16/18 1330 03/16/18 1400 03/16/18 1430  BP: 115/68 111/62 (!) 114/57 (!) 131/46  Pulse:   (!) 101 (!) 105  Resp:   20 16  Temp:      TempSrc:      SpO2:   97% 100%   Eyes: PERRL, lids and conjunctivae normal ENMT: Mucous membranes are moist.  Neck: normal, supple, no masses, no thyromegaly Respiratory: Decreased entry, bilateral basal crackles Cardiovascular: Irregularly  irregular, no murmurs / rubs / gallops. 2-3 + lower extremity edema. 2+ pedal pulses. No carotid bruits.  Abdomen: no tenderness, no masses palpated. No hepatosplenomegaly. Bowel sounds positive.  PEG tube Musculoskeletal: no clubbing / cyanosis. No joint deformity upper and lower extremities. Good ROM, no contractures. Normal muscle tone.  Skin: no rashes, lesions, ulcers. No induration Neurologic: CN 2-12 grossly intact. Sensation intact.Power 5/5 on the right,3+/5 on left lower extremity and 4 /5 on the left upper extremity  psychiatric: Normal judgment and insight. Alert and oriented x 3. Normal mood.  Foley Catheter:None  Labs on Admission: I have personally reviewed following labs and imaging studies  CBC: Recent Labs  Lab 03/16/18 1001  WBC 4.0  NEUTROABS 2.7  HGB 9.5*  HCT  29.9*  MCV 94.0  PLT 979   Basic Metabolic Panel: Recent Labs  Lab 03/16/18 1001  NA 138  K 4.6  CL 96*  CO2 32  GLUCOSE 205*  BUN 26*  CREATININE 0.77  CALCIUM 9.2   GFR: Estimated Creatinine Clearance: 91.2 mL/min (by C-G formula based on SCr of 0.77 mg/dL). Liver Function Tests: Recent Labs  Lab 03/16/18 1001  AST 17  ALT 26  ALKPHOS 64  BILITOT 0.6  PROT 6.6  ALBUMIN 2.9*   No results for input(s): LIPASE, AMYLASE in the last 168 hours. No results for input(s): AMMONIA in the last 168 hours. Coagulation Profile: No results for input(s): INR, PROTIME in the last 168 hours. Cardiac Enzymes: No results for input(s): CKTOTAL, CKMB, CKMBINDEX, TROPONINI in the last 168 hours. BNP (last 3 results) No results for input(s): PROBNP in the last 8760 hours. HbA1C: No results for input(s): HGBA1C in the last 72 hours. CBG: No results for input(s): GLUCAP in the last 168 hours. Lipid Profile: No results for input(s): CHOL, HDL, LDLCALC, TRIG, CHOLHDL, LDLDIRECT in the last 72 hours. Thyroid Function Tests: No results for input(s): TSH, T4TOTAL, FREET4, T3FREE, THYROIDAB in the last 72  hours. Anemia Panel: No results for input(s): VITAMINB12, FOLATE, FERRITIN, TIBC, IRON, RETICCTPCT in the last 72 hours. Urine analysis:    Component Value Date/Time   COLORURINE YELLOW 03/05/2018 2044   APPEARANCEUR CLEAR 03/05/2018 2044   LABSPEC 1.028 03/05/2018 2044   PHURINE 5.0 03/05/2018 2044   GLUCOSEU >=500 (A) 03/05/2018 2044   HGBUR NEGATIVE 03/05/2018 2044   BILIRUBINUR NEGATIVE 03/05/2018 2044   KETONESUR 5 (A) 03/05/2018 2044   PROTEINUR 30 (A) 03/05/2018 2044   NITRITE NEGATIVE 03/05/2018 2044   LEUKOCYTESUR NEGATIVE 03/05/2018 2044    Radiological Exams on Admission: Dg Chest 2 View  Result Date: 03/16/2018 CLINICAL DATA:  Cough for 2 months.  History of throat carcinoma EXAM: CHEST - 2 VIEW COMPARISON:  Mar 08, 2018 FINDINGS: There is a small left pleural effusion with left base atelectatic change. Right lung is clear. Heart is upper normal in size with pulmonary vascularity normal. No adenopathy. Port-A-Cath tip is cavoatrial junction. No pneumothorax. There is degenerative change in the thoracic spine. IMPRESSION: Small left pleural effusion with left base atelectasis. Early pneumonia in the left base cannot be excluded. Lungs elsewhere clear. Stable cardiac silhouette. Port-A-Cath tip at cavoatrial junction. Electronically Signed   By: Lowella Grip III M.D.   On: 03/16/2018 10:32   Ct Head Wo Contrast  Result Date: 03/16/2018 CLINICAL DATA:  Lower extremity weakness. History of throat carcinoma EXAM: CT HEAD WITHOUT CONTRAST TECHNIQUE: Contiguous axial images were obtained from the base of the skull through the vertex without intravenous contrast. COMPARISON:  None. FINDINGS: Brain: There is moderate diffuse atrophy. There is no intracranial mass, hemorrhage, extra-axial fluid collection, or midline shift. There is patchy small vessel disease throughout the centra semiovale bilaterally. Elsewhere gray-white compartments appear normal. No evident acute infarct.  Vascular: No hyperdense vessels. There is calcification in each carotid siphon as well as in the distal vertebral arteries. Skull: Bony calvarium appears intact. Sinuses/Orbits: There is a small retention cyst in the anterior left maxillary antrum. There is mucosal thickening in several ethmoid air cells. There is opacification in a somewhat hypoplastic left frontal sinus. Orbits appear symmetric bilaterally. Other: There is opacification multiple mastoid air cells bilaterally. IMPRESSION: Atrophy with patchy periventricular small vessel disease. No evident acute infarct. No mass or hemorrhage. There are  foci of arterial vascular calcification. Areas of paranasal sinus and mastoid disease noted. Electronically Signed   By: Lowella Grip III M.D.   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  Patient is unable to walk since last 1 week which is a new finding.  Needs to rule out brain metastasis.  Needs to rule out a stroke.  Will order MRI of the brain.  Patient denies any back pain.  PT evaluation.  CT head done in the emergency department did not show any acute intracranial abnormalities.  Severe bilateral lower extremity edema: No history of CHF.  Echocardiogram done on last admission on 03/08/18 shows ejection fraction of 55 to 60%, mild left ventricular hypertrophy, no regional wall motion abnormality, could not fully evaluate left ventricular diastolic dysfunction.  Started on IV Lasix.  Squamous cell carcinoma of the base of tongue: Status post PEG tube.  Follows with oncology  as an outpatient.  Status post 35th cycle of radiation therapy and fifth cycle of chemotherapy.He was following with Dr. Lebron Conners.  Possible pneumonia: Chest x-ray could not rule out already pneumonia in the left base.  Start on zosyn for now as patient is immunocompromised.  Low suspicion for MRSA pneumonia so vancomycin not started.This could actuallybe an aspiration pneumonia due to persistent increased oropharyngeal secretion.  Patient also has bothering cough. Reported of mild grade fever at home.  No leukocytosis.    Lactic acidosis: Mild.  We will continue to monitor the level.  He was given 500 mL of fluid in the emergency department.  Patient has severe bilateral lower extremity edema and bilateral basal crackles.  Will stop fluids.  We will give few doses of diuretics.  If lactic acid level goes up, we can consider fluid.  Diabetes mellitus: Continue sliding-scale insulin for now.  On metformin and glipizide at home.  Last hemoglobin A1c was 7.7.  Anemia of chronic disease/malignancy/chemotherapy: Currently H&H is stable.  Continue to monitor.  Hypertension: Currently blood pressure stable.  Will resume his home medications.  We will continue to monitor his blood pressure.  Persistent oropharyngeal secretion: Continue scopolamine.  This could also be the reason for possible aspiration pneumonia.    Paroxysmal A. fib: Currently rate is controlled.Continue metoprolol, Eliquis.  Status post PEG: Continue tube feeds.  Hyperlipidemia: Continue statin.  Severity of Illness: The appropriate patient status for this patient is INPATIENT.  DVT prophylaxis: SCD Code Status: Full Family Communication: None present at the bedside Consults called: None  Please Note: This patient record was dictated using Editor, commissioning. Chart creation errors have been sought, but may not always have been located. Such creation errors do not reflect on the Standard of Medical Care.    Shelly Coss  MD [facility] Pager [phone]  If 7PM-7AM, please contact night-coverage [email or web address] Password [facility]  03/16/2018, 2:49 PM

## 2018-03-16 NOTE — ED Notes (Signed)
Patient transported to Ultrasound 

## 2018-03-16 NOTE — ED Notes (Signed)
Hospitalist at bedside with patient.

## 2018-03-16 NOTE — ED Notes (Signed)
Patient not able to ambulate. He had to be held up to obtain orthostatic vitals. Patient's wife states patient has not been able to walk the last few days.

## 2018-03-17 ENCOUNTER — Other Ambulatory Visit: Payer: Self-pay

## 2018-03-17 ENCOUNTER — Inpatient Hospital Stay (HOSPITAL_COMMUNITY): Payer: Medicare Other

## 2018-03-17 DIAGNOSIS — J189 Pneumonia, unspecified organism: Secondary | ICD-10-CM

## 2018-03-17 DIAGNOSIS — E785 Hyperlipidemia, unspecified: Secondary | ICD-10-CM

## 2018-03-17 DIAGNOSIS — M25462 Effusion, left knee: Secondary | ICD-10-CM

## 2018-03-17 DIAGNOSIS — M25562 Pain in left knee: Secondary | ICD-10-CM

## 2018-03-17 DIAGNOSIS — R600 Localized edema: Secondary | ICD-10-CM

## 2018-03-17 DIAGNOSIS — E872 Acidosis: Secondary | ICD-10-CM

## 2018-03-17 LAB — RESPIRATORY PANEL BY PCR
Adenovirus: NOT DETECTED
BORDETELLA PERTUSSIS-RVPCR: NOT DETECTED
CHLAMYDOPHILA PNEUMONIAE-RVPPCR: NOT DETECTED
CORONAVIRUS 229E-RVPPCR: NOT DETECTED
Coronavirus HKU1: NOT DETECTED
Coronavirus NL63: NOT DETECTED
Coronavirus OC43: NOT DETECTED
INFLUENZA B-RVPPCR: NOT DETECTED
Influenza A: NOT DETECTED
MYCOPLASMA PNEUMONIAE-RVPPCR: NOT DETECTED
Metapneumovirus: NOT DETECTED
Parainfluenza Virus 1: NOT DETECTED
Parainfluenza Virus 2: NOT DETECTED
Parainfluenza Virus 3: NOT DETECTED
Parainfluenza Virus 4: NOT DETECTED
Respiratory Syncytial Virus: NOT DETECTED
Rhinovirus / Enterovirus: NOT DETECTED

## 2018-03-17 LAB — GLUCOSE, CAPILLARY
COMMENT 1: NEGATIVE
GLUCOSE-CAPILLARY: 171 mg/dL — AB (ref 65–99)
Glucose-Capillary: 186 mg/dL — ABNORMAL HIGH (ref 65–99)
Glucose-Capillary: 181 mg/dL — ABNORMAL HIGH (ref 65–99)
Glucose-Capillary: 207 mg/dL — ABNORMAL HIGH (ref 65–99)

## 2018-03-17 LAB — CBC
HCT: 26.3 % — ABNORMAL LOW (ref 39.0–52.0)
Hemoglobin: 8.4 g/dL — ABNORMAL LOW (ref 13.0–17.0)
MCH: 29.6 pg (ref 26.0–34.0)
MCHC: 31.9 g/dL (ref 30.0–36.0)
MCV: 92.6 fL (ref 78.0–100.0)
PLATELETS: 250 10*3/uL (ref 150–400)
RBC: 2.84 MIL/uL — ABNORMAL LOW (ref 4.22–5.81)
RDW: 18.9 % — AB (ref 11.5–15.5)
WBC: 4 10*3/uL (ref 4.0–10.5)

## 2018-03-17 LAB — BASIC METABOLIC PANEL
Anion gap: 12 (ref 5–15)
BUN: 20 mg/dL (ref 6–20)
CALCIUM: 8.6 mg/dL — AB (ref 8.9–10.3)
CO2: 33 mmol/L — ABNORMAL HIGH (ref 22–32)
CREATININE: 0.88 mg/dL (ref 0.61–1.24)
Chloride: 93 mmol/L — ABNORMAL LOW (ref 101–111)
Glucose, Bld: 153 mg/dL — ABNORMAL HIGH (ref 65–99)
Potassium: 4.1 mmol/L (ref 3.5–5.1)
SODIUM: 138 mmol/L (ref 135–145)

## 2018-03-17 LAB — LACTIC ACID, PLASMA: LACTIC ACID, VENOUS: 0.8 mmol/L (ref 0.5–1.9)

## 2018-03-17 LAB — MRSA PCR SCREENING: MRSA BY PCR: NEGATIVE

## 2018-03-17 MED ORDER — DICLOFENAC SODIUM 1 % TD GEL
2.0000 g | Freq: Four times a day (QID) | TRANSDERMAL | Status: DC
  Administered 2018-03-17 – 2018-03-22 (×17): 2 g via TOPICAL
  Filled 2018-03-17: qty 100

## 2018-03-17 MED ORDER — LEVALBUTEROL HCL 0.63 MG/3ML IN NEBU: 0.6300 mg | INHALATION_SOLUTION | RESPIRATORY_TRACT | Status: DC | PRN

## 2018-03-17 MED ORDER — ORAL CARE MOUTH RINSE
15.0000 mL | Freq: Two times a day (BID) | OROMUCOSAL | Status: DC
  Administered 2018-03-17 – 2018-03-22 (×8): 15 mL via OROMUCOSAL

## 2018-03-17 MED ORDER — LEVALBUTEROL HCL 0.63 MG/3ML IN NEBU
0.6300 mg | INHALATION_SOLUTION | Freq: Three times a day (TID) | RESPIRATORY_TRACT | Status: DC
  Administered 2018-03-17 – 2018-03-21 (×11): 0.63 mg via RESPIRATORY_TRACT
  Filled 2018-03-17 (×12): qty 3

## 2018-03-17 MED ORDER — IPRATROPIUM BROMIDE 0.02 % IN SOLN
0.5000 mg | Freq: Four times a day (QID) | RESPIRATORY_TRACT | Status: DC
  Administered 2018-03-17: 0.5 mg via RESPIRATORY_TRACT
  Filled 2018-03-17: qty 2.5

## 2018-03-17 MED ORDER — CHLORHEXIDINE GLUCONATE 0.12 % MT SOLN
15.0000 mL | Freq: Two times a day (BID) | OROMUCOSAL | Status: DC
  Administered 2018-03-17 – 2018-03-22 (×9): 15 mL via OROMUCOSAL
  Filled 2018-03-17 (×11): qty 15

## 2018-03-17 MED ORDER — IPRATROPIUM BROMIDE 0.02 % IN SOLN
0.5000 mg | Freq: Three times a day (TID) | RESPIRATORY_TRACT | Status: DC
  Administered 2018-03-17 – 2018-03-21 (×11): 0.5 mg via RESPIRATORY_TRACT
  Filled 2018-03-17 (×12): qty 2.5

## 2018-03-17 MED ORDER — MORPHINE SULFATE (PF) 2 MG/ML IV SOLN
1.0000 mg | INTRAVENOUS | Status: DC | PRN
  Administered 2018-03-17 – 2018-03-20 (×5): 1 mg via INTRAVENOUS
  Filled 2018-03-17 (×5): qty 1

## 2018-03-17 MED ORDER — LEVALBUTEROL HCL 0.63 MG/3ML IN NEBU
0.6300 mg | INHALATION_SOLUTION | Freq: Four times a day (QID) | RESPIRATORY_TRACT | Status: DC
  Administered 2018-03-17: 0.63 mg via RESPIRATORY_TRACT
  Filled 2018-03-17: qty 3

## 2018-03-17 NOTE — Progress Notes (Signed)
PROGRESS NOTE    Edgar Herrera  XBJ:[id number] DOB: [birth year] DOA: 03/16/2018 PCP: [facility]   Brief Narrative:  Edgar Herrera is a 72 y.o. male with medical history significant of paroxysmal A. fib, diabetes type 2, hyperlipidemia, hypertension, squamous cell carcinoma base of tongue, status post PEG tube placement who presents to the emergency department with complaints of inability to walk, increased weakness mainly in the left side and severe bilateral lower extremity edema.  Patient was just discharged on 03/08/2018 at that time he was managed for acute respiratory failure due to aspiration pneumonia.  Patient's cancer was diagnosed about 2 months ago.  He follows with oncology and is currently on radiation treatment and chemotherapy.  He was on systemic treatment with carboplatin and paclitaxel.  Before the cancer diagnosis, he was at independent and physically active and used to run several miles a week.   Currently he has been severely deconditioned.  Since last week, patient has been unable to walk.  He has noticed severe bilateral lower extremity edema.  He has noticed that he is more weak on his left side.  He has been trying to ambulate with the help of walker.  In addition, patient also has bothering cough and has noticed mild grade fever and chills at home.  Patient seen and examined the bedside in the emergency department.  He was hemodynamically stable, alert and oriented.  Admitted for weakness and found to have significant swelling of Left knee. Orthopedics Consulted for Joint Aspiration.  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   -Patient is unable to walk since last 1 week which is a new finding and ? Related to Knee.   -NeedEd to rule out brain metastasis and needed to rule out a stroke so MRI of the brain was ordered.   -CT head done in the emergency department did not show any acute intracranial abnormalities. -MRI of Brain showed No acute Intracranial Abnormalities -Patient denies any back pain.   -PT Evaluation pending.    Left Knee Swelling and Pain; ? Gout vs. Osteoarthritis -Will obtain X-Ray of Left Knee -X-Ray showed Moderate Osteoarthritic change and a Large Joint Effusion; There was bony overgrowth along the inferior patella and no fracture or dislocation noted -Will order Knee Aspiration and send Fluid for analysis  -Is on Allopurinol 100 mg po Daily but will check Uric Acid Level in AM  -Discussed with Dr. Lennette Bihari Haddix of Orthopedics and patient will be seen in AM for Aspiration   Severe Bilateral Lower Extremity Edema:  -No history of CHF.  Echocardiogram done on last admission on 03/08/18 shows ejection fraction of 55 to 60%, mild left ventricular hypertrophy, no regional wall motion abnormality, could not fully evaluate left ventricular diastolic dysfunction.   -Started on IV Lasix 40 mg q12h  Squamous cell carcinoma of the base of tongue: -Status post PEG tube.   -C/w TF with Jevity 1.2 Cal at 40 mL/hr -Follows with oncology as an outpatient.   -Status post 35th cycle of radiation therapy and fifth cycle of Chemotherapy -He was following  with Dr. Lebron Conners and will need to find out new Oncologist   Suspected Pneumonia -Chest x-ray could not rule out already pneumonia in the left base.   -Start on Zosyn for now as patient is immunocompromised.   -Low suspicion for MRSA pneumonia so vancomycin not started.This could actually be an aspiration pneumonia due to persistent increased oropharyngeal secretion. -Patient also has bothering  cough. -C/w Guaifenesin-Dextromethorphan 10 mL q4hprn -Reported of mild grade fever at home. No leukocytosis. -Started Incentive Spirometry and  Continue Xopenex/Atrovent TID -Check Respiratory Virus Panel and Place on Droplet Precautions  -Repeat CXR in AM   Lactic Acidosis -Mild but now Improved -He was given 500 mL of fluid in the emergency department.   -Patient has severe bilateral lower extremity edema and bilateral basal crackles.   -IVF Stopped.  -Started on IV Diuresis with Lasix -If lactic acid level goes up, we can consider fluid. -Lactic Acid went from 2.33 -> 1.1 -> 0.8 -Improved   Diabetes Mellitus -On metformin and glipizide at home.  -Last hemoglobin A1c was 7.7. -Hold Oral Medications -C/w Moderate Novolog SSI AC/HS -CBG's ranging from 119-171  Anemia of chronic disease/malignancy/chemotherapy:  -Hb/Hct went from 9.5/29.9 -> 8.4/26.3 -Continue to Monitor for S/Sx of Bleeding -Repeat CBC in AM   Hypertension -Currently blood pressures softer -Continue Metoprolol 25 mg po BID and Cardizem gtt -Continue to Monitor BP carefully   Persistent Oropharyngeal Secretion -Continue Scopolamine 1.5 mg TD Patch.  -This could also be the reason for possible aspiration pneumonia.   -C/w Yankauer Suction   Paroxysmal A. Fib with RVR -Went into A Fib with RVR yesterday -Started on Cardizem gtt and transferred to SDU -Cardizem gtt is weaning  -C/w Metoprolol 25 mg po BID  -C/w Anticoagulation with Apixaban 5 mg po q12h.  Status post PEG -Continue Tube Feeds.  Hyperlipidemia -Continue Pravastatin 40 mg po qHS  DVT prophylaxis: Anticoagulated with Apixaban Code Status: FULL CODE Family Communication: Discussed with Wife at bedside  Disposition Plan: Remain in SDU as patient is still on Cardizem gtt  Consultants:   Orthopedics Dr. Lennette Bihari Haddix    Procedures:  None  Antimicrobials: Anti-infectives (From admission, onward)   Start     Dose/Rate Route  Frequency Ordered Stop   03/16/18 2200  piperacillin-tazobactam (ZOSYN) IVPB 3.375 g     3.375 g 12.5 mL/hr over 240 Minutes Intravenous Every 8 hours 03/16/18 1513     03/16/18 1515  piperacillin-tazobactam (ZOSYN) IVPB 3.375 g     3.375 g 100 mL/hr over 30 Minutes Intravenous  Once 03/16/18 1512     03/16/18 1500  azithromycin (ZITHROMAX) tablet 500 mg  Status:  Discontinued     500 mg Oral Daily 03/16/18 1441 03/16/18 1501   03/16/18 1445  cefTRIAXone (ROCEPHIN) 1 g in sodium chloride 0.9 % 100 mL IVPB  Status:  Discontinued     1 g 200 mL/hr over 30 Minutes Intravenous Every 24 hours 03/16/18 1441 03/16/18 1501     Subjective: Seen and examined at bedside and states he is doing a little bit better.  Was able to cough up and clear his secretions with the Yankauer.  Denies any chest pain, nausea, vomiting.  Heart rate was irregularly irregular but he states that he could not feel it.  No lightheadedness or dizziness.  Legs extremely swollen and left knee had some pain.  Objective: Vitals:   03/17/18 0437 03/17/18 0543 03/17/18 0544 03/17/18 0630  BP:   112/65 120/61  Pulse:  77 78  Resp:   20 18  Temp:  98.6 F (37 C) 98.6 F (37 C)   TempSrc:  Oral Oral   SpO2: (!) 85%  92% 97%  Weight:      Height:        Intake/Output Summary (Last 24 hours) at 03/17/2018 0759 Last data filed at 03/16/2018 1500 Gross per 24 hour  Intake 500 ml  Output -  Net 500 ml   Filed Weights   03/16/18 1733 03/16/18 2226  Weight: 89.9 kg (198 lb 3.1 oz) 86.9 kg (191 lb 9.3 oz)   Examination: Physical Exam:  Constitutional: WN/WD, NAD and appears calm and comfortable Eyes: Lids and conjunctivae normal, sclerae anicteric  ENMT: External Ears, Nose appear normal. Grossly normal hearing.  Neck: Appears normal, supple, no cervical masses, normal ROM, no appreciable thyromegaly Respiratory: Diminished to auscultation bilaterally, no wheezing, rales, rhonchi or crackles. Normal respiratory effort  and patient is not tachypenic. No accessory muscle use but wearing supplemental O2 via [name] Cardiovascular: Irregularly Irregular and tachycardic, no murmurs / rubs / gallops. S1 and S2 auscultated. 2-3+ LE Edema with Left Knee swollen  Abdomen: Soft, non-tender, non-distended. No masses palpated. No appreciable hepatosplenomegaly. Bowel sounds positive x4. PEG in place GU: Deferred. Musculoskeletal: No clubbing / cyanosis of digits/nails. No joint deformity upper and lower extremities. Left Knee swollen Skin: No rashes, lesions, ulcers on a limited skin eval. No induration; Warm and dry.  Neurologic: CN 2-12 grossly intact with no focal deficits. Romberg sign and cerebellar reflexes not assessed. Diminished strength noted  Psychiatric: Normal judgment and insight. Alert and oriented x 3. Normal mood and appropriate affect.   Data Reviewed: I have personally reviewed following labs and imaging studies  CBC: Recent Labs  Lab 03/16/18 1001 03/17/18 0447  WBC 4.0 4.0  NEUTROABS 2.7  --   HGB 9.5* 8.4*  HCT 29.9* 26.3*  MCV 94.0 92.6  PLT 265 240   Basic Metabolic Panel: Recent Labs  Lab 03/16/18 1001 03/17/18 0447  NA 138 138  K 4.6 4.1  CL 96* 93*  CO2 32 33*  GLUCOSE 205* 153*  BUN 26* 20  CREATININE 0.77 0.88  CALCIUM 9.2 8.6*   GFR: Estimated Creatinine Clearance: 81.6 mL/min (by C-G formula based on SCr of 0.88 mg/dL). Liver Function Tests: Recent Labs  Lab 03/16/18 1001  AST 17  ALT 26  ALKPHOS 64  BILITOT 0.6  PROT 6.6  ALBUMIN 2.9*   No results for input(s): LIPASE, AMYLASE in the last 168 hours. No results for input(s): AMMONIA in the last 168 hours. Coagulation Profile: No results for input(s): INR, PROTIME in the last 168 hours. Cardiac Enzymes: No results for input(s): CKTOTAL, CKMB, CKMBINDEX, TROPONINI in the last 168 hours. BNP (last 3 results) No results for input(s): PROBNP in the last 8760 hours. HbA1C: No results for input(s): HGBA1C in the  last 72 hours. CBG: Recent Labs  Lab 03/16/18 1800 03/16/18 2137  GLUCAP 119* 129*   Lipid Profile: No results for input(s): CHOL, HDL, LDLCALC, TRIG, CHOLHDL, LDLDIRECT in the last 72 hours. Thyroid Function Tests: No results for input(s): TSH, T4TOTAL, FREET4, T3FREE, THYROIDAB in the last 72 hours. Anemia Panel: No results for input(s): VITAMINB12, FOLATE, FERRITIN, TIBC, IRON, RETICCTPCT in the last 72 hours. Sepsis Labs: Recent Labs  Lab 03/16/18 1034 03/16/18 2033 03/17/18 0447  LATICACIDVEN 2.33* 1.1 0.8    No results found for this or any previous visit (from the past 240 hour(s)).  Radiology Studies: Dg Chest 2 View  Result Date: 03/16/2018 CLINICAL DATA:  Cough for 2 months.  History of throat carcinoma EXAM: CHEST - 2 VIEW COMPARISON:  Mar 08, 2018 FINDINGS: There is a small left pleural effusion with left base atelectatic change. Right lung is clear. Heart is upper normal in size with pulmonary vascularity normal. No adenopathy. Port-A-Cath tip is cavoatrial junction. No pneumothorax. There is degenerative change in the thoracic spine. IMPRESSION: Small left pleural effusion with left base atelectasis. Early pneumonia in the left base cannot be excluded. Lungs elsewhere clear. Stable cardiac silhouette. Port-A-Cath tip at cavoatrial junction. Electronically Signed   By: Lowella Grip III M.D.   On: 03/16/2018 10:32   Ct Head Wo Contrast  Result Date: 03/16/2018 CLINICAL DATA:  Lower extremity weakness. History of throat carcinoma EXAM: CT HEAD WITHOUT CONTRAST TECHNIQUE: Contiguous axial images were obtained from the base of the skull through the vertex without intravenous contrast. COMPARISON:  None. FINDINGS: Brain: There is moderate diffuse atrophy. There is no intracranial mass, hemorrhage, extra-axial fluid collection, or midline shift. There is patchy small vessel disease throughout the centra semiovale bilaterally. Elsewhere gray-white compartments appear normal.  No evident acute infarct. Vascular: No hyperdense vessels. There is calcification in each carotid siphon as well as in the distal vertebral arteries. Skull: Bony calvarium appears intact. Sinuses/Orbits: There is a small retention cyst in the anterior left maxillary antrum. There is mucosal thickening in several ethmoid air cells. There is opacification in a somewhat hypoplastic left frontal sinus. Orbits appear symmetric bilaterally. Other: There is opacification multiple mastoid air cells bilaterally. IMPRESSION: Atrophy with patchy periventricular small vessel disease. No evident acute infarct. No mass or hemorrhage. There are foci of arterial vascular calcification. Areas of paranasal sinus and mastoid disease noted. Electronically Signed   By: Lowella Grip III M.D.   On: 03/16/2018 10:02   Mr Jeri Cos WP Contrast  Result Date: 03/16/2018 CLINICAL DATA:  72 year old male with bilateral lower extremity pain, swelling, and weakness for 2 days. EXAM: MRI HEAD WITHOUT AND WITH CONTRAST TECHNIQUE: Multiplanar, multiecho pulse sequences of the brain and surrounding structures were obtained without and with intravenous contrast. CONTRAST:  83mL MULTIHANCE GADOBENATE DIMEGLUMINE 529 MG/ML IV SOLN COMPARISON:  Head CT without contrast 03/16/2018. CT neck 02/25/2018. FINDINGS: Brain: No restricted diffusion to suggest acute infarction. No midline shift, mass effect, evidence of mass lesion, ventriculomegaly, extra-axial collection or acute intracranial hemorrhage. Cervicomedullary junction and pituitary are within normal limits. Cerebral volume is within normal limits for age. Patchy bilateral moderate for age cerebral white matter T2 and FLAIR hyperintensity in a nonspecific configuration. Similar patchy T2 hyperintensity in the pons. No cortical encephalomalacia or chronic cerebral blood products identified. Probable small perivascular space in the right thalamus (normal variant). The other deep gray matter  nuclei and the cerebellum appear normal. No abnormal enhancement identified. No dural thickening Vascular: Major intracranial vascular flow voids are preserved. Generalized intracranial artery tortuosity. The major dural venous sinuses are enhancing and appear patent. Skull and upper cervical spine: Negative visible cervical spine. Normal [place] marrow signal. Sinuses/Orbits: Normal orbits soft tissues. Paranasal sinuses are clear. Other: Small volume of layering secretions in the visible pharynx. There are bilateral mastoid air cell effusions. Visible internal auditory structures otherwise appear normal. Scalp and face soft tissues appear negative. IMPRESSION: 1.  No acute intracranial abnormality. 2. Moderate for age signal changes in the cerebral white matter and pons, nonspecific but most commonly due to chronic small vessel disease.  3. Retained secretions in the pharynx with bilateral mastoid air cell effusions. Electronically Signed   By: Genevie Ann M.D.   On: 03/16/2018 16:25   Scheduled Meds: . [START ON 03/18/2018] allopurinol  100 mg Oral Daily  . apixaban  5 mg Oral Q12H  . chlorhexidine  15 mL Mouth Rinse BID  . furosemide  40 mg Intravenous Q12H  . insulin aspart  0-15 Units Subcutaneous TID WC  . insulin aspart  0-5 Units Subcutaneous QHS  . mouth rinse  15 mL Mouth Rinse q12n4p  . metoprolol tartrate  25 mg Oral BID  . polyvinyl alcohol  1 drop Both Eyes Daily  . pravastatin  40 mg Oral QPM  . scopolamine  1 patch Transdermal Q72H  . vitamin B-12  500 mcg Oral BID   Continuous Infusions: . diltiazem (CARDIZEM) infusion 12.5 mg/hr (03/17/18 0400)  . feeding supplement (JEVITY 1.2 CAL) 1,000 mL (03/17/18 0038)  . piperacillin-tazobactam    . piperacillin-tazobactam (ZOSYN)  IV Stopped (03/17/18 0244)    LOS: 1 day   Kerney Elbe, DO [facility] Pager [phone]  If 7PM-7AM, please contact night-coverage [email or web address] Password [email or web address] 03/17/2018, 7:59 AM

## 2018-03-17 NOTE — Progress Notes (Signed)
PT Cancellation Note  Patient Details Name: Edgar Herrera MRN: [id number] DOB: [birth year]   Cancelled Treatment:     PT order received but eval deferred pending results of dopplers and X-ray of L knee.  Will follow.   Danny Yackley 03/17/2018, 12:34 PM

## 2018-03-17 NOTE — Progress Notes (Signed)
Last chemo 02/15/18 and last radiation 03/08/18 per patient's wife.

## 2018-03-18 ENCOUNTER — Encounter: Payer: Self-pay | Admitting: *Deleted

## 2018-03-18 ENCOUNTER — Inpatient Hospital Stay (HOSPITAL_COMMUNITY): Payer: Medicare Other

## 2018-03-18 DIAGNOSIS — M79609 Pain in unspecified limb: Secondary | ICD-10-CM

## 2018-03-18 LAB — COMPREHENSIVE METABOLIC PANEL WITH GFR
ALT: 27 U/L (ref 17–63)
AST: 20 U/L (ref 15–41)
Albumin: 2.4 g/dL — ABNORMAL LOW (ref 3.5–5.0)
Alkaline Phosphatase: 58 U/L (ref 38–126)
Anion gap: 10 (ref 5–15)
BUN: 20 mg/dL (ref 6–20)
CO2: 38 mmol/L — ABNORMAL HIGH (ref 22–32)
Calcium: 8.4 mg/dL — ABNORMAL LOW (ref 8.9–10.3)
Chloride: 91 mmol/L — ABNORMAL LOW (ref 101–111)
Creatinine, Ser: 0.89 mg/dL (ref 0.61–1.24)
GFR calc Af Amer: >60 mL/min
GFR calc non Af Amer: >60 mL/min
Glucose, Bld: 265 mg/dL — ABNORMAL HIGH (ref 65–99)
Potassium: 3.5 mmol/L (ref 3.5–5.1)
Sodium: 139 mmol/L (ref 135–145)
Total Bilirubin: 0.7 mg/dL (ref 0.3–1.2)
Total Protein: 5.9 g/dL — ABNORMAL LOW (ref 6.5–8.1)

## 2018-03-18 LAB — CBC WITH DIFFERENTIAL/PLATELET
BASOS ABS: 0 10*3/uL (ref 0.0–0.1)
BASOS PCT: 0 %
Eosinophils Absolute: 0 10*3/uL (ref 0.0–0.7)
Eosinophils Relative: 0 %
HEMATOCRIT: 26.4 % — AB (ref 39.0–52.0)
HEMOGLOBIN: 8.2 g/dL — AB (ref 13.0–17.0)
Lymphocytes Relative: 14 %
Lymphs Abs: 0.5 10*3/uL — ABNORMAL LOW (ref 0.7–4.0)
MCH: 29.1 pg (ref 26.0–34.0)
MCHC: 31.1 g/dL (ref 30.0–36.0)
MCV: 93.6 fL (ref 78.0–100.0)
Monocytes Absolute: 0.7 10*3/uL (ref 0.1–1.0)
Monocytes Relative: 19 %
NEUTROS PCT: 67 %
Neutro Abs: 2.5 10*3/uL (ref 1.7–7.7)
Platelets: 260 10*3/uL (ref 150–400)
RBC: 2.82 MIL/uL — ABNORMAL LOW (ref 4.22–5.81)
RDW: 18.3 % — AB (ref 11.5–15.5)
WBC: 3.7 10*3/uL — ABNORMAL LOW (ref 4.0–10.5)

## 2018-03-18 LAB — SYNOVIAL CELL COUNT + DIFF, W/ CRYSTALS
Monocyte-Macrophage-Synovial Fluid: 9 % — ABNORMAL LOW (ref 50–90)
Neutrophil, Synovial: 91 % — ABNORMAL HIGH (ref 0–25)
WBC, SYNOVIAL: 11275 /mm3 — AB (ref 0–200)

## 2018-03-18 LAB — GLUCOSE, CAPILLARY
GLUCOSE-CAPILLARY: 195 mg/dL — AB (ref 65–99)
GLUCOSE-CAPILLARY: 286 mg/dL — AB (ref 65–99)
GLUCOSE-CAPILLARY: 393 mg/dL — AB (ref 65–99)
Glucose-Capillary: 295 mg/dL — ABNORMAL HIGH (ref 65–99)
Glucose-Capillary: 391 mg/dL — ABNORMAL HIGH (ref 65–99)

## 2018-03-18 LAB — PHOSPHORUS: Phosphorus: 4.6 mg/dL (ref 2.5–4.6)

## 2018-03-18 LAB — MAGNESIUM: Magnesium: 1.8 mg/dL (ref 1.7–2.4)

## 2018-03-18 LAB — URIC ACID: Uric Acid, Serum: 2.5 mg/dL — ABNORMAL LOW (ref 4.4–7.6)

## 2018-03-18 MED ORDER — METHYLPREDNISOLONE ACETATE 40 MG/ML IJ SUSP
40.0000 mg | Freq: Once | INTRAMUSCULAR | Status: AC
  Administered 2018-03-18: 40 mg via INTRA_ARTICULAR
  Filled 2018-03-18: qty 1

## 2018-03-18 MED ORDER — PRO-STAT SUGAR FREE PO LIQD
30.0000 mL | Freq: Three times a day (TID) | ORAL | Status: DC
  Administered 2018-03-18 – 2018-03-22 (×13): 30 mL
  Filled 2018-03-18 (×13): qty 30

## 2018-03-18 MED ORDER — INSULIN ASPART 100 UNIT/ML ~~LOC~~ SOLN: 0.0000 [IU] | SUBCUTANEOUS | Status: DC

## 2018-03-18 MED ORDER — JUVEN PO PACK
1.0000 | PACK | Freq: Two times a day (BID) | ORAL | Status: DC
  Administered 2018-03-18 – 2018-03-22 (×9): 1
  Filled 2018-03-18 (×9): qty 1

## 2018-03-18 MED ORDER — CHLORHEXIDINE GLUCONATE CLOTH 2 % EX PADS
6.0000 | MEDICATED_PAD | Freq: Every day | CUTANEOUS | Status: DC
  Administered 2018-03-18 – 2018-03-21 (×4): 6 via TOPICAL

## 2018-03-18 MED ORDER — INSULIN ASPART 100 UNIT/ML ~~LOC~~ SOLN
0.0000 [IU] | SUBCUTANEOUS | Status: DC
  Administered 2018-03-18: 15 [IU] via SUBCUTANEOUS
  Administered 2018-03-19 (×2): 8 [IU] via SUBCUTANEOUS
  Administered 2018-03-19: 15 [IU] via SUBCUTANEOUS
  Administered 2018-03-19 (×3): 8 [IU] via SUBCUTANEOUS
  Administered 2018-03-20: 3 [IU] via SUBCUTANEOUS
  Administered 2018-03-20: 8 [IU] via SUBCUTANEOUS
  Administered 2018-03-20: 3 [IU] via SUBCUTANEOUS
  Administered 2018-03-20: 5 [IU] via SUBCUTANEOUS
  Administered 2018-03-20: 11 [IU] via SUBCUTANEOUS
  Administered 2018-03-20: 8 [IU] via SUBCUTANEOUS
  Administered 2018-03-21 (×3): 5 [IU] via SUBCUTANEOUS
  Administered 2018-03-21: 3 [IU] via SUBCUTANEOUS
  Administered 2018-03-21: 2 [IU] via SUBCUTANEOUS
  Administered 2018-03-22: 3 [IU] via SUBCUTANEOUS
  Administered 2018-03-22: 5 [IU] via SUBCUTANEOUS
  Administered 2018-03-22: 3 [IU] via SUBCUTANEOUS
  Administered 2018-03-22: 8 [IU] via SUBCUTANEOUS

## 2018-03-18 MED ORDER — SODIUM CHLORIDE 0.9% FLUSH
10.0000 mL | Freq: Two times a day (BID) | INTRAVENOUS | Status: DC
  Administered 2018-03-18 – 2018-03-19 (×3): 10 mL
  Administered 2018-03-20: 3 mL
  Administered 2018-03-21: 40 mL
  Administered 2018-03-21: 10 mL

## 2018-03-18 MED ORDER — BUPIVACAINE HCL (PF) 0.5 % IJ SOLN
10.0000 mL | Freq: Once | INTRAMUSCULAR | Status: AC
  Administered 2018-03-18: 10 mL
  Filled 2018-03-18: qty 10

## 2018-03-18 MED ORDER — JEVITY 1.5 CAL/FIBER PO LIQD
1000.0000 mL | ORAL | Status: DC
  Administered 2018-03-18 – 2018-03-21 (×4): 1000 mL
  Filled 2018-03-18 (×7): qty 1000

## 2018-03-18 MED ORDER — SODIUM CHLORIDE 0.9% FLUSH
10.0000 mL | INTRAVENOUS | Status: DC | PRN
  Administered 2018-03-19 – 2018-03-22 (×2): 10 mL
  Filled 2018-03-18 (×2): qty 40

## 2018-03-18 NOTE — Evaluation (Signed)
Physical Therapy Evaluation Patient Details Name: Edgar Herrera MRN: [id number] DOB: 12-23-1945 Today's Date: 03/18/2018   History of Present Illness  71 yo male admitted with LE weakness, inability to walk and LE edema.  Just d/c home 5/31 after admission for respiratory failure, was receiving HHPT. Hx of head/neck cancer, lymphedema, vertigo, gout, Afib, pressure injury, prostate cancer.   Clinical Impression  Patient presents with decreased independence with mobility due to pain, swelling (though improved s/p aspiration,) decreased activity tolerance, decreased cognition, decreased strength and balance.  He will benefit from skilled PT in the acute setting to allow return home with wife assist following SNF level rehab stay.  Currently mod A for sup>sit (+2 max for sit>supine,) and max A for partial stand from elevated bed.  HR remained stable through session, but pt fatigued sitting EOB and with standing attempts.  Wife requesting [facility] in [place] if possible for rehab.     Follow Up Recommendations SNF;Supervision/Assistance - 24 hour    Equipment Recommendations  3in1 (PT)    Recommendations for Other Services       Precautions / Restrictions        Mobility  Bed Mobility Overal bed mobility: Needs Assistance Bed Mobility: Supine to Sit;Sit to Supine     Supine to sit: Mod assist;HOB elevated Sit to supine: Max assist;+2 for physical assistance   General bed mobility comments: assist to guide legs to EOB, assist for trunk from elevated HOB; to spine assist for hips and trunk and to scoot to [facility]  Transfers Overall transfer level: Needs assistance Equipment used: Rolling walker (2 wheeled) Transfers: Sit to/from Stand Sit to Stand: Max assist         General transfer comment: attempted x 3, but pt unable to come upright, only finally able to lift hips from surface on third attempt, but then not able to extend  Ambulation/Gait                Stairs             Wheelchair Mobility    Modified Rankin (Stroke Patients Only)       Balance Overall balance assessment: Needs assistance Sitting-balance support: Feet supported Sitting balance-Leahy Scale: Fair Sitting balance - Comments: sat EOB about 15 minutes with S for safety due to mild lethargy       Standing balance comment: unable                             Pertinent Vitals/Pain Pain Assessment: 0-10 Pain Score: 4  Pain Location: R knee > L Pain Descriptors / Indicators: Grimacing;Aching Pain Intervention(s): Monitored during session;Repositioned;Limited activity within patient's tolerance    Home Living Family/patient expects to be discharged to:: Private residence Living Arrangements: Spouse/significant other Available Help at Discharge: Family Type of Home: Other(Comment)(condo) Home Access: Stairs to enter Entrance Stairs-Rails: None Technical brewer of Steps: 2 Home Layout: Able to live on main level with bedroom/bathroom Home Equipment: Walker - 2 wheels      Prior Function Level of Independence: Needs assistance   Gait / Transfers Assistance Needed: decline over past week with needing walker and finally not able to walk across the room  ADL's / Homemaking Assistance Needed: usually independent        Hand Dominance        Extremity/Trunk Assessment   Upper Extremity Assessment Upper Extremity Assessment: Generalized weakness    Lower Extremity Assessment Lower Extremity Assessment:  RLE deficits/detail;LLE deficits/detail RLE Deficits / Details: AAROM grossly 50 in supine, strength hip flexion 2+/5, knee extension 3/5, ankle DF limited AROM  RLE Sensation: history of peripheral neuropathy LLE Deficits / Details: AAROM grossly 60 in supine, strength hip flexion 3/5, knee extension 3+/5, ankle AROM limited DF LLE Sensation: history of peripheral neuropathy    Cervical / Trunk Assessment Cervical / Trunk Assessment: Kyphotic   Communication   Communication: No difficulties  Cognition Arousal/Alertness: Lethargic Behavior During Therapy: WFL for tasks assessed/performed Overall Cognitive Status: Impaired/Different from baseline Area of Impairment: Orientation;Following commands;Safety/judgement;Attention                 Orientation Level: Disoriented to;Place;Time;Situation Current Attention Level: Sustained   Following Commands: Follows one step commands with increased time Safety/Judgement: Decreased awareness of safety;Decreased awareness of deficits            General Comments General comments (skin integrity, edema, etc.): wife in room and supportive, prefers [facility] in [place] for rehab    Exercises General Exercises - Lower Extremity Ankle Circles/Pumps: AROM;5 reps;Both;Supine Heel Slides: AAROM;5 reps;Both;Supine   Assessment/Plan    PT Assessment Patient needs continued PT services  PT Problem List Decreased range of motion;Decreased activity tolerance;Decreased balance;Decreased knowledge of use of DME;Pain;Decreased strength;Decreased mobility;Decreased safety awareness;Decreased knowledge of precautions;Decreased cognition       PT Treatment Interventions DME instruction;Functional mobility training;Balance training;Patient/family education;Therapeutic activities;Gait training;Therapeutic exercise;Cognitive remediation    PT Goals (Current goals can be found in the Care Plan section)  Acute Rehab PT Goals Patient Stated Goal: agreeable to rehab PT Goal Formulation: With patient/family Time For Goal Achievement: 04/01/18 Potential to Achieve Goals: Good    Frequency Min 3X/week   Barriers to discharge        Co-evaluation               AM-PAC PT "6 Clicks" Daily Activity  Outcome Measure Difficulty turning over in bed (including adjusting bedclothes, sheets and blankets)?: Unable Difficulty moving from lying on back to sitting on the side of the bed? :  Unable Difficulty sitting down on and standing up from a chair with arms (e.g., wheelchair, bedside commode, etc,.)?: Unable Help needed moving to and from a bed to chair (including a wheelchair)?: Total Help needed walking in hospital room?: Total Help needed climbing 3-5 steps with a railing? : Total 6 Click Score: 6    End of Session Equipment Utilized During Treatment: Gait belt;Oxygen Activity Tolerance: Patient limited by fatigue;Patient limited by pain Patient left: with call bell/phone within reach;in bed;with family/visitor present   PT Visit Diagnosis: Muscle weakness (generalized) (M62.81);Other abnormalities of gait and mobility (R26.89);Pain Pain - Right/Left: (both) Pain - part of body: Knee    Time: 1275-1700 PT Time Calculation (min) (ACUTE ONLY): 37 min   Charges:   PT Evaluation $PT Eval Moderate Complexity: 1 Mod PT Treatments $Therapeutic Activity: 8-22 mins   PT G CodesMagda Kiel, [place] [phone] 03/18/2018   Reginia Naas 03/18/2018, 11:37 AM

## 2018-03-18 NOTE — Progress Notes (Signed)
PROGRESS NOTE    Edgar Herrera  YTK:[id number] DOB: [birth year] DOA: 03/16/2018 PCP: [facility]   Brief Narrative:  Edgar Herrera is a 72 y.o. male with medical history significant of paroxysmal A. fib, diabetes type 2, hyperlipidemia, hypertension, squamous cell carcinoma base of tongue, status post PEG tube placement who presents to the emergency department with complaints of inability to walk, increased weakness mainly in the left side and severe bilateral lower extremity edema.  Patient was just discharged on 03/08/2018 at that time he was managed for acute respiratory failure due to aspiration pneumonia.  Patient's cancer was diagnosed about 2 months ago.  He follows with oncology and is currently on radiation treatment and chemotherapy.  He was on systemic treatment with carboplatin and paclitaxel.  Before the cancer diagnosis, he was at independent and physically active and used to run several miles a week.   Currently he has been severely deconditioned.  Since last week, patient has been unable to walk.  He has noticed severe bilateral lower extremity edema.  He has noticed that he is more weak on his left side.  He has been trying to ambulate with the help of walker.  In addition, patient also has bothering cough and has noticed mild grade fever and chills at home.  Patient seen and examined the bedside in the emergency department.  He was hemodynamically stable, alert and oriented.  Admitted for weakness and found to have significant swelling of Left knee. Orthopedics Consulted for Joint Aspiration and both knees were aspirated and injected today. Has been intermittently in A Fib with RVR so remains in SDU on and off Cardizem gtt.   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   -Patient is unable to walk since last 1 week which is a new finding and ? Related to Knees.   -Needed to rule out brain metastasis and needed to rule out a stroke so MRI of the brain was ordered.   -CT head done in the emergency department did not show any acute intracranial abnormalities. -MRI of Brain showed No acute Intracranial Abnormalities -Patient denies any back pain.   -PT Evaluation recommending SNF.    Left Knee Swelling and Pain; ? Gout vs. Osteoarthritis -Will obtain X-Ray of Left Knee -X-Ray showed Moderate Osteoarthritic change and a Large Joint Effusion; There was bony overgrowth along the inferior patella and no fracture or dislocation noted -Will order Knee Aspiration and send Fluid for analysis  -Is on Allopurinol 100 mg po Daily but will check Uric Acid Level in AM  -Discussed with Dr. Lennette Bihari Haddix of Orthopedics and patient will be seen in AM for Aspiration -Orthopedics PA Sheran Lawless evaluated for bilateral knee pain and aspirated and injected both knees with Steroids -Knee Aspirate showed Turbid Appearance with 11,275 WBC and C/x pending -PT recommending SNF   Severe Bilateral Lower Extremity Edema:  -No history of CHF.  Echocardiogram done on last admission on 03/08/18 shows ejection fraction of 55 to 60%, mild left ventricular hypertrophy, no regional wall motion abnormality, could not fully evaluate  left ventricular diastolic dysfunction.   -Started on IV Lasix 40 mg q12h and will continue today and stop today -Edema is improved -Patient is -34 mL since admission and weight is down 7 lbs -Checking LE Duplex to r/o DVT and was negative for DVT -? Hypoalbuminemia as potential cause of edema  Squamous cell carcinoma of the base of tongue: -Status post PEG tube.     -C/w TF with Jevity 1.2 Cal at 40 mL/hr -Nutrition consulted and changed Rate and adding 30 mL Prostat TID and Juven Fruit Punch -Follows with oncology as an outpatient.   -Status post 35th cycle of radiation therapy and fifth cycle of Chemotherapy -He was following with Dr. Lebron Conners and will need to find out new Oncologist   Left Sided Pneumonia -Chest x-ray could not rule out already pneumonia in the left base.   -Started on Zosyn for now as patient is immunocompromised.   -Low suspicion for MRSA pneumonia so vancomycin not started.This could actually be an aspiration pneumonia due to persistent increased oropharyngeal secretion.  -Patient also has bothering cough. -C/w Guaifenesin-Dextromethorphan 10 mL q4hprn -Reported of mild grade fever at home. No leukocytosis. -Started Incentive Spirometry and  Continue Xopenex/Atrovent TID as patient is in and out of A Fib -Checked Respiratory Virus Panel and Place on Droplet Precautions but was Negative so Droplet Precautions discontinued  -Repeat CXR this AM showed Poor aeration with persistent opacity at the left lung base.  Lactic Acidosis -Mild but now Improved -He was given 500 mL of fluid in the emergency department.   -Patient has severe bilateral lower extremity edema and bilateral basal crackles.   -IVF Stopped.  -Started on IV Diuresis with Lasix -If lactic acid level goes up, we can consider fluid. -Lactic Acid went from 2.33 -> 1.1 -> 0.8 -Improved   Diabetes Mellitus -On metformin and glipizide at home.  -Last hemoglobin A1c was 7.7. -Hold Oral Medications -C/w Moderate Novolog SSI AC/HS -CBG's ranging from 195-286  Anemia of chronic disease/malignancy/chemotherapy:  -Hb/Hct went from 9.5/29.9 -> 8.4/26.3 -> 8.2/26.4 -Continue to Monitor for S/Sx of Bleeding -Repeat CBC in AM   Hypertension -Currently blood pressures softer -Continue Metoprolol 25 mg po BID and Cardizem gtt for now -Continue to Monitor BP carefully    Persistent Oropharyngeal Secretion -Continue Scopolamine 1.5 mg TD Patch.  -This could also be the reason for possible aspiration pneumonia.   -C/w Yankauer Suction   Paroxysmal A. Fib with RVR -Went into A Fib with RVR and is intermittently converting back and forth to NSR -Started on Cardizem gtt and transferred to SDU -Cardizem gtt is weaning and was titrated off today but went back into RVR -C/w Metoprolol 25 mg po BID  -C/w Anticoagulation with Apixaban 5 mg po q12h.  Status post PEG -Continue Tube Feeds. -Nutrition consulted and appreciate Recc's -Getting Jevity at 1.2 and rate increasing to 50 mL/hr and Nutritionist adding Prostat TID and Juven Fruit Punch  Hyperlipidemia -Continue Pravastatin 40 mg po qHS  DVT prophylaxis: Anticoagulated with Apixaban Code Status: FULL CODE Family Communication: No family present at bedside Disposition Plan: Remain in SDU as patient is still on Cardizem gtt and SNF at D/C when medically stable  Consultants:   Orthopedic Surgery   Procedures:  None  Antimicrobials: Anti-infectives (From admission, onward)   Start     Dose/Rate Route Frequency Ordered Stop   03/16/18 2200  piperacillin-tazobactam (ZOSYN) IVPB 3.375 g     3.375 g 12.5 mL/hr over 240 Minutes Intravenous Every  8 hours 03/16/18 1513     03/16/18 1515  piperacillin-tazobactam (ZOSYN) IVPB 3.375 g     3.375 g 100 mL/hr over 30 Minutes Intravenous  Once 03/16/18 1512 03/17/18 1609   03/16/18 1500  azithromycin (ZITHROMAX) tablet 500 mg  Status:  Discontinued     500 mg Oral Daily 03/16/18 1441 03/16/18 1501   03/16/18 1445  cefTRIAXone (ROCEPHIN) 1 g in sodium chloride 0.9 % 100 mL IVPB  Status:  Discontinued     1 g 200 mL/hr over 30 Minutes Intravenous Every 24 hours 03/16/18 1441 03/16/18 1501     Subjective: Seen and examined at bedside and was getting LE Duplex done. Was only oriented x1 this AM and thought he was in [place]. No CP or SOB. Felt ok. No other  concerns or complaints at this time.  Objective: Vitals:   03/18/18 0400 03/18/18 0500 03/18/18 0530 03/18/18 0600  BP: (!) 126/52 129/66  126/66  Pulse: 79 (!) 107 (!) 121 (!) 110  Resp: 17 16 15 18   Temp:      TempSrc:      SpO2: 95% 96% 95% 96%  Weight:      Height:        Intake/Output Summary (Last 24 hours) at 03/18/2018 0803 Last data filed at 03/18/2018 0600 Gross per 24 hour  Intake 1384.13 ml  Output 2500 ml  Net -1115.87 ml   Filed Weights   03/16/18 1733 03/16/18 2226  Weight: 89.9 kg (198 lb 3.1 oz) 86.9 kg (191 lb 9.3 oz)   Examination: Physical Exam:  Constitutional: WN/WD Caucasian male in NAD appears calm Eyes: Sclerae anicteric. Lids normal ENMT:  External ears and nose appear normal. Grossly normal hearing Neck: Appears supple with no JVD; Respiratory: Diminished to auscultation bilaterally but more so on left base. Normal respiratory effort and unlabored breathing Cardiovascular: Irregularly Irregular and slightly tachycardic. 1-2+ LE edema; Left knee swollen  Abdomen: Soft, NT, ND. Bowel sounds present and PEG in Place GU: Deferred Musculoskeletal: No contractures or cyanosis. Left knee swollen Skin: No rashes or lesions on a limited skin eval Neurologic: CN 2-12 grossly intact. Globally weak Psychiatric: Normal mood and affect. Awake and oriented x1. Slightly confused  Data Reviewed: I have personally reviewed following labs and imaging studies  CBC: Recent Labs  Lab 03/16/18 1001 03/17/18 0447 03/18/18 0443  WBC 4.0 4.0 3.7*  NEUTROABS 2.7  --  2.5  HGB 9.5* 8.4* 8.2*  HCT 29.9* 26.3* 26.4*  MCV 94.0 92.6 93.6  PLT 265 250 109   Basic Metabolic Panel: Recent Labs  Lab 03/16/18 1001 03/17/18 0447 03/18/18 0443  NA 138 138 139  K 4.6 4.1 3.5  CL 96* 93* 91*  CO2 32 33* 38*  GLUCOSE 205* 153* 265*  BUN 26* 20 20  CREATININE 0.77 0.88 0.89  CALCIUM 9.2 8.6* 8.4*  MG  --   --  1.8  PHOS  --   --  4.6   GFR: Estimated  Creatinine Clearance: 80.7 mL/min (by C-G formula based on SCr of 0.89 mg/dL). Liver Function Tests: Recent Labs  Lab 03/16/18 1001 03/18/18 0443  AST 17 20  ALT 26 27  ALKPHOS 64 58  BILITOT 0.6 0.7  PROT 6.6 5.9*  ALBUMIN 2.9* 2.4*   No results for input(s): LIPASE, AMYLASE in the last 168 hours. No results for input(s): AMMONIA in the last 168 hours. Coagulation Profile: No results for input(s): INR, PROTIME in the last 168 hours. Cardiac Enzymes:  No results for input(s): CKTOTAL, CKMB, CKMBINDEX, TROPONINI in the last 168 hours. BNP (last 3 results) No results for input(s): PROBNP in the last 8760 hours. HbA1C: No results for input(s): HGBA1C in the last 72 hours. CBG: Recent Labs  Lab 03/16/18 2137 03/17/18 0750 03/17/18 1212 03/17/18 1555 03/17/18 2115  GLUCAP 129* 171* 186* 181* 207*   Lipid Profile: No results for input(s): CHOL, HDL, LDLCALC, TRIG, CHOLHDL, LDLDIRECT in the last 72 hours. Thyroid Function Tests: No results for input(s): TSH, T4TOTAL, FREET4, T3FREE, THYROIDAB in the last 72 hours. Anemia Panel: No results for input(s): VITAMINB12, FOLATE, FERRITIN, TIBC, IRON, RETICCTPCT in the last 72 hours. Sepsis Labs: Recent Labs  Lab 03/16/18 1034 03/16/18 2033 03/17/18 0447  LATICACIDVEN 2.33* 1.1 0.8    Recent Results (from the past 240 hour(s))  MRSA PCR Screening     Status: None   Collection Time: 03/17/18  6:55 AM  Result Value Ref Range Status   MRSA by PCR NEGATIVE NEGATIVE Final    Comment:        The GeneXpert MRSA Assay (FDA approved for NASAL specimens only), is one component of a comprehensive MRSA colonization surveillance program. It is not intended to diagnose MRSA infection nor to guide or monitor treatment for MRSA infections. Performed at [facility], [place] [address], [place], [place] [address]   Respiratory Panel by PCR     Status: None   Collection Time: 03/17/18  9:12 AM  Result Value Ref Range  Status   Adenovirus NOT DETECTED NOT DETECTED Final   Coronavirus 229E NOT DETECTED NOT DETECTED Final   Coronavirus HKU1 NOT DETECTED NOT DETECTED Final   Coronavirus NL63 NOT DETECTED NOT DETECTED Final   Coronavirus OC43 NOT DETECTED NOT DETECTED Final   Metapneumovirus NOT DETECTED NOT DETECTED Final   Rhinovirus / Enterovirus NOT DETECTED NOT DETECTED Final   Influenza A NOT DETECTED NOT DETECTED Final   Influenza B NOT DETECTED NOT DETECTED Final   Parainfluenza Virus 1 NOT DETECTED NOT DETECTED Final   Parainfluenza Virus 2 NOT DETECTED NOT DETECTED Final   Parainfluenza Virus 3 NOT DETECTED NOT DETECTED Final   Parainfluenza Virus 4 NOT DETECTED NOT DETECTED Final   Respiratory Syncytial Virus NOT DETECTED NOT DETECTED Final   Bordetella pertussis NOT DETECTED NOT DETECTED Final   Chlamydophila pneumoniae NOT DETECTED NOT DETECTED Final   Mycoplasma pneumoniae NOT DETECTED NOT DETECTED Final    Comment: Performed at [facility], [place] [address], [place], [place] [address]     Radiology Studies: Dg Chest 2 View  Result Date: 03/16/2018 CLINICAL DATA:  Cough for 2 months.  History of throat carcinoma EXAM: CHEST - 2 VIEW COMPARISON:  Mar 08, 2018 FINDINGS: There is a small left pleural effusion with left base atelectatic change. Right lung is clear. Heart is upper normal in size with pulmonary vascularity normal. No adenopathy. Port-A-Cath tip is cavoatrial junction. No pneumothorax. There is degenerative change in the thoracic spine. IMPRESSION: Small left pleural effusion with left base atelectasis. Early pneumonia in the left base cannot be excluded. Lungs elsewhere clear. Stable cardiac silhouette. Port-A-Cath tip at cavoatrial junction. Electronically Signed   By: Lowella Grip III M.D.   On: 03/16/2018 10:32   Ct Head Wo Contrast  Result Date: 03/16/2018 CLINICAL DATA:  Lower extremity weakness. History of throat carcinoma EXAM: CT HEAD WITHOUT CONTRAST TECHNIQUE:  Contiguous axial images were obtained from the base of the skull through the vertex without  intravenous contrast. COMPARISON:  None. FINDINGS: Brain: There is moderate diffuse atrophy. There is no intracranial mass, hemorrhage, extra-axial fluid collection, or midline shift. There is patchy small vessel disease throughout the centra semiovale bilaterally. Elsewhere gray-white compartments appear normal. No evident acute infarct. Vascular: No hyperdense vessels. There is calcification in each carotid siphon as well as in the distal vertebral arteries. Skull: Bony calvarium appears intact. Sinuses/Orbits: There is a small retention cyst in the anterior left maxillary antrum. There is mucosal thickening in several ethmoid air cells. There is opacification in a somewhat hypoplastic left frontal sinus. Orbits appear symmetric bilaterally. Other: There is opacification multiple mastoid air cells bilaterally. IMPRESSION: Atrophy with patchy periventricular small vessel disease. No evident acute infarct. No mass or hemorrhage. There are foci of arterial vascular calcification. Areas of paranasal sinus and mastoid disease noted. Electronically Signed   By: Lowella Grip III M.D.   On: 03/16/2018 10:02   Mr Jeri Cos WC Contrast  Result Date: 03/16/2018 CLINICAL DATA:  71 year old male with bilateral lower extremity pain, swelling, and weakness for 2 days. EXAM: MRI HEAD WITHOUT AND WITH CONTRAST TECHNIQUE: Multiplanar, multiecho pulse sequences of the brain and surrounding structures were obtained without and with intravenous contrast. CONTRAST:  28mL MULTIHANCE GADOBENATE DIMEGLUMINE 529 MG/ML IV SOLN COMPARISON:  Head CT without contrast 03/16/2018. CT neck 02/25/2018. FINDINGS: Brain: No restricted diffusion to suggest acute infarction. No midline shift, mass effect, evidence of mass lesion, ventriculomegaly, extra-axial collection or acute intracranial hemorrhage. Cervicomedullary junction and pituitary are within  normal limits. Cerebral volume is within normal limits for age. Patchy bilateral moderate for age cerebral white matter T2 and FLAIR hyperintensity in a nonspecific configuration. Similar patchy T2 hyperintensity in the pons. No cortical encephalomalacia or chronic cerebral blood products identified. Probable small perivascular space in the right thalamus (normal variant). The other deep gray matter nuclei and the cerebellum appear normal. No abnormal enhancement identified. No dural thickening Vascular: Major intracranial vascular flow voids are preserved. Generalized intracranial artery tortuosity. The major dural venous sinuses are enhancing and appear patent. Skull and upper cervical spine: Negative visible cervical spine. Normal Guice marrow signal. Sinuses/Orbits: Normal orbits soft tissues. Paranasal sinuses are clear. Other: Small volume of layering secretions in the visible pharynx. There are bilateral mastoid air cell effusions. Visible internal auditory structures otherwise appear normal. Scalp and face soft tissues appear negative. IMPRESSION: 1.  No acute intracranial abnormality. 2. Moderate for age signal changes in the cerebral white matter and pons, nonspecific but most commonly due to chronic small vessel disease. 3. Retained secretions in the pharynx with bilateral mastoid air cell effusions. Electronically Signed   By: Genevie Ann M.D.   On: 03/16/2018 16:25   Dg Knee Complete 4 Views Left  Result Date: 03/17/2018 CLINICAL DATA:  Soft tissue swelling EXAM: LEFT KNEE - COMPLETE 4+ VIEW COMPARISON:  None. FINDINGS: Frontal, lateral, and bilateral oblique views were obtained. There is no appreciable fracture or dislocation. There is a large joint effusion. There is moderate narrowing of the lateral compartment and patellofemoral joint with slight narrowing medially. There is spurring in all compartments. There is bony overgrowth along the inferior patella. No erosive changes. IMPRESSION: Moderate  osteoarthritic change. Large joint effusion. 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 No fracture or dislocation. Electronically Signed   By: Lowella Grip III M.D.   On: 03/17/2018 11:42   Scheduled Meds: . allopurinol  100 mg Oral Daily  . apixaban  5 mg  Oral Q12H  . chlorhexidine  15 mL Mouth Rinse BID  . Chlorhexidine Gluconate Cloth  6 each Topical Daily  . diclofenac sodium  2 g Topical QID  . furosemide  40 mg Intravenous Q12H  . insulin aspart  0-15 Units Subcutaneous TID WC  . insulin aspart  0-5 Units Subcutaneous QHS  . ipratropium  0.5 mg Nebulization TID  . levalbuterol  0.63 mg Nebulization TID  . mouth rinse  15 mL Mouth Rinse q12n4p  . metoprolol tartrate  25 mg Oral BID  . polyvinyl alcohol  1 drop Both Eyes Daily  . pravastatin  40 mg Oral QPM  . scopolamine  1 patch Transdermal Q72H  . sodium chloride flush  10-40 mL Intracatheter Q12H  . vitamin B-12  500 mcg Oral BID   Continuous Infusions: . diltiazem (CARDIZEM) infusion 15 mg/hr (03/18/18 0600)  . feeding supplement (JEVITY 1.2 CAL) 40 mL/hr at 03/18/18 0600  . piperacillin-tazobactam (ZOSYN)  IV 3.375 g (03/18/18 0539)    LOS: 2 days   Kerney Elbe, DO [facility] Pager [phone]  If 7PM-7AM, please contact night-coverage [email or web address] Password [email or web address] 03/18/2018, 8:03 AM

## 2018-03-18 NOTE — Care Management Note (Signed)
Case Management Note  Patient Details  Name: Edgar Herrera MRN: [id number] Date of Birth: April 03, 1946  Subjective/Objective: Active w/[facility] HHPT. PT cons-await recc.                   Action/Plan:d/c home w/HHC.   Expected Discharge Date:                  Expected Discharge Plan:  [place]  In-House Referral:     Discharge planning Services     Post Acute Care Choice:  Home Health(Active w/[facility] HHPT) Choice offered to:     DME Arranged:    DME Agency:     HH Arranged:    [place] Agency:     Status of Service:  In process, will continue to follow  If discussed at Long Length of Stay Meetings, dates discussed:    Additional Comments:  Dessa Phi, RN 03/18/2018, 10:57 AM

## 2018-03-18 NOTE — Consult Note (Signed)
Reason for Consult:Left knee effusion Referring Physician: Kaedan Richert Edgar Herrera is an 72 y.o. male.  HPI: Edgar Herrera developed BLE edema a couple of weeks ago associated with pain. He was admitted and given diuretics. The edema resolved but the swelling in his left knee remained. It also feels weak. Over the last few days the right knee has been very painful as well. He admits to a history of gout although he says this feels different. No fevers, chills, sweats.   Past Medical History:  Diagnosis Date  . Allergic rhinitis   . Atrial fibrillation ([place])   . Diabetes mellitus without complication ([place])   . 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 HLD (hyperlipidemia) 02/21/2018  . Hyperlipidemia   . Hypertension   . Prostate cancer ([place])   . T2DM (type 2 diabetes mellitus) ([place]) 02/21/2018    Past Surgical History:  Procedure Laterality Date  . KNEE SURGERY Bilateral    2007 and 1998,   . PROSTATECTOMY  12/15/2006  . SKIN GRAFT     as a child left elbow  . TONSILECTOMY/ADENOIDECTOMY WITH MYRINGOTOMY     as a child    Family History  Problem Relation Age of Onset  . Lung cancer Mother   . Hypertension Mother   . Breast cancer Mother   . Heart attack Father   . Hypertension Sister   . Hypertension Brother   . Hypertension Sister     Social History:  reports that he has never smoked. He has never used smokeless tobacco. He reports that he drinks alcohol. He reports that he does not use drugs.  Allergies: No Known Allergies  Medications: I have reviewed the patient's current medications.  Results for orders placed or performed during the hospital encounter of 03/16/18 (from the past 48 hour(s))  I-stat troponin, ED     Status: None   Collection Time: 03/16/18 10:32 AM  Result Value Ref Range   Troponin i, poc 0.01 0.00 - 0.08 ng/mL   Comment 3            Comment: Due to the release kinetics of cTnI, a  negative result within the first hours of the onset of symptoms does not rule out myocardial infarction with certainty. If myocardial infarction is still suspected, repeat the test at appropriate intervals.   I-Stat CG4 Lactic Acid, ED     Status: Abnormal   Collection Time: 03/16/18 10:34 AM  Result Value Ref Range   Lactic Acid, Venous 2.33 (HH) 0.5 - 1.9 mmol/L   Comment NOTIFIED PHYSICIAN   Glucose, capillary     Status: Abnormal   Collection Time: 03/16/18  6:00 PM  Result Value Ref Range   Glucose-Capillary 119 (H) 65 - 99 mg/dL   Comment 1 Notify RN   Lactic acid, plasma     Status: None   Collection Time: 03/16/18  8:33 PM  Result Value Ref Range   Lactic Acid, Venous 1.1 0.5 - 1.9 mmol/L    Comment: Performed at [facility], [place] [address], [place], [place] [address]  Glucose, capillary     Status: Abnormal   Collection Time: 03/16/18  9:37 PM  Result Value Ref Range   Glucose-Capillary 129 (H) 65 - 99 mg/dL   Comment 1 Notify RN   Lactic acid, plasma     Status: None   Collection Time: 03/17/18  4:47 AM  Result Value Ref Range   Lactic Acid, Venous 0.8 0.5 - 1.9 mmol/L    Comment: Performed at [facility], [place] [address], [place], [place] [address]  CBC     Status: Abnormal   Collection Time: 03/17/18  4:47 AM  Result Value Ref Range   WBC 4.0 4.0 - 10.5 K/uL   RBC 2.84 (L) 4.22 - 5.81 MIL/uL   Hemoglobin 8.4 (L) 13.0 - 17.0 g/dL   HCT 26.3 (L) 39.0 - 52.0 %   MCV 92.6 78.0 - 100.0 fL   MCH 29.6 26.0 - 34.0 pg   MCHC 31.9 30.0 - 36.0 g/dL   RDW 18.9 (H) 11.5 - 15.5 %   Platelets 250 150 - 400 K/uL    Comment: Performed at [facility], [place] [address], [place], [place] [address]  Basic metabolic panel     Status: Abnormal   Collection Time: 03/17/18  4:47 AM  Result Value Ref Range   Sodium 138 135 - 145 mmol/L   Potassium 4.1 3.5 - 5.1 mmol/L   Chloride 93 (L) 101 - 111 mmol/L   CO2 33 (H) 22 - 32  mmol/L   Glucose, Bld 153 (H) 65 - 99 mg/dL   BUN 20 6 - 20 mg/dL   Creatinine, Ser 0.88 0.61 - 1.24 mg/dL   Calcium 8.6 (L) 8.9 - 10.3 mg/dL   GFR calc non Af Amer >60 >60 mL/min   GFR calc Af Amer >60 >60 mL/min    Comment: (NOTE) The eGFR has been calculated using the CKD EPI equation. This calculation has not been validated in all clinical situations. eGFR's persistently <60 mL/min signify possible Chronic Kidney Disease.    Anion gap 12 5 - 15    Comment: Performed at [facility], [place] [address], [place], [place] [address]  MRSA PCR Screening     Status: None   Collection Time: 03/17/18  6:55 AM  Result Value Ref Range   MRSA by PCR NEGATIVE NEGATIVE    Comment:        The GeneXpert MRSA Assay (FDA approved for NASAL specimens only), is one component of a comprehensive MRSA colonization surveillance program. It is not intended to diagnose MRSA infection nor to guide or monitor treatment for MRSA infections. Performed at [facility], [place] [address], [place],  [address]   Glucose, capillary     Status: Abnormal   Collection Time: 03/17/18  7:50 AM  Result Value Ref Range   Glucose-Capillary 171 (H) 65 - 99 mg/dL  Respiratory Panel by PCR     Status: None   Collection Time: 03/17/18  9:12 AM  Result Value Ref Range   Adenovirus NOT DETECTED NOT DETECTED   Coronavirus 229E NOT DETECTED NOT DETECTED   Coronavirus HKU1 NOT DETECTED NOT DETECTED   Coronavirus NL63 NOT DETECTED NOT DETECTED   Coronavirus OC43 NOT DETECTED NOT DETECTED   Metapneumovirus NOT DETECTED NOT DETECTED   Rhinovirus / Enterovirus NOT DETECTED NOT DETECTED   Influenza A NOT DETECTED NOT DETECTED   Influenza B NOT DETECTED NOT DETECTED   Parainfluenza Virus 1 NOT DETECTED NOT DETECTED   Parainfluenza Virus 2 NOT DETECTED NOT DETECTED   Parainfluenza Virus 3 NOT DETECTED NOT DETECTED   Parainfluenza Virus 4 NOT DETECTED NOT DETECTED   Respiratory  Syncytial Virus NOT DETECTED NOT DETECTED   Bordetella pertussis NOT DETECTED NOT DETECTED   Chlamydophila pneumoniae NOT DETECTED NOT DETECTED  Mycoplasma pneumoniae NOT DETECTED NOT DETECTED    Comment: Performed at [place] [address] [place], [place] [address]  Glucose, capillary     Status: Abnormal   Collection Time: 03/17/18 12:12 PM  Result Value Ref Range   Glucose-Capillary 186 (H) 65 - 99 mg/dL  Glucose, capillary     Status: Abnormal   Collection Time: 03/17/18  3:55 PM  Result Value Ref Range   Glucose-Capillary 181 (H) 65 - 99 mg/dL  Glucose, capillary     Status: Abnormal   Collection Time: 03/17/18  9:15 PM  Result Value Ref Range   Glucose-Capillary 207 (H) 65 - 99 mg/dL   Comment 1 N   CBC with Differential/Platelet     Status: Abnormal   Collection Time: 03/18/18  4:43 AM  Result Value Ref Range   WBC 3.7 (L) 4.0 - 10.5 K/uL   RBC 2.82 (L) 4.22 - 5.81 MIL/uL   Hemoglobin 8.2 (L) 13.0 - 17.0 g/dL   HCT 26.4 (L) 39.0 - 52.0 %   MCV 93.6 78.0 - 100.0 fL   MCH 29.1 26.0 - 34.0 pg   MCHC 31.1 30.0 - 36.0 g/dL   RDW 18.3 (H) 11.5 - 15.5 %   Platelets 260 150 - 400 K/uL   Neutrophils Relative % 67 %   Neutro Abs 2.5 1.7 - 7.7 K/uL   Lymphocytes Relative 14 %   Lymphs Abs 0.5 (L) 0.7 - 4.0 K/uL   Monocytes Relative 19 %   Monocytes Absolute 0.7 0.1 - 1.0 K/uL   Eosinophils Relative 0 %   Eosinophils Absolute 0.0 0.0 - 0.7 K/uL   Basophils Relative 0 %   Basophils Absolute 0.0 0.0 - 0.1 K/uL    Comment: Performed at  [facility] [place] [address], [place], [place] [address]  Comprehensive metabolic panel     Status: Abnormal   Collection Time: 03/18/18  4:43 AM  Result Value Ref Range   Sodium 139 135 - 145 mmol/L   Potassium 3.5 3.5 - 5.1 mmol/L   Chloride 91 (L) 101 - 111 mmol/L   CO2 38 (H) 22 - 32 mmol/L   Glucose, Bld 265 (H) 65 - 99 mg/dL   BUN 20 6 - 20 mg/dL   Creatinine, Ser 0.89 0.61 - 1.24 mg/dL   Calcium 8.4  (L) 8.9 - 10.3 mg/dL   Total Protein 5.9 (L) 6.5 - 8.1 g/dL   Albumin 2.4 (L) 3.5 - 5.0 g/dL   AST 20 15 - 41 U/L   ALT 27 17 - 63 U/L   Alkaline Phosphatase 58 38 - 126 U/L   Total Bilirubin 0.7 0.3 - 1.2 mg/dL   GFR calc non Af Amer >60 >60 mL/min   GFR calc Af Amer >60 >60 mL/min    Comment: (NOTE) The eGFR has been calculated using the CKD EPI equation. This calculation has not been validated in all clinical situations. eGFR's persistently <60 mL/min signify possible Chronic Kidney Disease.    Anion gap 10 5 - 15    Comment: Performed at [facility], [place] [address], [place], [place] [address]  Magnesium     Status: None   Collection Time: 03/18/18  4:43 AM  Result Value Ref Range   Magnesium 1.8 1.7 - 2.4 mg/dL    Comment: Performed at [facility], [place] [address] [place], [place] [address]  Phosphorus     Status: None   Collection Time: 03/18/18  4:43 AM  Result Value Ref Range   Phosphorus 4.6 2.5 - 4.6 mg/dL    Comment: Performed at [facility], [place] [address], [place], [place] [address]  Uric acid     Status: Abnormal   Collection Time: 03/18/18  4:43 AM  Result Value Ref Range   Uric Acid, Serum 2.5 (L) 4.4 - 7.6 mg/dL    Comment: Performed at [facility], [place] [address] [place], [place] [address]  Glucose, capillary     Status: Abnormal   Collection Time: 03/18/18  8:10 AM  Result Value Ref Range   Glucose-Capillary 295 (H) 65 - 99 mg/dL    Mr Jeri Cos Wo Contrast  Result Date: 03/16/2018 CLINICAL DATA:  72 year old male with bilateral lower extremity pain, swelling, and weakness for 2 days. EXAM: MRI HEAD WITHOUT AND WITH CONTRAST TECHNIQUE: Multiplanar, multiecho pulse sequences of the brain and surrounding structures were obtained without and with intravenous contrast. CONTRAST:  47m MULTIHANCE GADOBENATE DIMEGLUMINE 529 MG/ML IV SOLN COMPARISON:  Head CT without contrast 03/16/2018.  CT neck 02/25/2018. FINDINGS: Brain: No restricted diffusion to suggest acute infarction. No midline shift, mass effect, evidence of mass lesion, ventriculomegaly, extra-axial collection or acute intracranial hemorrhage. Cervicomedullary junction and pituitary are within normal limits. Cerebral volume is within normal limits for age. Patchy bilateral moderate for age cerebral white matter T2 and FLAIR hyperintensity in a nonspecific configuration. Similar patchy T2 hyperintensity in the pons. No cortical encephalomalacia or chronic cerebral blood products identified. Probable small perivascular space in the right thalamus (normal variant). The other deep gray matter nuclei and the cerebellum appear normal. No abnormal enhancement identified. No dural thickening Vascular: Major intracranial vascular flow voids are preserved. Generalized intracranial artery tortuosity. The major dural venous sinuses are enhancing and appear patent. Skull and upper cervical spine: Negative visible cervical spine. Normal Igarashi marrow signal. Sinuses/Orbits: Normal orbits soft tissues. Paranasal sinuses are clear. Other: Small volume of layering secretions in the visible pharynx. There are bilateral mastoid air cell effusions. Visible internal auditory structures otherwise appear normal. Scalp and face soft tissues appear negative. IMPRESSION: 1.  No acute intracranial abnormality. 2. Moderate for age signal changes in the cerebral white matter and pons, nonspecific but most commonly due to chronic small vessel disease. 3. Retained secretions in the pharynx with bilateral mastoid air cell effusions. Electronically Signed   By: HGenevie AnnM.D.   On: 03/16/2018 16:25   Dg Chest Port 1 View  Result Date: 03/18/2018 CLINICAL DATA:  Shortness of breath EXAM: PORTABLE CHEST 1 VIEW COMPARISON:  Chest x-ray of 03/16/2018 FINDINGS: The lungs again are poorly aerated with persistent opacity at the left lung base some of which represents  atelectasis with apparent elevation of the left hemidiaphragm. Left subpulmonic effusion cannot be excluded. Heart size is stable. Port-A-Cath remains. IMPRESSION: Poor aeration with persistent opacity at the left lung base. Electronically Signed   By: PIvar DrapeM.D.   On: 03/18/2018 08:44   Dg Knee Complete 4 Views Left  Result Date: 03/17/2018 CLINICAL DATA:  Soft tissue swelling EXAM: LEFT KNEE - COMPLETE 4+ VIEW COMPARISON:  None. FINDINGS: Frontal, lateral, and bilateral oblique views were obtained. There is no appreciable fracture or dislocation. There is a large joint effusion. There is moderate narrowing of the lateral compartment and patellofemoral joint with slight narrowing medially. There is spurring in all compartments. There is bony overgrowth along the inferior patella. No erosive changes. IMPRESSION: Moderate osteoarthritic change. Large joint  effusion. 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 No fracture or dislocation. Electronically Signed   By: Lowella Grip III M.D.   On: 03/17/2018 11:42    Review of Systems  Constitutional: Negative for weight loss.  HENT: Positive for sore throat. Negative for ear discharge, ear pain, hearing loss and tinnitus.   Eyes: Negative for blurred vision, double vision, photophobia and pain.  Respiratory: Negative for cough, sputum production and shortness of breath.   Cardiovascular: Negative for chest pain.  Gastrointestinal: Negative for abdominal pain, nausea and vomiting.  Genitourinary: Negative for dysuria, flank pain, frequency and urgency.  Musculoskeletal: Positive for joint pain (right knee). Negative for back pain, falls, myalgias and neck pain.  Neurological: Negative for dizziness, tingling, sensory change, focal weakness, loss of consciousness and headaches.  Endo/Heme/Allergies: Does not bruise/bleed easily.  Psychiatric/Behavioral: Negative for depression, memory loss and substance abuse. The  patient is not nervous/anxious.    Blood pressure 126/66, pulse (!) 110, temperature 99.6 F (37.6 C), temperature source Axillary, resp. rate 18, height 5' 9"  (1.753 m), weight 86.9 kg (191 lb 9.3 oz), SpO2 96 %. Physical Exam  Constitutional: He appears well-developed and well-nourished. No distress.  HENT:  Head: Normocephalic and atraumatic.  Eyes: Conjunctivae are normal. Right eye exhibits no discharge. Left eye exhibits no discharge. No scleral icterus.  Neck: Normal range of motion.  Cardiovascular: Normal rate and regular rhythm.  Respiratory: Effort normal. No respiratory distress.  Musculoskeletal:  RLE No traumatic wounds, ecchymosis, or rash  Moderate pain with AROM/PROM, ~180-145  No knee or ankle effusion  Knee stable to varus/ valgus and anterior/posterior stress, could not SLR  Sens DPN, SPN, TN intact  Motor EHL, ext, flex, evers 5/5  DP 1+, PT 1+, No significant edema  LLE No traumatic wounds, ecchymosis, or rash  Mod pain with AROM/PROM, ~180-130  Mod effusion  Knee stable to varus/ valgus and anterior/posterior stress  Sens DPN, SPN, TN intact  Motor EHL, ext, flex, evers 5/5  DP 1+, PT 1+, No significant edema  Neurological: He is alert.  Skin: Skin is warm and dry. He is not diaphoretic.  Psychiatric: He has a normal mood and affect. His behavior is normal.    Assessment/Plan: Bilateral knee pain -- Aspirated both knees, clear fluid obtained so septic arthritis unlikely. Injected Marcaine and steroid. Will send fluid for cell count and cultures. Favor gout or reactive arthritis at this point. No restrictions on motion or weight bearing.  Squamous cell ca base of tongue A fib, DM, hyperlipidemia, HTN    Lisette Abu, PA-C Orthopedic Surgery [phone] 03/18/2018, 10:31 AM

## 2018-03-18 NOTE — Procedures (Signed)
Procedure: Bilateral knee aspiration and injection  Indication: Bilateral knee effusion(s)  Surgeon: Silvestre Gunner, PA-C  Assist: None  Anesthesia: None  EBL: None  Complications: None  Findings: After risks/benefits explained patient desires to undergo procedure. Consent obtained and time out performed. Both knees were sterilely prepped and aspirated. Clear fluid obtained from each, 71ml from left and 31ml from right. 97ml 0.5% Marcaine and 40mg  depomedrol instilled into each. Pt tolerated procedure well.    Lisette Abu, PA-C Orthopedic Surgery [phone]

## 2018-03-18 NOTE — Progress Notes (Signed)
Oncology Nurse Navigator Documentation  Visited Edgar Her[facility] to check on his well-being, provide encouragement and support.  His wife was bedside. His wife indicated he has been better since admission though still unable to ambulate.  She stated plan is for him to go to rehab when [place]. Will continue to monitor during this admission.  Gayleen Orem, RN, BSN Head & Neck Oncology Nurse [place] [facility] [place] [phone]

## 2018-03-18 NOTE — Progress Notes (Signed)
Inpatient Diabetes Program Recommendations  AACE/ADA: New Consensus Statement on Inpatient Glycemic Control (2015)  Target Ranges:  Prepandial:   less than 140 mg/dL      Peak postprandial:   less than 180 mg/dL (1-2 hours)      Critically ill patients:  140 - 180 mg/dL   Results for Edgar Herrera, Edgar Herrera (MRN [id number]) as of 03/18/2018 10:14  Ref. Range 03/17/2018 07:50 03/17/2018 12:12 03/17/2018 15:55 03/17/2018 21:15 03/18/2018 08:10  Glucose-Capillary Latest Ref Range: 65 - 99 mg/dL 171 (H) 186 (H) 181 (H) 207 (H) 295 (H)   Review of Glycemic Control  Diabetes history: DM 2 Outpatient Diabetes medications: Glipizide 5 mg qam, 2.5 mg qpm, Metformin 1000 mg BID Current orders for Inpatient glycemic control: Novolog Moderate Correction 0-15 units tid + Novolog hs scale 0-5 units  Inpatient Diabetes Program Recommendations:    Glucose trends increased in ht eupper 200 range after start of tube feeds. Please consider CBGs Q4 hours and Novolog 3 units Q4 hours Tube Feed Coverage (do not give if tube feeds are stopped or held for any reason).  Thanks,  Tama Headings RN, MSN, BC-ADM, [facility] Inpatient Diabetes Coordinator Team Pager [phone] (8a-5p)

## 2018-03-18 NOTE — Progress Notes (Signed)
Initial Nutrition Assessment  INTERVENTION:   -Will change Jevity 1.2 to Jevity 1.5. Continue at 40 ml/hr and advance after 12 hours to goal rate of 50 ml/hr. -Will order 30 ml Prostat TID. -Will order Juven Fruit Punch BID via PEG, each serving provides 96kcal and 2.5g of protein (amino acids glutamine and arginine) -This will provide 2292 kcal, 126g protein and 912 ml H2O.  NUTRITION DIAGNOSIS:   Increased nutrient needs related to cancer and cancer related treatments as evidenced by estimated needs.  GOAL:   Patient will meet greater than or equal to 90% of their needs  MONITOR:   Labs, Weight trends, TF tolerance, Skin, I & O's  REASON FOR ASSESSMENT:   Consult Enteral/tube feeding initiation and management, Assessment of nutrition requirement/status  ASSESSMENT:   72 y.o. male with medical history significant of paroxysmal A. fib, diabetes type 2, hyperlipidemia, hypertension, squamous cell carcinoma base of tongue, status post PEG tube placement who presents to the emergency department with complaints of inability to walk, increased weakness mainly in the left side and severe bilateral lower extremity edema.  Patient readmitted for LE edema and knee swelling. Pt s/p bilateral knee aspiration and injection. Pt with PEG and uses Isosource 1.5 at home for TF. Equivalent on our formulary is Jevity 1.5, will switch from 1.2 to 1.5 formula.  Pt with stage II buttocks ulcer, will add Juven to aid in healing.   Per chart review, pt has lost 11 lb since 5/28 (last admission), 5% wt loss x 2 weeks, significant for time frame.    Medications: IV Lasix every 12 hours, Vitamin B-12 tablet BID Labs reviewed: CBGs: 207-295 -being addressed by DM coordinator Mg/Phos WNL   NUTRITION - FOCUSED PHYSICAL EXAM:    Most Recent Value  Orbital Region  No depletion  Upper Arm Region  Mild depletion  Thoracic and Lumbar Region  Unable to assess  Buccal Region  No depletion  Temple Region   No depletion  Clavicle Cabriales Region  Mild depletion  Clavicle and Acromion Sortor Region  Mild depletion  Scapular Strahm Region  No depletion  Dorsal Hand  No depletion  Patellar Region  Unable to assess  Anterior Thigh Region  Unable to assess  Posterior Calf Region  Unable to assess  Edema (RD Assessment)  Mild       Diet Order:   Diet Order    None      EDUCATION NEEDS:   Education needs have been addressed  Skin:  Skin Integrity Issues:: Stage II Stage II: buttocks  Last BM:  6/7  Height:   Ht Readings from Last 1 Encounters:  03/16/18 5\' 9"  (1.753 m)    Weight:   Wt Readings from Last 1 Encounters:  03/16/18 191 lb 9.3 oz (86.9 kg)    Ideal Body Weight:  72.72 kg  BMI:  Body mass index is 28.29 kg/m.  Estimated Nutritional Needs:   Kcal:  4128-7867  Protein:  115-130g  Fluid:  2.4L/day   Clayton Bibles, MS, RD, LDN [place] Dietitian Pager: [phone] After Hours Pager: [phone]

## 2018-03-18 NOTE — Progress Notes (Signed)
*  Preliminary Results* Bilateral lower extremity venous duplex completed. Bilateral lower extremities are negative for deep vein thrombosis. There is no evidence of Baker's cyst bilaterally.  03/18/2018 9:08 AM Maudry Mayhew, BS, RVT, RDCS, RDMS

## 2018-03-18 NOTE — Progress Notes (Signed)
[facility]  Patient Status: Active (receiving services up to time of hospitalization)  [facility] is providing the following services: PT  If patient discharges after hours, please call [phone].   Edwinna Areola 03/18/2018, 9:27 AM

## 2018-03-19 ENCOUNTER — Inpatient Hospital Stay (HOSPITAL_COMMUNITY): Payer: Medicare Other

## 2018-03-19 LAB — PHOSPHORUS: PHOSPHORUS: 2.8 mg/dL (ref 2.5–4.6)

## 2018-03-19 LAB — COMPREHENSIVE METABOLIC PANEL
ALT: 36 U/L (ref 17–63)
ANION GAP: 12 (ref 5–15)
AST: 29 U/L (ref 15–41)
Albumin: 2.3 g/dL — ABNORMAL LOW (ref 3.5–5.0)
Alkaline Phosphatase: 67 U/L (ref 38–126)
BUN: 37 mg/dL — ABNORMAL HIGH (ref 6–20)
CO2: 38 mmol/L — AB (ref 22–32)
CREATININE: 0.87 mg/dL (ref 0.61–1.24)
Calcium: 8.9 mg/dL (ref 8.9–10.3)
Chloride: 88 mmol/L — ABNORMAL LOW (ref 101–111)
GFR calc non Af Amer: >60 mL/min (ref >60)
Glucose, Bld: 328 mg/dL — ABNORMAL HIGH (ref 65–99)
POTASSIUM: 3.2 mmol/L — AB (ref 3.5–5.1)
Sodium: 138 mmol/L (ref 135–145)
Total Bilirubin: 0.4 mg/dL (ref 0.3–1.2)
Total Protein: 6.3 g/dL — ABNORMAL LOW (ref 6.5–8.1)

## 2018-03-19 LAB — GLUCOSE, CAPILLARY
GLUCOSE-CAPILLARY: 268 mg/dL — AB (ref 65–99)
GLUCOSE-CAPILLARY: 279 mg/dL — AB (ref 65–99)
Glucose-Capillary: 267 mg/dL — ABNORMAL HIGH (ref 65–99)
Glucose-Capillary: 268 mg/dL — ABNORMAL HIGH (ref 65–99)
Glucose-Capillary: 295 mg/dL — ABNORMAL HIGH (ref 65–99)
Glucose-Capillary: 366 mg/dL — ABNORMAL HIGH (ref 65–99)

## 2018-03-19 LAB — CBC WITH DIFFERENTIAL/PLATELET
Basophils Absolute: 0 10*3/uL (ref 0.0–0.1)
Basophils Relative: 0 %
Eosinophils Absolute: 0 10*3/uL (ref 0.0–0.7)
Eosinophils Relative: 0 %
HCT: 26.8 % — ABNORMAL LOW (ref 39.0–52.0)
Hemoglobin: 8.4 g/dL — ABNORMAL LOW (ref 13.0–17.0)
LYMPHS ABS: 0.2 10*3/uL — AB (ref 0.7–4.0)
Lymphocytes Relative: 3 %
MCH: 29.1 pg (ref 26.0–34.0)
MCHC: 31.3 g/dL (ref 30.0–36.0)
MCV: 92.7 fL (ref 78.0–100.0)
Monocytes Absolute: 0.6 10*3/uL (ref 0.1–1.0)
Monocytes Relative: 12 %
Neutro Abs: 4 10*3/uL (ref 1.7–7.7)
Neutrophils Relative %: 85 %
PLATELETS: 277 10*3/uL (ref 150–400)
RBC: 2.89 MIL/uL — AB (ref 4.22–5.81)
RDW: 17.2 % — ABNORMAL HIGH (ref 11.5–15.5)
WBC: 4.7 10*3/uL (ref 4.0–10.5)

## 2018-03-19 LAB — MAGNESIUM: Magnesium: 2.1 mg/dL (ref 1.7–2.4)

## 2018-03-19 MED ORDER — INSULIN ASPART 100 UNIT/ML ~~LOC~~ SOLN
3.0000 [IU] | SUBCUTANEOUS | Status: DC
  Administered 2018-03-19 – 2018-03-22 (×15): 3 [IU] via SUBCUTANEOUS

## 2018-03-19 MED ORDER — POTASSIUM CHLORIDE 20 MEQ/15ML (10%) PO SOLN
40.0000 meq | Freq: Two times a day (BID) | ORAL | Status: AC
  Administered 2018-03-19 (×2): 40 meq
  Filled 2018-03-19 (×2): qty 30

## 2018-03-19 NOTE — Progress Notes (Signed)
Pharmacy Antibiotic Note  Edgar Herrera is a 72 y.o. male admitted on 03/16/2018 with suspected aspiration pneumonia.  Pharmacy has been consulted for Zosyn dosing.  Plan:  Continue Zosyn 3.375g IV Q8H infused over 4hrs.   Follow up renal fxn, culture results, and clinical course.  F/u ability to de-escalate antibiotics.   Height: 5\' 9"  (175.3 cm) Weight: 191 lb 9.3 oz (86.9 kg) IBW/kg (Calculated) : 70.7  Temp (24hrs), Avg:98.6 F (37 C), Min:98.1 F (36.7 C), Max:99.2 F (37.3 C)  Recent Labs  Lab 03/16/18 1001 03/16/18 1034 03/16/18 2033 03/17/18 0447 03/18/18 0443 03/19/18 0412  WBC 4.0  --   --  4.0 3.7* 4.7  CREATININE 0.77  --   --  0.88 0.89 0.87  LATICACIDVEN  --  2.33* 1.1 0.8  --   --     Estimated Creatinine Clearance: 82.6 mL/min (by C-G formula based on SCr of 0.87 mg/dL).    No Known Allergies  Antimicrobials this admission: 6/8 Zosyn >>   Dose adjustments this admission:   Microbiology results: 6/9 MRSA PCR: negative 6/9 resp panel: negative   Thank you for allowing pharmacy to be a part of this patient's care.   Royetta Asal, PharmD, BCPS Pager [phone] 03/19/2018 10:06 AM

## 2018-03-19 NOTE — Progress Notes (Signed)
Radiation Oncology         ([phone]) [phone] ________________________________  Name: Shakeel Disney MRN: [id number]  Date: 03/16/2018  DOB: 02/01/1946  Inpatient Follow-Up Visit Note  CC: [place]  No ref. 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 I visited with them after meeting his wife in the cafeteria yesterday and realizing he had been admitted again for LE edema.  They report that the working diagnosis is now gout and he is improving.  They are pleased as he has been having a good day.  He expects to go to rehab after he is discharged to work on his mobility and lower extremity strength.  He feels that he is healing well in the head and neck region.  Skin is healing and thick saliva has decreased.  He denies any pain in his throat      ALLERGIES:  has No Known Allergies.  Meds: Current Facility-Administered Medications  Medication Dose Route Frequency Provider Last Rate Last Dose  . allopurinol (ZYLOPRIM) tablet 100 mg  100 mg Oral Daily Shelly Coss, MD   100 mg at 03/19/18 0941  . apixaban (ELIQUIS) tablet 5 mg  5 mg Oral Q12H Shelly Coss, MD   5 mg at 03/19/18 0601  . chlorhexidine (PERIDEX) 0.12 % solution 15 mL  15 mL Mouth Rinse BID Raiford Noble [place], DO   15 mL at 03/19/18 0941  . Chlorhexidine Gluconate Cloth 2 % PADS 6 each  6 each Topical Daily Raiford Noble [place], [place]   6 each at 03/19/18 1106  . diclofenac sodium (VOLTAREN) 1 % transdermal gel 2 g  2 g Topical QID Sheikh, Omair [place], DO   2 g at 03/18/18 2136  . diltiazem (CARDIZEM) 100 mg in dextrose 5% 154mL (1 mg/mL) infusion  5-15 mg/hr Intravenous Continuous Shelly Coss, MD 7.5 mL/hr at  03/19/18 0942 7.5 mg/hr at 03/19/18 0942  . feeding supplement (JEVITY 1.5 CAL/FIBER) liquid 1,000 mL  1,000 mL Per Tube Continuous Raiford Noble Latif, DO 40 mL/hr at 03/19/18 1242 1,000 mL at 03/19/18 1242  . feeding supplement (PRO-STAT SUGAR FREE 64) liquid 30 mL  30 mL Per Tube TID Raiford Noble Latif, DO   30 mL at 03/19/18 1444  . guaiFENesin-dextromethorphan (ROBITUSSIN DM) 100-10 MG/5ML syrup 10 mL  10 mL Oral Q4H PRN Shelly Coss, MD   10 mL at 03/18/18 2057  . insulin aspart (novoLOG) injection 0-15 Units  0-15 Units Subcutaneous Q4H Raiford Noble [place], [place]   8 Units at 03/19/18 1551  . ipratropium (ATROVENT) nebulizer solution 0.5 mg  0.5 mg Nebulization TID Raiford Noble Latif, DO   0.5 mg at 03/19/18 1400  . levalbuterol (XOPENEX) nebulizer solution 0.63 mg  0.63 mg Nebulization TID Raiford Noble Latif, DO   0.63 mg at 03/19/18 1401  . levalbuterol (XOPENEX) nebulizer solution 0.63 mg  0.63 mg Nebulization Q4H PRN Raiford Noble Latif, DO      . MEDLINE mouth rinse  15 mL Mouth Rinse q12n4p Raiford Noble Latif, DO   15 mL at 03/19/18 1551  . metoprolol tartrate (LOPRESSOR) tablet 25 mg  25 mg  Oral BID Shelly Coss, MD   25 mg at 03/19/18 0942  . morphine 2 MG/ML injection 1 mg  1 mg Intravenous Q4H PRN Raiford Noble [place], DO   1 mg at 03/18/18 4098  . nutrition supplement (JUVEN) (JUVEN) powder packet 1 packet  1 packet Per Tube BID BM Raiford Noble [place], DO   1 packet at 03/19/18 1444  . piperacillin-tazobactam (ZOSYN) IVPB 3.375 g  3.375 g Intravenous Q8H Shade, Christine E, RPH 12.5 mL/hr at 03/19/18 1444 3.375 g at 03/19/18 1444  . polyvinyl alcohol (LIQUIFILM TEARS) 1.4 % ophthalmic solution 1 drop  1 drop Both Eyes Daily Lenis Noon, RPH   1 drop at 03/19/18 1191  . potassium chloride 20 MEQ/15ML (10%) solution 40 mEq  40 mEq Per Tube BID Raiford Noble [place], DO   40 mEq at 03/19/18 0940  . pravastatin (PRAVACHOL) tablet 40 mg  40 mg Oral QPM Adhikari, Amrit, MD   40 mg  at 03/18/18 1700  . scopolamine (TRANSDERM-SCOP) 1 MG/3DAYS 1.5 mg  1 patch Transdermal Q72H Shelly Coss, MD   1.5 mg at 03/17/18 0042  . sodium chloride flush (NS) 0.9 % injection 10-40 mL  10-40 mL Intracatheter Q12H Sheikh, Georgina Quint [place], DO   10 mL at 03/18/18 2136  . sodium chloride flush (NS) 0.9 % injection 10-40 mL  10-40 mL Intracatheter PRN Sheikh, Omair Latif, DO      . vitamin B-12 (CYANOCOBALAMIN) tablet 500 mcg  500 mcg Oral BID Shelly Coss, MD   500 mcg at 03/19/18 4782    Physical Findings: The patient is in no acute distress. Patient is alert and oriented. Wt Readings from Last 3 Encounters:  03/19/18 194 lb 0.1 oz (88 kg)  03/08/18 198 lb 3.1 oz (89.9 kg)  02/28/18 211 lb 10.3 oz (96 kg)    height is 5\' 9"  (1.753 m) and weight is 194 lb 0.1 oz (88 kg). His oral temperature is 97.7 F (36.5 C). His blood pressure is 106/55 (abnormal) and his pulse is 74. His respiration is 15 and oxygen saturation is 94%. .  General: Alert and oriented, in no acute distress HEENT: Head is normocephalic.  No oral lesions.  No thrush.  Mucous membranes are moist  Neck is notable for excellent healing of the skin.  No palpable masses Skin: Skin in treatment fields shows satisfactory healing  Lymphatics: see Neck Exam Psychiatric: Judgment and insight are intact. Affect is appropriate.   Lab Findings: Lab Results  Component Value Date   WBC 4.7 03/19/2018   HGB 8.4 (L) 03/19/2018   HCT 26.8 (L) 03/19/2018   MCV 92.7 03/19/2018   PLT 277 03/19/2018    Lab Results  Component Value Date   TSH 0.557 02/01/2018    Radiographic Findings: Dg Chest 2 View  Result Date: 03/16/2018 CLINICAL DATA:  Cough for 2 months.  History of throat carcinoma EXAM: CHEST - 2 VIEW COMPARISON:  Mar 08, 2018 FINDINGS: There is a small left pleural effusion with left base atelectatic change. Right lung is clear. Heart is upper normal in size with pulmonary vascularity normal. No adenopathy.  Port-A-Cath tip is cavoatrial junction. No pneumothorax. There is degenerative change in the thoracic spine. IMPRESSION: Small left pleural effusion with left base atelectasis. Early pneumonia in the left base cannot be excluded. Lungs elsewhere clear. Stable cardiac silhouette. Port-A-Cath tip at cavoatrial junction. Electronically Signed   By: Lowella Grip III M.D.   On: 03/16/2018 10:32   Ct  Head Wo Contrast  Result Date: 03/16/2018 CLINICAL DATA:  Lower extremity weakness. History of throat carcinoma EXAM: CT HEAD WITHOUT CONTRAST TECHNIQUE: Contiguous axial images were obtained from the base of the skull through the vertex without intravenous contrast. COMPARISON:  None. FINDINGS: Brain: There is moderate diffuse atrophy. There is no intracranial mass, hemorrhage, extra-axial fluid collection, or midline shift. There is patchy small vessel disease throughout the centra semiovale bilaterally. Elsewhere gray-white compartments appear normal. No evident acute infarct. Vascular: No hyperdense vessels. There is calcification in each carotid siphon as well as in the distal vertebral arteries. Skull: Bony calvarium appears intact. Sinuses/Orbits: There is a small retention cyst in the anterior left maxillary antrum. There is mucosal thickening in several ethmoid air cells. There is opacification in a somewhat hypoplastic left frontal sinus. Orbits appear symmetric bilaterally. Other: There is opacification multiple mastoid air cells bilaterally. IMPRESSION: Atrophy with patchy periventricular small vessel disease. No evident acute infarct. No mass or hemorrhage. There are foci of arterial vascular calcification. Areas of paranasal sinus and mastoid disease noted. Electronically Signed   By: Lowella Grip III M.D.   On: 03/16/2018 10:02   Ct Soft Tissue Neck W Contrast  Result Date: 02/25/2018 CLINICAL DATA:  Head neck squamous cell carcinoma. Difficulty swallowing. EXAM: CT NECK WITH CONTRAST  TECHNIQUE: Multidetector CT imaging of the neck was performed using the standard protocol following the bolus administration of intravenous contrast. CONTRAST:  26mL OMNIPAQUE IOHEXOL 300 MG/ML  SOLN COMPARISON:  CT neck 11/29/2017 FINDINGS: PHARYNX AND LARYNX: --Nasopharynx: Fossae of Rosenmuller are clear. Normal adenoid tonsils for age. --Oral cavity and oropharynx: The palatine and lingual tonsils are normal. The visible oral cavity and floor of mouth are normal. --Hypopharynx: Previously demonstrated mass within the left vallecula and encroaching on the left parapharyngeal space is no longer present. There is no residual mass visualized. --Larynx: Normal epiglottis and pre-epiglottic space. Normal aryepiglottic and vocal folds. --Retropharyngeal space: No abscess, effusion or lymphadenopathy. SALIVARY GLANDS: --Parotid: No mass lesion or inflammation. No sialolithiasis or ductal dilatation. --Submandibular: Symmetric without inflammation. No sialolithiasis or ductal dilatation. --Sublingual: Normal. No ranula or other visible lesion of the base of tongue and floor of mouth. THYROID: Normal. LYMPH NODES: Hypodense left level 2 A node measures 9 mm. A slightly lower left level 2A node measures 10 mm. These nodes have decreased in size. Unchanged appearance of subcentimeter left level 3 nodes. No right-sided adenopathy. VASCULAR: Calcific atherosclerosis of both carotid bifurcations without high-grade stenosis. LIMITED INTRACRANIAL: Normal. VISUALIZED ORBITS: Normal. MASTOIDS AND VISUALIZED PARANASAL SINUSES: No fluid levels or advanced mucosal thickening. No mastoid effusion. SKELETON: No bony spinal canal stenosis. No lytic or blastic lesions. UPPER CHEST: Clear. OTHER: None. IMPRESSION: 1. No residual pharyngeal mass is visible. No specific finding to account for the reported dysphagia. 2. Decreased size of left level 2 A cervical lymph nodes, likely indicating response to therapy. Electronically Signed   By:  Ulyses Jarred M.D.   On: 02/25/2018 17:35   Ct Angio Chest Pe W And/or Wo Contrast  Result Date: 03/03/2018 CLINICAL DATA:  Hypoxia. EXAM: CT ANGIOGRAPHY CHEST WITH CONTRAST TECHNIQUE: Multidetector CT imaging of the chest was performed using the standard protocol during bolus administration of intravenous contrast. Multiplanar CT image reconstructions and MIPs were obtained to evaluate the vascular anatomy. CONTRAST:  22mL ISOVUE-370 IOPAMIDOL (ISOVUE-370) INJECTION 76% COMPARISON:  Radiograph of same day. FINDINGS: Cardiovascular: Satisfactory opacification of the pulmonary arteries to the segmental level. No evidence of pulmonary embolism. Normal  heart size. No pericardial effusion. Mediastinum/Nodes: No enlarged mediastinal, hilar, or axillary lymph nodes. Thyroid gland, trachea, and esophagus demonstrate no significant findings. Lungs/Pleura: No pneumothorax or pleural effusion is noted. Mild left basilar atelectasis is noted. Mild atelectasis or possibly pneumonia is noted in superior segment of right lower lobe. Mild right upper lobe subsegmental atelectasis or inflammation is noted. Upper Abdomen: Large solitary gallstone is noted without inflammation. Gastrostomy tube is in grossly good position. Musculoskeletal: No chest wall abnormality. No acute or significant osseous findings. Review of the MIP images confirms the above findings. IMPRESSION: No definite evidence of pulmonary embolus. Atelectasis or pneumonia is noted in superior segment of right lower lobe. Mild right upper lobe subsegmental atelectasis or pneumonia is noted. Mild left basilar subsegmental atelectasis is noted. Large solitary gallstone is noted. Gastrostomy tube in grossly good position. Electronically Signed   By: Marijo Conception, M.D.   On: 03/03/2018 09:17   Mr Jeri Cos LN Contrast  Result Date: 03/16/2018 CLINICAL DATA:  72 year old male with bilateral lower extremity pain, swelling, and weakness for 2 days. EXAM: MRI HEAD  WITHOUT AND WITH CONTRAST TECHNIQUE: Multiplanar, multiecho pulse sequences of the brain and surrounding structures were obtained without and with intravenous contrast. CONTRAST:  12mL MULTIHANCE GADOBENATE DIMEGLUMINE 529 MG/ML IV SOLN COMPARISON:  Head CT without contrast 03/16/2018. CT neck 02/25/2018. FINDINGS: Brain: No restricted diffusion to suggest acute infarction. No midline shift, mass effect, evidence of mass lesion, ventriculomegaly, extra-axial collection or acute intracranial hemorrhage. Cervicomedullary junction and pituitary are within normal limits. Cerebral volume is within normal limits for age. Patchy bilateral moderate for age cerebral white matter T2 and FLAIR hyperintensity in a nonspecific configuration. Similar patchy T2 hyperintensity in the pons. No cortical encephalomalacia or chronic cerebral blood products identified. Probable small perivascular space in the right thalamus (normal variant). The other deep gray matter nuclei and the cerebellum appear normal. No abnormal enhancement identified. No dural thickening Vascular: Major intracranial vascular flow voids are preserved. Generalized intracranial artery tortuosity. The major dural venous sinuses are enhancing and appear patent. Skull and upper cervical spine: Negative visible cervical spine. Normal Meany marrow signal. Sinuses/Orbits: Normal orbits soft tissues. Paranasal sinuses are clear. Other: Small volume of layering secretions in the visible pharynx. There are bilateral mastoid air cell effusions. Visible internal auditory structures otherwise appear normal. Scalp and face soft tissues appear negative. IMPRESSION: 1.  No acute intracranial abnormality. 2. Moderate for age signal changes in the cerebral white matter and pons, nonspecific but most commonly due to chronic small vessel disease. 3. Retained secretions in the pharynx with bilateral mastoid air cell effusions. Electronically Signed   By: Genevie Ann M.D.   On: 03/16/2018  16:25   Dg Chest Port 1 View  Result Date: 03/19/2018 CLINICAL DATA:  Shortness of Breath EXAM: PORTABLE CHEST 1 VIEW COMPARISON:  03/18/2018 FINDINGS: Cardiac shadow is stable. Left-sided chest wall port is noted. The overall inspiratory effort remains poor with some left basilar atelectatic changes no new focal abnormality is noted. IMPRESSION: Stable appearance of the chest. Electronically Signed   By: Inez Catalina M.D.   On: 03/19/2018 08:50   Dg Chest Port 1 View  Result Date: 03/18/2018 CLINICAL DATA:  Shortness of breath EXAM: PORTABLE CHEST 1 VIEW COMPARISON:  Chest x-ray of 03/16/2018 FINDINGS: The lungs again are poorly aerated with persistent opacity at the left lung base some of which represents atelectasis with apparent elevation of the left hemidiaphragm. Left subpulmonic effusion cannot be excluded.  Heart size is stable. Port-A-Cath remains. IMPRESSION: Poor aeration with persistent opacity at the left lung base. Electronically Signed   By: Ivar Drape M.D.   On: 03/18/2018 08:44   Dg Chest Port 1 View  Result Date: 03/08/2018 CLINICAL DATA:  Dyspnea. EXAM: PORTABLE CHEST 1 VIEW COMPARISON:  One-view chest x-ray 03/04/2018 FINDINGS: The heart size is exaggerated by low lung volumes. A left subclavian Port-A-Cath is in place. Lung volumes are low. Progressive left lower lobe airspace disease is present. There is no edema or effusion. IMPRESSION: 1. Developing lingular or left lower lobe airspace disease is concerning for infection or aspiration. 2. Low lung volumes. 3. Left subclavian Port-A-Cath. Electronically Signed   By: San Morelle M.D.   On: 03/08/2018 07:27   Dg Chest Port 1 View  Result Date: 03/04/2018 CLINICAL DATA:  Shortness of breath EXAM: PORTABLE CHEST 1 VIEW COMPARISON:  03/03/2018 FINDINGS: Shallow inspiration. Cardiac enlargement. No vascular congestion, edema, or consolidation. No blunting of costophrenic angles. No pneumothorax. Power port type central  venous catheter with tip over the cavoatrial junction region. Tortuous aorta. IMPRESSION: Shallow inspiration. Cardiac enlargement. No active pulmonary disease. Electronically Signed   By: Lucienne Capers M.D.   On: 03/04/2018 03:25   Dg Chest Port 1 View  Result Date: 03/03/2018 CLINICAL DATA:  Fevers. EXAM: PORTABLE CHEST 1 VIEW COMPARISON:  02/21/2018 FINDINGS: Left chest wall port a catheter is noted with tip in the right atrium. Unchanged. There is cardiac enlargement. No pleural effusion or edema. No airspace opacities. IMPRESSION: 1. No acute cardiopulmonary abnormalities. Electronically Signed   By: Kerby Moors M.D.   On: 03/03/2018 07:49   Dg Chest Portable 1 View  Result Date: 02/21/2018 CLINICAL DATA:  Throat cancer.  Coughing up red phlegm. EXAM: PORTABLE CHEST 1 VIEW COMPARISON:  Outside PET-CT 12/24/2017. FINDINGS: Left subclavian PowerPort catheter noted with tip in the right atrium. Cardiomegaly with normal pulmonary vascularity. No focal infiltrate. No pleural effusion pneumothorax. Mild elevation left hemidiaphragm. No acute bony abnormality. Mediastinum and hilar structures are normal. IMPRESSION: One PowerPort catheter noted with tip over the right atrium. 2.  Cardiomegaly.  No pulmonary venous congestion. 3.  No acute pulmonary disease. Electronically Signed   By: Marcello Moores  Register   On: 02/21/2018 07:41   Dg Knee Complete 4 Views Left  Result Date: 03/17/2018 CLINICAL DATA:  Soft tissue swelling EXAM: LEFT KNEE - COMPLETE 4+ VIEW COMPARISON:  None. FINDINGS: Frontal, lateral, and bilateral oblique views were obtained. There is no appreciable fracture or dislocation. There is a large joint effusion. There is moderate narrowing of the lateral compartment and patellofemoral joint with slight narrowing medially. There is spurring in all compartments. There is bony overgrowth along the inferior patella. No erosive changes. IMPRESSION: Moderate osteoarthritic change. Large joint  effusion. 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 No fracture or dislocation. Electronically Signed   By: Lowella Grip III M.D.   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  It will be a burden for them to return to clinic in a few days for the scheduled outpatient follow-up that he had planned upon.  I told the patient and his wife to focus on his rehabilitation and we will move his follow-up back by 3 weeks.  I can certainly see him sooner if needed.  They are pleased with that plan  2) Nutritional Status: Continue using PEG tube as  needed.  Low albumin noted. Nutritionists are involved  3) Swallowing: Patient has been involved with SLP.  Continue swallowing exercises to decrease risk of long-term dysphagia  4) Dental: Encouraged to continue regular followup with dentistry, and dental hygiene including fluoride rinses.   5) Thyroid function: Within normal limits, check annually Lab Results  Component Value Date   TSH 0.557 02/01/2018   _____________________________________   [name], MD

## 2018-03-19 NOTE — Progress Notes (Signed)
Results for Abrams, THURSTON (MRN [id number]) as of 03/19/2018 11:50  Ref. Range 03/18/2018 21:26 03/18/2018 23:46 03/19/2018 04:20 03/19/2018 07:53 03/19/2018 11:42  Glucose-Capillary Latest Ref Range: 65 - 99 mg/dL 393 (H) 391 (H) 267 (H) 268 (H) 268 (H)  CBGs continue to be elevated. Recommend adding Novolog 3 units every 4 hours for tube feed coverage if blood sugars continue to be greater than 180 mg/dl.  Harvel Ricks RN BSN CDE Diabetes Coordinator Pager: [phone]  8am-5pm

## 2018-03-19 NOTE — Progress Notes (Signed)
PROGRESS NOTE    Edgar Herrera  MWN:[id number] DOB: June 25, 1946 DOA: 03/16/2018 PCP: [facility]   Brief Narrative:  Edgar Herrera is a 71 y.o. male with medical history significant of paroxysmal A. fib, diabetes type 2, hyperlipidemia, hypertension, squamous cell carcinoma base of tongue, status post PEG tube placement who presents to the emergency department with complaints of inability to walk, increased weakness mainly in the left side and severe bilateral lower extremity edema.  Patient was just discharged on 03/08/2018 at that time he was managed for acute respiratory failure due to aspiration pneumonia.  Patient's cancer was diagnosed about 2 months ago.  He follows with oncology and is currently on radiation treatment and chemotherapy.  He was on systemic treatment with carboplatin and paclitaxel.  Before the cancer diagnosis, he was at independent and physically active and used to run several miles a week.   Currently he has been severely deconditioned.  Since last week, patient has been unable to walk.  He has noticed severe bilateral lower extremity edema.  He has noticed that he is more weak on his left side.  He has been trying to ambulate with the help of walker.  In addition, patient also has bothering cough and has noticed mild grade fever and chills at home.  Patient seen and examined the bedside in the emergency department.  He was hemodynamically stable, alert and oriented.  Admitted for weakness and found to have significant swelling of Left knee. Orthopedics Consulted for Joint Aspiration and both knees were aspirated and injected today. Has been intermittently in A Fib with RVR so remains in SDU on and off Cardizem gtt and is attempting to be weaned off.   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   -Patient is unable to walk since last 1 week which is a new finding and ? Related to Knees.   -Needed to rule out brain metastasis and needed to rule out a stroke so MRI of the brain was ordered.   -CT head done in the emergency department did not show any acute intracranial abnormalities. -MRI of Brain showed No acute Intracranial Abnormalities -Patient denies any back pain.   -PT Evaluation recommending SNF. -Social Worker consulted for SNF placement     Left Knee Swelling and Pain; ? Gout vs. Osteoarthritis -Obtained X-Ray of Left Knee -X-Ray showed Moderate Osteoarthritic change and a Large Joint Effusion; There was bony overgrowth along the inferior patella and no fracture or dislocation noted -Will order Knee Aspiration and send Fluid for analysis  -Is on Allopurinol 100 mg po Daily but will check Uric Acid Level in AM  -Discussed with Dr. Lennette Bihari Haddix of Orthopedics and patient will be seen in AM for Aspiration -Orthopedics PA Sheran Lawless evaluated for bilateral knee pain and aspirated and injected both knees with Steroids -Knee Aspirate showed Turbid Appearance with 11,275 WBC and C/x pending but showed NGTD <24 hours  -C/w Allopurinol 100 mg po Daily -PT recommending SNF   Severe Bilateral Lower Extremity Edema, significantly improved -No history of CHF.  Echocardiogram done  on last admission on 03/08/18 shows ejection fraction of 55 to 60%, mild left ventricular hypertrophy, no regional wall motion abnormality, could not fully evaluate left ventricular diastolic dysfunction.   -Started on IV Lasix 40 mg q12h and will continue today and stopped yesterday  -Edema is improved -Patient is -1,044 mL since admission and weight is down 4 lbs -Checking LE  Duplex to r/o DVT and was negative for DVT -? Hypoalbuminemia as potential cause of edema -Compression Stockings/TED Hose ordered  Squamous cell carcinoma of the base of tongue:  -Status post PEG tube.   -C/w TF with Jevity 1.2 Cal at 40 mL/hr and increased to 50 mL/hr -Nutrition consulted and changed Rate yesterday and adding 30 mL Prostat TID and Juven Fruit Punch -Follows with oncology as an outpatient.   -Status post 35th cycle of radiation therapy and fifth cycle of Chemotherapy -He was following with Dr. Lebron Conners and will need to find out new Oncologist  -Dr. Isidore Moos saw patient today and visited   Left Sided Pneumonia -Chest x-ray could not rule out already pneumonia in the left base on admission   -Started on Zosyn for now as patient is immunocompromised.   -Low suspicion for MRSA pneumonia so vancomycin not started.This could actually be an aspiration pneumonia due to persistent increased oropharyngeal secretion.  -Patient also has bothering cough. -C/w Guaifenesin-Dextromethorphan 10 mL q4hprn -Reported of mild grade fever at home. No leukocytosis. -Started Incentive Spirometry and  Continue Xopenex/Atrovent TID as patient is in and out of A Fib -Checked Respiratory Virus Panel and Place on Droplet Precautions but was Negative so Droplet Precautions discontinued  -Repeat CXR yesterday showed Poor aeration with persistent opacity at the left lung base and today it showed stable appearance.   Lactic Acidosis -Mild but now Improved -He was given 500 mL of fluid in the emergency department.   -Patient has severe bilateral lower extremity edema and bilateral basal crackles.   -IVF Stopped.  -Started on IV Diuresis with Lasix -If lactic acid level goes up, we can consider fluid. -Lactic Acid went from 2.33 -> 1.1 -> 0.8 -Improved   Diabetes Mellitus -On metformin and glipizide at home.  -Last hemoglobin A1c was 7.7. -Hold Oral Medications -C/w Moderate Novolog SSI q4h and  added Novolog 3 units q4h for BS >180 per Diabetes Education Coordinator Recc's -CBG's ranging from 267-279  Anemia of chronic disease/malignancy/chemotherapy:  -Hb/Hct went Stable at 8.4/26.8 -Continue to Monitor for S/Sx of Bleeding -Repeat CBC in AM   Hypertension -Currently blood pressures softer -Continue Metoprolol 25 mg po BID and Cardizem gtt for now -Continue to Monitor BP carefully   Persistent Oropharyngeal Secretion -Continue Scopolamine 1.5 mg TD Patch.  -This could also be the reason for possible aspiration pneumonia.   -C/w Yankauer Suction   Paroxysmal A. Fib with RVR with Spontaneous Conversion back and forth to NSR -Went into A Fib with RVR and is intermittently converting back and forth to NSR -Started on Cardizem gtt and transferred to SDU -Cardizem gtt is weaning and was titrated off today but went back into RVR and is now 5; May add po Cardizem 120 in AM if still uncontrolled and when stopping gtt.  -C/w Metoprolol 25 mg po BID  -C/w Anticoagulation with Apixaban 5 mg po q12h.  Status post PEG -Continue Tube Feeds. -Nutrition consulted and appreciate Recc's -Getting Jevity at 1.2 and rate increasing to 50 mL/hr and Nutritionist adding Prostat TID and Juven Fruit Punch  Hyperlipidemia -Continue Pravastatin 40 mg  po qHS  DVT prophylaxis: Anticoagulated with Apixaban Code Status: FULL CODE Family Communication: No family present at bedside Disposition Plan: Remain in SDU as patient is still on Cardizem gtt and SNF at D/C when medically stable  Consultants:   Orthopedic Surgery   Procedures:  Knee Aspiration and Injections   Antimicrobials: Anti-infectives (From admission, onward)   Start     Dose/Rate Route Frequency Ordered Stop   03/16/18 2200  piperacillin-tazobactam (ZOSYN) IVPB 3.375 g     3.375 g 12.5 mL/hr over 240 Minutes Intravenous Every 8 hours 03/16/18 1513     03/16/18 1515  piperacillin-tazobactam (ZOSYN) IVPB 3.375 g      3.375 g 100 mL/hr over 30 Minutes Intravenous  Once 03/16/18 1512 03/17/18 1609   03/16/18 1500  azithromycin (ZITHROMAX) tablet 500 mg  Status:  Discontinued     500 mg Oral Daily 03/16/18 1441 03/16/18 1501   03/16/18 1445  cefTRIAXone (ROCEPHIN) 1 g in sodium chloride 0.9 % 100 mL IVPB  Status:  Discontinued     1 g 200 mL/hr over 30 Minutes Intravenous Every 24 hours 03/16/18 1441 03/16/18 1501     Subjective: Seen and examined at and had no complaints and states he is feeling better.  States his legs are less swollen however he states his right leg still pretty weak.  No chest pain, shortness breath, nausea, vomiting.  Was alert and awake however did not know the year.  No other concerns or complaints at this time  Objective: Vitals:   03/19/18 1401 03/19/18 1500 03/19/18 1557 03/19/18 1800  BP:  (!) 106/55  108/66  Pulse:  74  72  Resp:  15  19  Temp:   97.7 F (36.5 C)   TempSrc:   Oral   SpO2: 99% 94%  96%  Weight:      Height:        Intake/Output Summary (Last 24 hours) at 03/19/2018 1922 Last data filed at 03/19/2018 1800 Gross per 24 hour  Intake 1764.5 ml  Output -  Net 1764.5 ml   Filed Weights   03/16/18 1733 03/16/18 2226 03/19/18 1300  Weight: 89.9 kg (198 lb 3.1 oz) 86.9 kg (191 lb 9.3 oz) 88 kg (194 lb 0.1 oz)   Examination: Physical Exam:  Constitutional: Well-nourished, well-developed Caucasian male in no acute distress.  Appears calm and is very pleasant to speak with. Eyes: Sclera anicteric.  Lids and conjunctive are normal. ENMT:  External ears and nose appear normal.  Grossly normal hearing Neck: Supple with no appreciable JVD Respiratory: Diminished to auscultation bilaterally especially on the left no appreciable wheezing, rales, rhonchi.. Unlabored breathing Cardiovascular: Irregularly irregular.  Trace to 1+ lower extremity edema.  Left knee is not as swollen Abdomen: Soft, nontender, nondistended.  Bowel sounds present in all 4 quadrants and  has PEG in place GU: Deferred Musculoskeletal: No contractures cyanosis.  Left leg is much improved. Skin: No appreciable rashes or lesions limited skin evaluation; has a port in place. Neurologic: Cranial nerves II through XII grossly intact.  Patient is globally weak. Psychiatric: Normal mood and affect.  Awake and alert and oriented x2.  Not as confused today  Data Reviewed: I have personally reviewed following labs and imaging studies  CBC: Recent Labs  Lab 03/16/18 1001 03/17/18 0447 03/18/18 0443 03/19/18 0412  WBC 4.0 4.0 3.7* 4.7  NEUTROABS 2.7  --  2.5 4.0  HGB 9.5* 8.4* 8.2* 8.4*  HCT 29.9* 26.3* 26.4* 26.8*  MCV  94.0 92.6 93.6 92.7  PLT 265 250 260 811   Basic Metabolic Panel: Recent Labs  Lab 03/16/18 1001 03/17/18 0447 03/18/18 0443 03/19/18 0412  NA 138 138 139 138  K 4.6 4.1 3.5 3.2*  CL 96* 93* 91* 88*  CO2 32 33* 38* 38*  GLUCOSE 205* 153* 265* 328*  BUN 26* 20 20 37*  CREATININE 0.77 0.88 0.89 0.87  CALCIUM 9.2 8.6* 8.4* 8.9  MG  --   --  1.8 2.1  PHOS  --   --  4.6 2.8   GFR: Estimated Creatinine Clearance: 83 mL/min (by C-G formula based on SCr of 0.87 mg/dL). Liver Function Tests: Recent Labs  Lab 03/16/18 1001 03/18/18 0443 03/19/18 0412  AST 17 20 29   ALT 26 27 36  ALKPHOS 64 58 67  BILITOT 0.6 0.7 0.4  PROT 6.6 5.9* 6.3*  ALBUMIN 2.9* 2.4* 2.3*   No results for input(s): LIPASE, AMYLASE in the last 168 hours. No results for input(s): AMMONIA in the last 168 hours. Coagulation Profile: No results for input(s): INR, PROTIME in the last 168 hours. Cardiac Enzymes: No results for input(s): CKTOTAL, CKMB, CKMBINDEX, TROPONINI in the last 168 hours. BNP (last 3 results) No results for input(s): PROBNP in the last 8760 hours. HbA1C: No results for input(s): HGBA1C in the last 72 hours. CBG: Recent Labs  Lab 03/18/18 2346 03/19/18 0420 03/19/18 0753 03/19/18 1142 03/19/18 1546  GLUCAP 391* 267* 268* 268* 279*   Lipid  Profile: No results for input(s): CHOL, HDL, LDLCALC, TRIG, CHOLHDL, LDLDIRECT in the last 72 hours. Thyroid Function Tests: No results for input(s): TSH, T4TOTAL, FREET4, T3FREE, THYROIDAB in the last 72 hours. Anemia Panel: No results for input(s): VITAMINB12, FOLATE, FERRITIN, TIBC, IRON, RETICCTPCT in the last 72 hours. Sepsis Labs: Recent Labs  Lab 03/16/18 1034 03/16/18 2033 03/17/18 0447  LATICACIDVEN 2.33* 1.1 0.8    Recent Results (from the past 240 hour(s))  MRSA PCR Screening     Status: None   Collection Time: 03/17/18  6:55 AM  Result Value Ref Range Status   MRSA by PCR NEGATIVE NEGATIVE Final    Comment:        The GeneXpert MRSA Assay (FDA approved for NASAL specimens only), is one component of a comprehensive MRSA colonization surveillance program. It is not intended to diagnose MRSA infection nor to guide or monitor treatment for MRSA infections. Performed at [facility], [place] [address] [place], [place] [address]   Respiratory Panel by PCR     Status: None   Collection Time: 03/17/18  9:12 AM  Result Value Ref Range Status   Adenovirus NOT DETECTED NOT DETECTED Final   Coronavirus 229E NOT DETECTED NOT DETECTED Final   Coronavirus HKU1 NOT DETECTED NOT DETECTED Final   Coronavirus NL63 NOT DETECTED NOT DETECTED Final   Coronavirus OC43 NOT DETECTED NOT DETECTED Final   Metapneumovirus NOT DETECTED NOT DETECTED Final   Rhinovirus / Enterovirus NOT DETECTED NOT DETECTED Final   Influenza A NOT DETECTED NOT DETECTED Final   Influenza B NOT DETECTED NOT DETECTED Final   Parainfluenza Virus 1 NOT DETECTED NOT DETECTED Final   Parainfluenza Virus 2 NOT DETECTED NOT DETECTED Final   Parainfluenza Virus 3 NOT DETECTED NOT DETECTED Final   Parainfluenza Virus 4 NOT DETECTED NOT DETECTED Final   Respiratory Syncytial Virus NOT DETECTED NOT DETECTED Final   Bordetella pertussis NOT DETECTED NOT DETECTED Final   Chlamydophila pneumoniae NOT  DETECTED NOT  DETECTED Final   Mycoplasma pneumoniae NOT DETECTED NOT DETECTED Final    Comment: Performed at [facility], [place] [address], [place], [place] [address]  Body fluid culture     Status: None (Preliminary result)   Collection Time: 03/18/18  9:56 AM  Result Value Ref Range Status   Specimen Description   Final    KNEE Performed at [place] [address], [place], [place] [address]    Special Requests   Final    RIGHT Performed at [facility], [place] [address], [place], [place] [address]    Gram Stain   Final    RARE WBC PRESENT,BOTH PMN AND MONONUCLEAR NO ORGANISMS SEEN    Culture   Final    NO GROWTH < 24 HOURS Performed at [facility], [place] [address] [place], [place] [address]    Report Status PENDING  Incomplete  Gonococcus culture     Status: None (Preliminary result)   Collection Time: 03/18/18  9:58 AM  Result Value Ref Range Status   Specimen Description   Final    KNEE Performed at [place] [address] [place], [place] [address]    Special Requests   Final    RIGHT Performed at [facility], [place] [address], [place], [place] [address]    Culture   Final    NO GROWTH < 24 HOURS Performed at [place] [address] [place], [place] [address]    Report Status PENDING  Incomplete     Radiology Studies: Dg Chest Port 1 View  Result Date: 03/19/2018 CLINICAL DATA:  Shortness of Breath EXAM: PORTABLE CHEST 1 VIEW COMPARISON:  03/18/2018 FINDINGS: Cardiac shadow is stable. Left-sided chest wall port is noted. The overall inspiratory effort remains poor with some left basilar atelectatic changes no new focal abnormality is noted. IMPRESSION: Stable appearance of the chest. Electronically Signed   By: Inez Catalina M.D.   On: 03/19/2018 08:50   Dg Chest Port 1 View  Result Date: 03/18/2018 CLINICAL DATA:  Shortness of breath EXAM: PORTABLE CHEST 1  VIEW COMPARISON:  Chest x-ray of 03/16/2018 FINDINGS: The lungs again are poorly aerated with persistent opacity at the left lung base some of which represents atelectasis with apparent elevation of the left hemidiaphragm. Left subpulmonic effusion cannot be excluded. Heart size is stable. Port-A-Cath remains. IMPRESSION: Poor aeration with persistent opacity at the left lung base. Electronically Signed   By: Ivar Drape M.D.   On: 03/18/2018 08:44   Scheduled Meds: . allopurinol  100 mg Oral Daily  . apixaban  5 mg Oral Q12H  . chlorhexidine  15 mL Mouth Rinse BID  . Chlorhexidine Gluconate Cloth  6 each Topical Daily  . diclofenac sodium  2 g Topical QID  . feeding supplement (PRO-STAT SUGAR FREE 64)  30 mL Per Tube TID  . insulin aspart  0-15 Units Subcutaneous Q4H  . insulin aspart  3 Units Subcutaneous Q4H  . ipratropium  0.5 mg Nebulization TID  . levalbuterol  0.63 mg Nebulization TID  . mouth rinse  15 mL Mouth Rinse q12n4p  . metoprolol tartrate  25 mg Oral BID  . nutrition supplement (JUVEN)  1 packet Per Tube BID BM  . polyvinyl alcohol  1 drop Both Eyes Daily  . potassium chloride  40 mEq Per Tube BID  . pravastatin  40 mg Oral QPM  . scopolamine  1 patch  Transdermal Q72H  . sodium chloride flush  10-40 mL Intracatheter Q12H  . vitamin B-12  500 mcg Oral BID   Continuous Infusions: . diltiazem (CARDIZEM) infusion 7.5 mg/hr (03/19/18 0942)  . feeding supplement (JEVITY 1.5 CAL/FIBER) 1,000 mL (03/19/18 1242)  . piperacillin-tazobactam (ZOSYN)  IV Stopped (03/19/18 1837)    LOS: 3 days   Kerney Elbe, DO [facility] Pager [phone]  If 7PM-7AM, please contact night-coverage [email or web address] Password [facility] 03/19/2018, 7:22 PM

## 2018-03-20 ENCOUNTER — Inpatient Hospital Stay (HOSPITAL_COMMUNITY): Payer: Medicare Other

## 2018-03-20 ENCOUNTER — Telehealth: Payer: Self-pay | Admitting: *Deleted

## 2018-03-20 DIAGNOSIS — Z7901 Long term (current) use of anticoagulants: Secondary | ICD-10-CM

## 2018-03-20 DIAGNOSIS — C01 Malignant neoplasm of base of tongue: Secondary | ICD-10-CM

## 2018-03-20 DIAGNOSIS — E118 Type 2 diabetes mellitus with unspecified complications: Secondary | ICD-10-CM

## 2018-03-20 DIAGNOSIS — I480 Paroxysmal atrial fibrillation: Secondary | ICD-10-CM

## 2018-03-20 DIAGNOSIS — T451X5A Adverse effect of antineoplastic and immunosuppressive drugs, initial encounter: Secondary | ICD-10-CM

## 2018-03-20 DIAGNOSIS — D6481 Anemia due to antineoplastic chemotherapy: Secondary | ICD-10-CM

## 2018-03-20 DIAGNOSIS — I10 Essential (primary) hypertension: Secondary | ICD-10-CM

## 2018-03-20 DIAGNOSIS — R531 Weakness: Principal | ICD-10-CM

## 2018-03-20 LAB — COMPREHENSIVE METABOLIC PANEL
ALBUMIN: 2.3 g/dL — AB (ref 3.5–5.0)
ALT: 71 U/L — AB (ref 17–63)
AST: 49 U/L — ABNORMAL HIGH (ref 15–41)
Alkaline Phosphatase: 68 U/L (ref 38–126)
Anion gap: 11 (ref 5–15)
BUN: 52 mg/dL — ABNORMAL HIGH (ref 6–20)
CHLORIDE: 94 mmol/L — AB (ref 101–111)
CO2: 38 mmol/L — AB (ref 22–32)
Calcium: 9 mg/dL (ref 8.9–10.3)
Creatinine, Ser: 0.9 mg/dL (ref 0.61–1.24)
GFR calc Af Amer: >60 mL/min (ref >60)
GFR calc non Af Amer: >60 mL/min (ref >60)
GLUCOSE: 198 mg/dL — AB (ref 65–99)
POTASSIUM: 4.1 mmol/L (ref 3.5–5.1)
Sodium: 143 mmol/L (ref 135–145)
Total Bilirubin: 0.5 mg/dL (ref 0.3–1.2)
Total Protein: 6 g/dL — ABNORMAL LOW (ref 6.5–8.1)

## 2018-03-20 LAB — CBC WITH DIFFERENTIAL/PLATELET
BASOS PCT: 0 %
Basophils Absolute: 0 10*3/uL (ref 0.0–0.1)
EOS PCT: 0 %
Eosinophils Absolute: 0 10*3/uL (ref 0.0–0.7)
HCT: 26.5 % — ABNORMAL LOW (ref 39.0–52.0)
Hemoglobin: 8.3 g/dL — ABNORMAL LOW (ref 13.0–17.0)
Lymphocytes Relative: 3 %
Lymphs Abs: 0.2 10*3/uL — ABNORMAL LOW (ref 0.7–4.0)
MCH: 29.4 pg (ref 26.0–34.0)
MCHC: 31.3 g/dL (ref 30.0–36.0)
MCV: 94 fL (ref 78.0–100.0)
MONO ABS: 0.8 10*3/uL (ref 0.1–1.0)
Monocytes Relative: 11 %
NEUTROS ABS: 6.5 10*3/uL (ref 1.7–7.7)
Neutrophils Relative %: 86 %
PLATELETS: 270 10*3/uL (ref 150–400)
RBC: 2.82 MIL/uL — AB (ref 4.22–5.81)
RDW: 17.5 % — AB (ref 11.5–15.5)
WBC: 7.5 10*3/uL (ref 4.0–10.5)

## 2018-03-20 LAB — GLUCOSE, CAPILLARY
GLUCOSE-CAPILLARY: 266 mg/dL — AB (ref 65–99)
GLUCOSE-CAPILLARY: 316 mg/dL — AB (ref 65–99)
GLUCOSE-CAPILLARY: 291 mg/dL — AB (ref 65–99)
Glucose-Capillary: 163 mg/dL — ABNORMAL HIGH (ref 65–99)
Glucose-Capillary: 166 mg/dL — ABNORMAL HIGH (ref 65–99)
Glucose-Capillary: 218 mg/dL — ABNORMAL HIGH (ref 65–99)

## 2018-03-20 LAB — PHOSPHORUS: Phosphorus: 3.2 mg/dL (ref 2.5–4.6)

## 2018-03-20 LAB — MAGNESIUM: Magnesium: 2.2 mg/dL (ref 1.7–2.4)

## 2018-03-20 LAB — GONOCOCCUS CULTURE: Culture: NO GROWTH

## 2018-03-20 MED ORDER — GLIPIZIDE 5 MG PO TABS
5.0000 mg | ORAL_TABLET | Freq: Every day | ORAL | Status: DC
  Administered 2018-03-21 – 2018-03-22 (×2): 5 mg via ORAL
  Filled 2018-03-20 (×2): qty 1

## 2018-03-20 MED ORDER — LORAZEPAM 2 MG/ML IJ SOLN
0.5000 mg | Freq: Once | INTRAMUSCULAR | Status: AC
  Administered 2018-03-20: 0.5 mg via INTRAVENOUS
  Filled 2018-03-20: qty 1

## 2018-03-20 MED ORDER — TRAMADOL HCL 50 MG PO TABS
50.0000 mg | ORAL_TABLET | Freq: Two times a day (BID) | ORAL | Status: DC | PRN
  Administered 2018-03-21: 50 mg
  Filled 2018-03-20: qty 1

## 2018-03-20 MED ORDER — DILTIAZEM HCL 60 MG PO TABS
60.0000 mg | ORAL_TABLET | Freq: Four times a day (QID) | ORAL | Status: DC
  Administered 2018-03-20 – 2018-03-22 (×10): 60 mg via ORAL
  Filled 2018-03-20 (×10): qty 1

## 2018-03-20 MED ORDER — GLIPIZIDE 2.5 MG HALF TABLET: 2.5000 mg | ORAL_TABLET | ORAL | Status: DC

## 2018-03-20 MED ORDER — TRAMADOL HCL 50 MG PO TABS: 50.0000 mg | ORAL_TABLET | Freq: Two times a day (BID) | ORAL | Status: DC | PRN

## 2018-03-20 MED ORDER — POLYETHYLENE GLYCOL 3350 17 G PO PACK
17.0000 g | PACK | Freq: Every day | ORAL | Status: DC
  Filled 2018-03-20: qty 1

## 2018-03-20 MED ORDER — METFORMIN HCL 500 MG PO TABS
1000.0000 mg | ORAL_TABLET | Freq: Two times a day (BID) | ORAL | Status: DC
  Administered 2018-03-20 – 2018-03-22 (×5): 1000 mg via ORAL
  Filled 2018-03-20 (×6): qty 2

## 2018-03-20 MED ORDER — GLIPIZIDE 5 MG PO TABS
2.5000 mg | ORAL_TABLET | Freq: Every day | ORAL | Status: DC
  Administered 2018-03-20 – 2018-03-22 (×3): 2.5 mg via ORAL
  Filled 2018-03-20 (×4): qty 1

## 2018-03-20 MED ORDER — DILTIAZEM HCL 60 MG PO TABS: 120.0000 mg | ORAL_TABLET | Freq: Two times a day (BID) | ORAL | Status: DC

## 2018-03-20 MED ORDER — BISACODYL 5 MG PO TBEC: 5.0000 mg | DELAYED_RELEASE_TABLET | Freq: Every day | ORAL | Status: DC | PRN

## 2018-03-20 MED ORDER — PROPYLENE GLYCOL 0.6 % OP SOLN: 1.0000 [drp] | Freq: Two times a day (BID) | OPHTHALMIC | Status: DC

## 2018-03-20 MED ORDER — DILTIAZEM HCL ER COATED BEADS 120 MG PO CP24
240.0000 mg | ORAL_CAPSULE | Freq: Every day | ORAL | Status: DC
  Filled 2018-03-20: qty 2

## 2018-03-20 NOTE — Progress Notes (Signed)
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  Upon ED evaluation patient was hemodynamically stable, chest x-ray was questionable for pneumonia and patient was admitted for empiric antibiotics.  Patient subsequently developed A. fib and was placed on Cardizem drip.  Orthopedic was consulted due to bilateral knee swelling.  Subjective: Patient seen and examined, today complaining of severe cough.  Heart rate improved.  No chest pain or dizziness.  Bilateral lower extremity pain has improved after steroid injection.  Patient remains afebrile.  Assessment & Plan: Bilateral lower extremity edema complicated with weakness  Multifactorial, likely from diastolic dysfunction and venous insufficiency Swelling significantly improved, being treated with IV Lasix and leg elevation. Lower extremity duplex negative for DVT Continue TED hose and leg elevation   B/L Osteoarthritis with swelling  S/p knee aspiration and steroid injection ?  Gout continue allopurinol Cultures from knee aspiration negative so far PT recommending SNF Orthopedic recommendations appreciated.  A. fib with RVR Heart rate has improved with Cardizem drip, bouncing back and forth from normal sinus rhythm. Will transition to oral Cardizem, monitor heart rate if not improved can increase metoprolol. On Eliquis for anticoagulation  Diabetes mellitus type 2 CBGs slight elevated Will resume metformin, continue sliding scale and NovoLog with meals Continue to monitor   Left side, persistent pneumonia Chest x-ray could  not rule out already pneumonia in the left base. Patient on Zosyn given immunocompromise status, MRSA negative Patient with increased oropharyngeal secretion ?  Aspiration.  Will de-escalate Zosyn to Unasyn for now. Will consider CT chest if no signs of improvement Continue supportive treatment with nebulizers and Robitussin. Respiratory viral panel negative.  Anemia of chronic disease/malignancy/chemotherapy Hemoglobin currently stable No signs of overt bleeding Continue to monitor CBC  Hypertension BP stable Continue to monitor closely  Squamous cell carcinoma of the base of the tongue Status post PEG tube Oncology follow-up as an outpatient On chemo and radiation Some oropharyngeal secretions, scopolamine patch was started.  DVT prophylaxis: Eliquis Code Status: Full code Family Communication: Wife at bedside Disposition Plan: If heart rate improved can transfer to telemetry, SNF in 2 to 3 days.   Consultants:   Orthopedic surgery  Procedures:   Knee aspiration  Antimicrobials: Anti-infectives (From admission, onward)   Start     Dose/Rate Route Frequency Ordered Stop   03/16/18 2200  piperacillin-tazobactam (ZOSYN) IVPB 3.375 g  Status:  Discontinued     3.375 g 12.5 mL/hr over 240 Minutes Intravenous Every 8 hours 03/16/18 1513 03/20/18 0905   03/16/18 1515  piperacillin-tazobactam (ZOSYN) IVPB 3.375 g     3.375 g 100 mL/hr over 30 Minutes Intravenous  Once 03/16/18 1512 03/17/18 1609   03/16/18 1500  azithromycin (ZITHROMAX) tablet 500 mg  Status:  Discontinued     500 mg Oral Daily 03/16/18 1441 03/16/18 1501   03/16/18 1445  cefTRIAXone (ROCEPHIN) 1 g in sodium chloride 0.9 % 100 mL IVPB  Status:  Discontinued     1 g 200 mL/hr over 30 Minutes Intravenous Every 24 hours 03/16/18 1441 03/16/18 1501          Objective: Vitals:  03/20/18 0600 03/20/18 0700 03/20/18 0800 03/20/18 0921  BP: 113/64 126/64 132/75   Pulse: 76 75 77   Resp: 14 15 18      Temp:      TempSrc:      SpO2: 93% 96% 95% 95%  Weight:      Height:        Intake/Output Summary (Last 24 hours) at 03/20/2018 0947 Last data filed at 03/20/2018 0900 Gross per 24 hour  Intake 1011.62 ml  Output -  Net 1011.62 ml   Filed Weights   03/16/18 1733 03/16/18 2226 03/19/18 1300  Weight: 89.9 kg (198 lb 3.1 oz) 86.9 kg (191 lb 9.3 oz) 88 kg (194 lb 0.1 oz)    Examination:  General exam: NAD   HEENT: OP moist and clear Respiratory system: Decrease BS b/l No wheezing or crackles  Cardiovascular system: S1S2 Irr Irr. No JVD, murmurs, rubs or gallops Gastrointestinal system: Abdomen is nondistended, soft and nontender. PEG tube in place  Central nervous system: Alert and oriented. No focal neurological deficits. Extremities: No LE edema  Skin: No rashes, lesions or ulcers Psychiatry: Mood & affect appropriate.    Data Reviewed: I have personally reviewed following labs and imaging studies  CBC: Recent Labs  Lab 03/16/18 1001 03/17/18 0447 03/18/18 0443 03/19/18 0412 03/20/18 0432  WBC 4.0 4.0 3.7* 4.7 7.5  NEUTROABS 2.7  --  2.5 4.0 6.5  HGB 9.5* 8.4* 8.2* 8.4* 8.3*  HCT 29.9* 26.3* 26.4* 26.8* 26.5*  MCV 94.0 92.6 93.6 92.7 94.0  PLT 265 250 260 277 194   Basic Metabolic Panel: Recent Labs  Lab 03/16/18 1001 03/17/18 0447 03/18/18 0443 03/19/18 0412 03/20/18 0432  NA 138 138 139 138 143  K 4.6 4.1 3.5 3.2* 4.1  CL 96* 93* 91* 88* 94*  CO2 32 33* 38* 38* 38*  GLUCOSE 205* 153* 265* 328* 198*  BUN 26* 20 20 37* 52*  CREATININE 0.77 0.88 0.89 0.87 0.90  CALCIUM 9.2 8.6* 8.4* 8.9 9.0  MG  --   --  1.8 2.1 2.2  PHOS  --   --  4.6 2.8 3.2   GFR: Estimated Creatinine Clearance: 80.2 mL/min (by C-G formula based on SCr of 0.9 mg/dL). Liver Function Tests: Recent Labs  Lab 03/16/18 1001 03/18/18 0443 03/19/18 0412 03/20/18 0432  AST 17 20 29  49*  ALT 26 27 36 71*  ALKPHOS 64 58 67 68  BILITOT 0.6 0.7 0.4 0.5  PROT 6.6 5.9* 6.3* 6.0*   ALBUMIN 2.9* 2.4* 2.3* 2.3*   No results for input(s): LIPASE, AMYLASE in the last 168 hours. No results for input(s): AMMONIA in the last 168 hours. Coagulation Profile: No results for input(s): INR, PROTIME in the last 168 hours. Cardiac Enzymes: No results for input(s): CKTOTAL, CKMB, CKMBINDEX, TROPONINI in the last 168 hours. BNP (last 3 results) No results for input(s): PROBNP in the last 8760 hours. HbA1C: No results for input(s): HGBA1C in the last 72 hours. CBG: Recent Labs  Lab 03/19/18 1546 03/19/18 2014 03/19/18 2332 03/20/18 0329 03/20/18 0742  GLUCAP 279* 295* 366* 163* 266*   Lipid Profile: No results for input(s): CHOL, HDL, LDLCALC, TRIG, CHOLHDL, LDLDIRECT in the last 72 hours. Thyroid Function Tests: No results for input(s): TSH, T4TOTAL, FREET4, T3FREE, THYROIDAB in the last 72 hours. Anemia Panel: No results for input(s): VITAMINB12, FOLATE, FERRITIN, TIBC, IRON, RETICCTPCT in the last 72 hours. Sepsis Labs: Recent Labs  Lab 03/16/18 1034 03/16/18 2033 03/17/18 0447  LATICACIDVEN 2.33* 1.1 0.8    Recent Results (from the past 240 hour(s))  MRSA PCR Screening     Status: None   Collection Time: 03/17/18  6:55 AM  Result Value Ref Range Status   MRSA by PCR NEGATIVE NEGATIVE Final    Comment:        The GeneXpert MRSA Assay (FDA approved for NASAL specimens only), is one component of a comprehensive MRSA colonization surveillance program. It is not intended to diagnose MRSA infection nor to guide or monitor treatment for MRSA infections. Performed at [facility], [place] [address] [place], [place] [address]   Respiratory Panel by PCR     Status: None   Collection Time: 03/17/18  9:12 AM  Result Value Ref Range Status   Adenovirus NOT DETECTED NOT DETECTED Final   Coronavirus 229E NOT DETECTED NOT DETECTED Final   Coronavirus HKU1 NOT DETECTED NOT DETECTED Final   Coronavirus NL63 NOT DETECTED NOT DETECTED Final    Coronavirus OC43 NOT DETECTED NOT DETECTED Final   Metapneumovirus NOT DETECTED NOT DETECTED Final   Rhinovirus / Enterovirus NOT DETECTED NOT DETECTED Final   Influenza A NOT DETECTED NOT DETECTED Final   Influenza B NOT DETECTED NOT DETECTED Final   Parainfluenza Virus 1 NOT DETECTED NOT DETECTED Final   Parainfluenza Virus 2 NOT DETECTED NOT DETECTED Final   Parainfluenza Virus 3 NOT DETECTED NOT DETECTED Final   Parainfluenza Virus 4 NOT DETECTED NOT DETECTED Final   Respiratory Syncytial Virus NOT DETECTED NOT DETECTED Final   Bordetella pertussis NOT DETECTED NOT DETECTED Final   Chlamydophila pneumoniae NOT DETECTED NOT DETECTED Final   Mycoplasma pneumoniae NOT DETECTED NOT DETECTED Final    Comment: Performed at [facility], [place] [address] [place], [place] [address]  Body fluid culture     Status: None (Preliminary result)   Collection Time: 03/18/18  9:56 AM  Result Value Ref Range Status   Specimen Description   Final    KNEE Performed at [facility] [address] [place], [place] [address]    Special Requests   Final    RIGHT Performed at [facility], [place] [address] [place], [place] [address]    Gram Stain   Final    RARE WBC PRESENT,BOTH PMN AND MONONUCLEAR NO ORGANISMS SEEN    Culture   Final    NO GROWTH < 24 HOURS Performed at [facility], [place] [address] [place], [place] [address]    Report Status PENDING  Incomplete  Gonococcus culture     Status: None   Collection Time: 03/18/18  9:58 AM  Result Value Ref Range Status   Specimen Description   Final    KNEE Performed at [facility] [address], [place], [place] [address]    Special Requests   Final    RIGHT Performed at [facility], [place] [address], [place], [place] [address]    Culture   Final    NO GROWTH 2 DAYS Performed at [facility] [address], [place], [place] [address]    Report  Status 03/20/2018 FINAL  Final     Radiology Studies: Dg Chest Port 1 View  Result Date: 03/20/2018 CLINICAL DATA:  Shortness of breath EXAM: PORTABLE CHEST 1 VIEW COMPARISON:  March 19, 2018 FINDINGS: Port-A-Cath tip is in the right atrium. No pneumothorax. There is left base atelectatic change. There is no edema or  consolidation. Heart is upper normal in size with pulmonary vascularity normal. No adenopathy. No evident Cheever lesions. IMPRESSION: Left base atelectasis. No edema or consolidation. Stable cardiac silhouette. No pneumothorax. Electronically Signed   By: Lowella Grip III M.D.   On: 03/20/2018 07:12   Dg Chest Port 1 View  Result Date: 03/19/2018 CLINICAL DATA:  Shortness of Breath EXAM: PORTABLE CHEST 1 VIEW COMPARISON:  03/18/2018 FINDINGS: Cardiac shadow is stable. Left-sided chest wall port is noted. The overall inspiratory effort remains poor with some left basilar atelectatic changes no new focal abnormality is noted. IMPRESSION: Stable appearance of the chest. Electronically Signed   By: Inez Catalina M.D.   On: 03/19/2018 08:50      Scheduled Meds: . allopurinol  100 mg Oral Daily  . apixaban  5 mg Oral Q12H  . chlorhexidine  15 mL Mouth Rinse BID  . Chlorhexidine Gluconate Cloth  6 each Topical Daily  . diclofenac sodium  2 g Topical QID  . diltiazem  240 mg Oral Daily  . feeding supplement (PRO-STAT SUGAR FREE 64)  30 mL Per Tube TID  . insulin aspart  0-15 Units Subcutaneous Q4H  . insulin aspart  3 Units Subcutaneous Q4H  . ipratropium  0.5 mg Nebulization TID  . levalbuterol  0.63 mg Nebulization TID  . mouth rinse  15 mL Mouth Rinse q12n4p  . metoprolol tartrate  25 mg Oral BID  . nutrition supplement (JUVEN)  1 packet Per Tube BID BM  . polyvinyl alcohol  1 drop Both Eyes Daily  . pravastatin  40 mg Oral QPM  . scopolamine  1 patch Transdermal Q72H  . sodium chloride flush  10-40 mL Intracatheter Q12H  . vitamin B-12  500 mcg Oral BID   Continuous  Infusions: . feeding supplement (JEVITY 1.5 CAL/FIBER) 50 mL/hr at 03/20/18 0804     LOS: 4 days    Time spent: Total of 35 minutes spent with pt, greater than 50% of which was spent in discussion of  treatment, counseling and coordination of care   Chipper Oman, MD Pager: Text Page via [email or web address]   If 7PM-7AM, please contact night-coverage [email or web address] 03/20/2018, 9:47 AM   Note - This record has been created using [employer]. Chart creation errors have been sought, but may not always have been located. Such creation errors do not reflect on the standard of medical care.

## 2018-03-20 NOTE — Telephone Encounter (Signed)
Called patient to inform of fu appt. On 04-12-18 @ 11:40 am, spoke with patient and he is aware of this appt.

## 2018-03-21 ENCOUNTER — Encounter: Payer: Self-pay | Admitting: *Deleted

## 2018-03-21 DIAGNOSIS — M109 Gout, unspecified: Secondary | ICD-10-CM

## 2018-03-21 LAB — CBC WITH DIFFERENTIAL/PLATELET
BASOS ABS: 0 10*3/uL (ref 0.0–0.1)
Basophils Relative: 0 %
EOS ABS: 0 10*3/uL (ref 0.0–0.7)
EOS PCT: 0 %
HCT: 28 % — ABNORMAL LOW (ref 39.0–52.0)
Hemoglobin: 8.7 g/dL — ABNORMAL LOW (ref 13.0–17.0)
Lymphocytes Relative: 4 %
Lymphs Abs: 0.3 10*3/uL — ABNORMAL LOW (ref 0.7–4.0)
MCH: 29 pg (ref 26.0–34.0)
MCHC: 31.1 g/dL (ref 30.0–36.0)
MCV: 93.3 fL (ref 78.0–100.0)
Monocytes Absolute: 0.8 10*3/uL (ref 0.1–1.0)
Monocytes Relative: 11 %
Neutro Abs: 5.9 10*3/uL (ref 1.7–7.7)
Neutrophils Relative %: 85 %
PLATELETS: 255 10*3/uL (ref 150–400)
RBC: 3 MIL/uL — ABNORMAL LOW (ref 4.22–5.81)
RDW: 17.6 % — AB (ref 11.5–15.5)
WBC: 6.9 10*3/uL (ref 4.0–10.5)

## 2018-03-21 LAB — COMPREHENSIVE METABOLIC PANEL
ALK PHOS: 70 U/L (ref 38–126)
ALT: 96 U/L — AB (ref 17–63)
AST: 51 U/L — AB (ref 15–41)
Albumin: 2.4 g/dL — ABNORMAL LOW (ref 3.5–5.0)
Anion gap: 7 (ref 5–15)
BILIRUBIN TOTAL: 0.7 mg/dL (ref 0.3–1.2)
BUN: 53 mg/dL — AB (ref 6–20)
CALCIUM: 8.8 mg/dL — AB (ref 8.9–10.3)
CO2: 36 mmol/L — ABNORMAL HIGH (ref 22–32)
CREATININE: 0.77 mg/dL (ref 0.61–1.24)
Chloride: 98 mmol/L — ABNORMAL LOW (ref 101–111)
GFR calc Af Amer: >60 mL/min (ref >60)
Glucose, Bld: 214 mg/dL — ABNORMAL HIGH (ref 65–99)
Potassium: 3.9 mmol/L (ref 3.5–5.1)
Sodium: 141 mmol/L (ref 135–145)
TOTAL PROTEIN: 5.8 g/dL — AB (ref 6.5–8.1)

## 2018-03-21 LAB — GLUCOSE, CAPILLARY
Glucose-Capillary: 204 mg/dL — ABNORMAL HIGH (ref 65–99)
Glucose-Capillary: 165 mg/dL — ABNORMAL HIGH (ref 65–99)
Glucose-Capillary: 212 mg/dL — ABNORMAL HIGH (ref 65–99)
Glucose-Capillary: 215 mg/dL — ABNORMAL HIGH (ref 65–99)
Glucose-Capillary: 159 mg/dL — ABNORMAL HIGH (ref 65–99)

## 2018-03-21 LAB — BODY FLUID CULTURE: Culture: NO GROWTH

## 2018-03-21 LAB — MAGNESIUM: MAGNESIUM: 2 mg/dL (ref 1.7–2.4)

## 2018-03-21 MED ORDER — COLCHICINE 0.6 MG PO TABS
1.2000 mg | ORAL_TABLET | Freq: Once | ORAL | Status: AC
  Administered 2018-03-21: 1.2 mg via ORAL
  Filled 2018-03-21: qty 2

## 2018-03-21 MED ORDER — COLCHICINE 0.6 MG PO TABS
0.6000 mg | ORAL_TABLET | Freq: Every day | ORAL | Status: DC
  Administered 2018-03-22: 0.6 mg via ORAL
  Filled 2018-03-21: qty 1

## 2018-03-21 MED ORDER — AMOXICILLIN-POT CLAVULANATE 875-125 MG PO TABS
1.0000 | ORAL_TABLET | Freq: Two times a day (BID) | ORAL | Status: DC
  Administered 2018-03-21 – 2018-03-22 (×3): 1 via ORAL
  Filled 2018-03-21 (×3): qty 1

## 2018-03-21 MED ORDER — LEVALBUTEROL HCL 0.63 MG/3ML IN NEBU
0.6300 mg | INHALATION_SOLUTION | Freq: Two times a day (BID) | RESPIRATORY_TRACT | Status: DC
  Administered 2018-03-21 – 2018-03-22 (×2): 0.63 mg via RESPIRATORY_TRACT
  Filled 2018-03-21 (×2): qty 3

## 2018-03-21 MED ORDER — IPRATROPIUM BROMIDE 0.02 % IN SOLN
0.5000 mg | Freq: Two times a day (BID) | RESPIRATORY_TRACT | Status: DC
  Administered 2018-03-21 – 2018-03-22 (×2): 0.5 mg via RESPIRATORY_TRACT
  Filled 2018-03-21 (×2): qty 2.5

## 2018-03-21 MED ORDER — POLYVINYL ALCOHOL 1.4 % OP SOLN
1.0000 [drp] | Freq: Two times a day (BID) | OPHTHALMIC | Status: DC
  Filled 2018-03-21: qty 15

## 2018-03-21 MED ORDER — PROPYLENE GLYCOL 0.6 % OP SOLN
1.0000 [drp] | Freq: Two times a day (BID) | OPHTHALMIC | Status: DC
  Administered 2018-03-21 – 2018-03-22 (×2): 1 [drp] via OPHTHALMIC

## 2018-03-21 NOTE — Progress Notes (Signed)
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  Upon ED evaluation patient was hemodynamically stable, chest x-ray was questionable for pneumonia and patient was admitted for empiric antibiotics.  Patient subsequently developed A. fib and was placed on Cardizem drip.  Orthopedic was consulted due to bilateral knee swelling.  Subjective: Patient see and examined, he continues to improves, heart rate is well controlled, cough has improved.  Denies chest pain, shortness of breath and palpitation. Right knee continues to bother him.  Assessment & Plan: Bilateral lower extremity edema complicated with weakness - improving  Multifactorial, likely from diastolic dysfunction and venous insufficiency Swelling significantly improved, was treated with IV Lasix and leg elevation. Lower extremity duplex negative for DVT Continue TED hose and leg elevation   B/L Osteoarthritis/R knee Gout  S/p knee aspiration and steroid injection Fluid analysis with + for crystals, will start colchicine, if no improvement will give a trial of steroids  Cultures from knee aspiration negative so far PT recommending SNF Orthopedic recommendations appreciated.  A. fib with RVR - Improved  Initially treated with  Cardizem drip, bouncing back and forth from normal sinus rhythm. Continue Cardizem 60 mg QID and metoprolol 25 mg BID, HR well controlled  Continue Eliquis for anticoagulation  Diabetes mellitus type 2 CBGs acceptable Continue metformin, continue sliding scale and NovoLog with meals Continue to  monitor  Left side, persistent pneumonia - improving  Chest x-ray could not rule out already pneumonia in the left base. Patient on Zosyn given immunocompromise status, MRSA negative, vancomycin d/ced.  D/c Zosyn and start Augmentin  Patient with increased oropharyngeal secretion ?  Aspiration.  Continue supportive treatment with nebulizers and Robitussin. Respiratory viral panel negative.  Anemia of chronic disease/malignancy/chemotherapy Hemoglobin remains stable No signs of overt bleeding Continue to monitor  Hypertension BP stable Continue to monitor closely  Squamous cell carcinoma of the base of the tongue Status post PEG tube Oncology follow-up as an outpatient On chemo and radiation Some oropharyngeal secretions, scopolamine patch was started.  DVT prophylaxis: Eliquis Code Status: Full code Family Communication: Wife at bedside Disposition Plan: Transfer to telemetry, SNF in a.m. if remains stable  Consultants:   Orthopedic surgery  Procedures:   Knee aspiration  Antimicrobials: Anti-infectives (From admission, onward)   Start     Dose/Rate Route Frequency Ordered Stop   03/16/18 2200  piperacillin-tazobactam (ZOSYN) IVPB 3.375 g  Status:  Discontinued     3.375 g 12.5 mL/hr over 240 Minutes Intravenous Every 8 hours 03/16/18 1513 03/20/18 0905   03/16/18 1515  piperacillin-tazobactam (ZOSYN) IVPB 3.375 g     3.375 g 100 mL/hr over 30 Minutes Intravenous  Once 03/16/18 1512 03/17/18 1609   03/16/18 1500  azithromycin (ZITHROMAX) tablet 500 mg  Status:  Discontinued     500 mg Oral Daily 03/16/18 1441 03/16/18 1501   03/16/18 1445  cefTRIAXone (ROCEPHIN) 1 g in sodium chloride 0.9 % 100 mL IVPB  Status:  Discontinued     1 g 200 mL/hr over 30 Minutes Intravenous Every 24 hours 03/16/18 1441 03/16/18 1501         Objective: Vitals:  03/21/18 0500 03/21/18 0600 03/21/18 0743 03/21/18 0835  BP:      Pulse: 76 (!) 57    Resp: 20 19    Temp:   98.3  F (36.8 C)   TempSrc:   Oral   SpO2: 96% 98%  99%  Weight:      Height:        Intake/Output Summary (Last 24 hours) at 03/21/2018 0841 Last data filed at 03/20/2018 1900 Gross per 24 hour  Intake 1852.17 ml  Output 1900 ml  Net -47.83 ml   Filed Weights   03/16/18 1733 03/16/18 2226 03/19/18 1300  Weight: 89.9 kg (198 lb 3.1 oz) 86.9 kg (191 lb 9.3 oz) 88 kg (194 lb 0.1 oz)    Examination:  General: Pt is alert, awake, not in acute distress Cardiovascular: Irr Irr HR @ 86, S1/S2 +, no rubs, no gallops Respiratory: Decrease BS b/l no wheezing or crackles  Abdominal: Soft, NT, ND, bowel sounds +. PEG tube in place  Extremities: R knee, slight swollen and warm. No LE edema  Skin: No rashes   Data Reviewed: I have personally reviewed following labs and imaging studies  CBC: Recent Labs  Lab 03/16/18 1001 03/17/18 0447 03/18/18 0443 03/19/18 0412 03/20/18 0432 03/21/18 0419  WBC 4.0 4.0 3.7* 4.7 7.5 6.9  NEUTROABS 2.7  --  2.5 4.0 6.5 5.9  HGB 9.5* 8.4* 8.2* 8.4* 8.3* 8.7*  HCT 29.9* 26.3* 26.4* 26.8* 26.5* 28.0*  MCV 94.0 92.6 93.6 92.7 94.0 93.3  PLT 265 250 260 277 270 275   Basic Metabolic Panel: Recent Labs  Lab 03/17/18 0447 03/18/18 0443 03/19/18 0412 03/20/18 0432 03/21/18 0419  NA 138 139 138 143 141  K 4.1 3.5 3.2* 4.1 3.9  CL 93* 91* 88* 94* 98*  CO2 33* 38* 38* 38* 36*  GLUCOSE 153* 265* 328* 198* 214*  BUN 20 20 37* 52* 53*  CREATININE 0.88 0.89 0.87 0.90 0.77  CALCIUM 8.6* 8.4* 8.9 9.0 8.8*  MG  --  1.8 2.1 2.2 2.0  PHOS  --  4.6 2.8 3.2  --    GFR: Estimated Creatinine Clearance: 90.3 mL/min (by C-G formula based on SCr of 0.77 mg/dL). Liver Function Tests: Recent Labs  Lab 03/16/18 1001 03/18/18 0443 03/19/18 0412 03/20/18 0432 03/21/18 0419  AST 17 20 29  49* 51*  ALT 26 27 36 71* 96*  ALKPHOS 64 58 67 68 70  BILITOT 0.6 0.7 0.4 0.5 0.7  PROT 6.6 5.9* 6.3* 6.0* 5.8*  ALBUMIN 2.9* 2.4* 2.3* 2.3* 2.4*   No results for  input(s): LIPASE, AMYLASE in the last 168 hours. No results for input(s): AMMONIA in the last 168 hours. Coagulation Profile: No results for input(s): INR, PROTIME in the last 168 hours. Cardiac Enzymes: No results for input(s): CKTOTAL, CKMB, CKMBINDEX, TROPONINI in the last 168 hours. BNP (last 3 results) No results for input(s): PROBNP in the last 8760 hours. HbA1C: No results for input(s): HGBA1C in the last 72 hours. CBG: Recent Labs  Lab 03/20/18 1740 03/20/18 1953 03/20/18 2340 03/21/18 0407 03/21/18 0733  GLUCAP 291* 218* 166* 204* 165*   Lipid Profile: No results for input(s): CHOL, HDL, LDLCALC, TRIG, CHOLHDL, LDLDIRECT in the last 72 hours. Thyroid Function Tests: No results for input(s): TSH, T4TOTAL, FREET4, T3FREE, THYROIDAB in the last 72 hours. Anemia Panel: No results for input(s): VITAMINB12, FOLATE, FERRITIN, TIBC, IRON, RETICCTPCT in the last 72 hours. Sepsis Labs: Recent Labs  Lab 03/16/18 1034 03/16/18  2033 03/17/18 0447  LATICACIDVEN 2.33* 1.1 0.8    Recent Results (from the past 240 hour(s))  MRSA PCR Screening     Status: None   Collection Time: 03/17/18  6:55 AM  Result Value Ref Range Status   MRSA by PCR NEGATIVE NEGATIVE Final    Comment:        The GeneXpert MRSA Assay (FDA approved for NASAL specimens only), is one component of a comprehensive MRSA colonization surveillance program. It is not intended to diagnose MRSA infection nor to guide or monitor treatment for MRSA infections. Performed at [facility], [place] [address], [place], [place] [address]   Respiratory Panel by PCR     Status: None   Collection Time: 03/17/18  9:12 AM  Result Value Ref Range Status   Adenovirus NOT DETECTED NOT DETECTED Final   Coronavirus 229E NOT DETECTED NOT DETECTED Final   Coronavirus HKU1 NOT DETECTED NOT DETECTED Final   Coronavirus NL63 NOT DETECTED NOT DETECTED Final   Coronavirus OC43 NOT DETECTED NOT DETECTED Final    Metapneumovirus NOT DETECTED NOT DETECTED Final   Rhinovirus / Enterovirus NOT DETECTED NOT DETECTED Final   Influenza A NOT DETECTED NOT DETECTED Final   Influenza B NOT DETECTED NOT DETECTED Final   Parainfluenza Virus 1 NOT DETECTED NOT DETECTED Final   Parainfluenza Virus 2 NOT DETECTED NOT DETECTED Final   Parainfluenza Virus 3 NOT DETECTED NOT DETECTED Final   Parainfluenza Virus 4 NOT DETECTED NOT DETECTED Final   Respiratory Syncytial Virus NOT DETECTED NOT DETECTED Final   Bordetella pertussis NOT DETECTED NOT DETECTED Final   Chlamydophila pneumoniae NOT DETECTED NOT DETECTED Final   Mycoplasma pneumoniae NOT DETECTED NOT DETECTED Final    Comment: Performed at [facility], [place] [address] [place], [place] [address]  Body fluid culture     Status: None (Preliminary result)   Collection Time: 03/18/18  9:56 AM  Result Value Ref Range Status   Specimen Description   Final    KNEE Performed at [place] [address] [place], [place] [address]    Special Requests   Final    RIGHT Performed at [facility], [place] [address], [place], [place] [address]    Gram Stain   Final    RARE WBC PRESENT,BOTH PMN AND MONONUCLEAR NO ORGANISMS SEEN Performed at [facility], [place] [address], [place], [place] [address]    Culture NO GROWTH 2 DAYS  Final   Report Status PENDING  Incomplete  Gonococcus culture     Status: None   Collection Time: 03/18/18  9:58 AM  Result Value Ref Range Status   Specimen Description   Final    KNEE Performed at [place] [address], [place], [place] [address]    Special Requests   Final    RIGHT Performed at [facility], [place] [address] [place], [place] [address]    Culture   Final    NO GROWTH 2 DAYS Performed at [place] [address], [place], [place] [address]    Report Status 03/20/2018 FINAL  Final  Anaerobic culture     Status:  None (Preliminary result)   Collection Time: 03/18/18  9:58 AM  Result Value Ref Range Status   Specimen Description   Final    KNEE Performed at [place] [address] [place], [place] [address]    Special Requests  Final    RIGHT Performed at [facility], [place] [address], [place], [place] [address]    Culture   Final    NO ANAEROBES ISOLATED; CULTURE IN PROGRESS FOR 5 DAYS   Report Status PENDING  Incomplete     Radiology Studies: Dg Chest Port 1 View  Result Date: 03/20/2018 CLINICAL DATA:  Shortness of breath EXAM: PORTABLE CHEST 1 VIEW COMPARISON:  March 19, 2018 FINDINGS: Port-A-Cath tip is in the right atrium. No pneumothorax. There is left base atelectatic change. There is no edema or consolidation. Heart is upper normal in size with pulmonary vascularity normal. No adenopathy. No evident Rather lesions. IMPRESSION: Left base atelectasis. No edema or consolidation. Stable cardiac silhouette. No pneumothorax. Electronically Signed   By: Lowella Grip III M.D.   On: 03/20/2018 07:12      Scheduled Meds: . allopurinol  100 mg Oral Daily  . apixaban  5 mg Oral Q12H  . chlorhexidine  15 mL Mouth Rinse BID  . Chlorhexidine Gluconate Cloth  6 each Topical Daily  . colchicine  1.2 mg Oral Once   Followed by  . [START ON 03/22/2018] colchicine  0.6 mg Oral Daily  . diclofenac sodium  2 g Topical QID  . diltiazem  60 mg Oral Q6H  . feeding supplement (PRO-STAT SUGAR FREE 64)  30 mL Per Tube TID  . glipiZIDE  5 mg Oral QAC breakfast   And  . glipiZIDE  2.5 mg Oral Q supper  . insulin aspart  0-15 Units Subcutaneous Q4H  . insulin aspart  3 Units Subcutaneous Q4H  . ipratropium  0.5 mg Nebulization TID  . levalbuterol  0.63 mg Nebulization TID  . mouth rinse  15 mL Mouth Rinse q12n4p  . metFORMIN  1,000 mg Oral BID WC  . metoprolol tartrate  25 mg Oral BID  . nutrition supplement (JUVEN)  1 packet Per Tube BID BM  . polyethylene  glycol  17 g Oral Daily  . pravastatin  40 mg Oral QPM  . Propylene Glycol  1 drop Both Eyes BID  . scopolamine  1 patch Transdermal Q72H  . sodium chloride flush  10-40 mL Intracatheter Q12H  . vitamin B-12  500 mcg Oral BID   Continuous Infusions: . feeding supplement (JEVITY 1.5 CAL/FIBER) 1,000 mL (03/20/18 1440)     LOS: 5 days    Time spent: Total of 35 minutes spent with pt, greater than 50% of which was spent in discussion of  treatment, counseling and coordination of care   Chipper Oman, MD Pager: Text Page via [email or web address]   If 7PM-7AM, please contact night-coverage [email or web address] 03/21/2018, 8:41 AM   Note - This record has been created using [employer]. Chart creation errors have been sought, but may not always have been located. Such creation errors do not reflect on the standard of medical care.

## 2018-03-21 NOTE — NC FL2 (Signed)
[place] LEVEL OF CARE SCREENING TOOL     IDENTIFICATION  Patient Name: Edgar Herrera Birthdate: June 15, 1946 Sex: male Admission Date (Current Location): 03/16/2018  [facility] and [place] Number:  Herbalist and Address:  [facility],  [place] [address] [place]      Provider Number: [id number]  Attending Physician Name and Address:  Patrecia Pour, Christean Grief, MD  Relative Name and Phone Number:       Current Level of Care: Hospital Recommended Level of Care: [place] Prior Approval Number:    Date Approved/Denied:   PASRR Number:    Discharge Plan: SNF    Current Diagnoses: Patient Active Problem List   Diagnosis Date Noted  . Acute gouty arthritis 03/21/2018  . Lower extremity edema 03/16/2018  . Lactic acidosis 03/16/2018  . Left-sided weakness 03/16/2018  . PNA (pneumonia) 03/16/2018  . Anemia associated with chemotherapy 03/16/2018  . Weakness 03/16/2018  . Aspiration pneumonia ([place]) 03/03/2018  . Acute hypoxemic respiratory failure ([place]) 03/03/2018  . Pressure injury of skin 03/03/2018  . Throat cancer ([place])   . Dermatitis   . Neutropenic fever ([place])   . Sepsis ([place])   . HLD (hyperlipidemia) 02/21/2018  . T2DM (type 2 diabetes mellitus) ([place]) 02/21/2018  . Squamous cell carcinoma of base of tongue ([place]) 01/01/2018  . Atrial flutter ([place]) 05/14/2017  . Chronic anticoagulation 05/14/2017  . Essential hypertension 05/14/2017  . Paroxysmal atrial fibrillation ([place]) 05/13/2017    Orientation RESPIRATION BLADDER Height & Weight     Self, Time, Situation, Place  O2 Continent Weight: 194 lb 0.1 oz (88 kg) Height:  5\' 9"  (175.3 cm)  BEHAVIORAL SYMPTOMS/MOOD NEUROLOGICAL BOWEL NUTRITION STATUS      Continent Feeding tube  AMBULATORY STATUS COMMUNICATION OF NEEDS Skin   Extensive Assist Verbally PU Stage and Appropriate Care PU Stage 1 Dressing: (Left Buttocks ) PU Stage 2 Dressing: (Right buttocks)                   Personal Care Assistance Level of Assistance  Bathing, Feeding, Dressing Bathing Assistance: Limited assistance Feeding assistance: Independent Dressing Assistance: Limited assistance     Functional Limitations Info  Sight, Hearing, Speech Sight Info: Adequate Hearing Info: Adequate Speech Info: Adequate    SPECIAL CARE FACTORS FREQUENCY  PT (By licensed PT), OT (By licensed OT)     PT Frequency: 5x/week OT Frequency: 5x/week            Contractures Contractures Info: Not present    Additional Factors Info  Code Status, Allergies, Psychotropic, Insulin Sliding Scale Code Status Info: fullcode Allergies Info: Allergies: No Known Allergies   Insulin Sliding Scale Info: Yes, see med list       Current Medications (03/21/2018):  This is the current hospital active medication list Current Facility-Administered Medications  Medication Dose Route Frequency Provider Last Rate Last Dose  . allopurinol (ZYLOPRIM) tablet 100 mg  100 mg Oral Daily Shelly Coss, MD   100 mg at 03/21/18 0942  . amoxicillin-clavulanate (AUGMENTIN) 875-125 MG per tablet 1 tablet  1 tablet Oral Q12H Patrecia Pour, Christean Grief, MD   1 tablet at 03/21/18 [phone]  . apixaban (ELIQUIS) tablet 5 mg  5 mg Oral Q12H Shelly Coss, MD   5 mg at 03/21/18 0558  . bisacodyl (DULCOLAX) EC tablet 5 mg  5 mg Oral Daily PRN Patrecia Pour, Christean Grief, MD      . chlorhexidine (PERIDEX) 0.12 % solution 15 mL  15 mL Mouth Rinse BID Raiford Noble [place], DO   15 mL at 03/21/18 0177  . Chlorhexidine Gluconate Cloth 2 % PADS 6 each  6 each Topical Daily Raiford Noble [place], [place]   6 each at 03/21/18 1034  . [START ON 03/22/2018] colchicine tablet 0.6 mg  0.6 mg Oral Daily Patrecia Pour, Christean Grief, MD      . diclofenac sodium (VOLTAREN) 1 % transdermal gel 2 g  2 g Topical QID Sheikh, Omair [place], DO   2 g at 03/21/18 9390  . diltiazem (CARDIZEM) tablet 60 mg  60 mg Oral Q6H Wofford, Drew A, RPH   60 mg at 03/21/18 0558  . feeding  supplement (JEVITY 1.5 CAL/FIBER) liquid 1,000 mL  1,000 mL Per Tube Continuous Raiford Noble Latif, DO 50 mL/hr at 03/20/18 1440 1,000 mL at 03/20/18 1440  . feeding supplement (PRO-STAT SUGAR FREE 64) liquid 30 mL  30 mL Per Tube TID Raiford Noble Latif, DO   30 mL at 03/21/18 0943  . glipiZIDE (GLUCOTROL) tablet 5 mg  5 mg Oral QAC breakfast Patrecia Pour, Christean Grief, MD   5 mg at 03/21/18 0840   And  . glipiZIDE (GLUCOTROL) tablet 2.5 mg  2.5 mg Oral Q supper Patrecia Pour, Christean Grief, MD   2.5 mg at 03/20/18 1754  . guaiFENesin-dextromethorphan (ROBITUSSIN DM) 100-10 MG/5ML syrup 10 mL  10 mL Oral Q4H PRN Shelly Coss, MD   10 mL at 03/20/18 0901  . insulin aspart (novoLOG) injection 0-15 Units  0-15 Units Subcutaneous Q4H Raiford Noble [place], [place]   2 Units at 03/21/18 [phone]  . insulin aspart (novoLOG) injection 3 Units  3 Units Subcutaneous Q4H Raiford Noble [place], [place]   3 Units at 03/21/18 0415  . ipratropium (ATROVENT) nebulizer solution 0.5 mg  0.5 mg Nebulization TID Raiford Noble Latif, DO   0.5 mg at 03/21/18 0835  . levalbuterol (XOPENEX) nebulizer solution 0.63 mg  0.63 mg Nebulization TID Raiford Noble Latif, DO   0.63 mg at 03/21/18 0835  . levalbuterol (XOPENEX) nebulizer solution 0.63 mg  0.63 mg Nebulization Q4H PRN Raiford Noble Latif, DO      . MEDLINE mouth rinse  15 mL Mouth Rinse q12n4p Raiford Noble Latif, DO   15 mL at 03/19/18 1551  . metFORMIN (GLUCOPHAGE) tablet 1,000 mg  1,000 mg Oral BID WC Patrecia Pour, Christean Grief, MD   1,000 mg at 03/21/18 0840  . metoprolol tartrate (LOPRESSOR) tablet 25 mg  25 mg Oral BID Shelly Coss, MD   25 mg at 03/21/18 0942  . nutrition supplement (JUVEN) (JUVEN) powder packet 1 packet  1 packet Per Tube BID BM Raiford Noble [place], DO   1 packet at 03/21/18 [phone]  . polyethylene glycol (MIRALAX / GLYCOLAX) packet 17 g  17 g Oral Daily Patrecia Pour, Christean Grief, MD      . pravastatin (PRAVACHOL) tablet 40 mg  40 mg Oral QPM Shelly Coss, MD   40 mg at 03/20/18  1755  . Propylene Glycol 0.6 % SOLN 1 drop  1 drop Both Eyes BID Patrecia Pour, Christean Grief, MD   1 drop at 03/21/18 1035  . scopolamine (TRANSDERM-SCOP) 1 MG/3DAYS 1.5 mg  1 patch Transdermal Q72H Shelly Coss, MD   1.5 mg at 03/19/18 2153  . sodium chloride flush (NS) 0.9 % injection 10-40 mL  10-40 mL Intracatheter Q12H Sheikh, Georgina Quint [place], DO   40 mL at 03/21/18 0959  . sodium chloride flush (NS) 0.9 % injection 10-40 mL  10-40  mL Intracatheter PRN Raiford Noble [place], DO   10 mL at 03/19/18 2156  . traMADol (ULTRAM) tablet 50 mg  50 mg Per Tube Q12H PRN Patrecia Pour, Christean Grief, MD      . vitamin B-12 (CYANOCOBALAMIN) tablet 500 mcg  500 mcg Oral BID Shelly Coss, MD   500 mcg at 03/21/18 7408     Discharge Medications: Please see discharge summary for a list of discharge medications.  Relevant Imaging Results:  Relevant Lab Results:   Additional Information ssn:[id number]  Lia Hopping, LCSW

## 2018-03-21 NOTE — Clinical Social Work Note (Signed)
Clinical Social Work Assessment  Patient Details  Name: Edgar Herrera MRN: [id number] Date of Birth: Jan 31, 1946  Date of referral:  03/21/18               Reason for consult:  Facility Placement                Permission sought to share information with:  Family Supports, Chartered certified accountant granted to share information::  Yes, Verbal Permission Granted  Name::        Agency::  SNF  Relationship::   Spouse  Contact Information:     Housing/Transportation Living arrangements for the past 2 months:  Single Family Home Source of Information:  Patient, Spouse Patient Interpreter Needed:  None Criminal Activity/Legal Involvement Pertinent to Current Situation/Hospitalization:  No - Comment as needed Significant Relationships:   Spouse, Friend Lives with:  Spouse Do you feel safe going back to the place where you live?  Yes Need for family participation in patient care:  Yes (Comment)  Care giving concerns:   Increased weakness, inability to ambulate, lower extremity edema Patient has PEG tube placed.   Social Worker assessment / plan: CSW met with the patient at bedside, explain role and reason for visit-to assist with locating a SNF for rehab. Patient spouse reports the patient was fairly independent and was recently "playing golf with his friends." She reports the patient has pain in legs and was unable to stand without support. Patient spouse reports this is the first time the patient has been to a facility.  CSW explain SNF process and later following up with bed offers.  Patient will need insurance authorization prior to discharge.   Plan: Assist with discharge to SNF.   Employment status:  Retired Nurse, adult PT Recommendations:  [place] / Referral to community resources:  [place]  Patient/Family's Response to care: Agreeable and Responding well to care.   Patient/Family's  Understanding of and Emotional Response to Diagnosis, Current Treatment, and Prognosis:  Patient has a good understanding of his diagnosis and treatment . Patient spouse also very involved in patient care. Patient and spouse are hopeful the patient will regain his strength quickly and return to engaging his favorite activities.  Emotional Assessment Appearance:  Appears stated age Attitude/Demeanor/Rapport:    Affect (typically observed):  Accepting Orientation:  Oriented to Self, Oriented to Place, Oriented to  Time, Oriented to Situation Alcohol / Substance use:  Not Applicable Psych involvement (Current and /or in the community):  No (Comment)  Discharge Needs  Concerns to be addressed:  Discharge Planning Concerns Readmission within the last 30 days:  Yes Current discharge risk:  Dependent with Mobility Barriers to Discharge:  Ship broker, Continued Medical Work up   [employer], [employer] 03/21/2018, 11:35 AM

## 2018-03-21 NOTE — Progress Notes (Signed)
Physical Therapy Treatment Patient Details Name: Edgar Herrera MRN: [id number] DOB: [birth year] Today's Date: 03/21/2018    History of Present Illness 72 yo male admitted with LE weakness, inability to walk and LE edema.  Just d/c home 5/31 after admission for respiratory failure, was receiving HHPT. Hx of head/neck cancer, lymphedema, vertigo, gout, Afib, pressure injury, prostate cancer.     PT Comments    Assisted OOB to amb using B platform EVA walker for increased support.  General transfer comment: Max Assist + 2 side by side with partial upright stance of B hips and knees flexed.  Briefly stoof long enough to pull up shorts then quickly sat back down on bed due to weakness/fatigue.  General Gait Details: used EVA walker for increased support and to increase amb distance while spouse followed with recliner.  Poor flexed posture and weakness throughout.  R Gout pain improved.   Follow Up Recommendations  SNF;Supervision/Assistance - 24 hour     Equipment Recommendations  3in1 (PT)    Recommendations for Other Services       Precautions / Restrictions      Mobility  Bed Mobility Overal bed mobility: Needs Assistance Bed Mobility: Supine to Sit     Supine to sit: Min assist     General bed mobility comments: increased ability to self perform and tolerate sitting EOB  Transfers Overall transfer level: Needs assistance Equipment used: Rolling walker (2 wheeled) Transfers: Sit to/from Stand Sit to Stand: Max assist;+2 physical assistance;+2 safety/equipment         General transfer comment: Max Assist + 2 side by side with partial upright stance of B hips and knees flexed.  Briefly stoof long enough to pull up shorts then quickly sat back down on bed due to weakness/fatigue.  Ambulation/Gait Ambulation/Gait assistance: Min assist;+2 physical assistance;+2 safety/equipment Gait Distance (Feet): 8 Feet Assistive device: Bilateral platform walker(EVA WALKER) Gait  Pattern/deviations: Step-to pattern;Step-through pattern;Decreased stance time - right;Trunk flexed;Narrow base of support Gait velocity: decreased   General Gait Details: used EVA walker for increased support and to increase amb distance while spouse followed with recliner.  Poor flexed posture and weakness throughout.     Stairs             Wheelchair Mobility    Modified Rankin (Stroke Patients Only)       Balance                                            Cognition Arousal/Alertness: Awake/alert Behavior During Therapy: WFL for tasks assessed/performed Overall Cognitive Status: Within Functional Limits for tasks assessed                                 General Comments: AxO x 4 pleasant      Exercises      General Comments        Pertinent Vitals/Pain Pain Assessment: Faces Faces Pain Scale: Hurts little more Pain Location: R knee Gout Pain Descriptors / Indicators: Grimacing;Aching Pain Intervention(s): Monitored during session;Repositioned    Home Living                      Prior Function            PT Goals (current goals can now be found in the care  plan section) Progress towards PT goals: Progressing toward goals    Frequency    Min 3X/week      PT Plan Current plan remains appropriate    Co-evaluation              AM-PAC PT "6 Clicks" Daily Activity  Outcome Measure  Difficulty turning over in bed (including adjusting bedclothes, sheets and blankets)?: A Lot Difficulty moving from lying on back to sitting on the side of the bed? : A Lot Difficulty sitting down on and standing up from a chair with arms (e.g., wheelchair, bedside commode, etc,.)?: A Lot Help needed moving to and from a bed to chair (including a wheelchair)?: A Lot Help needed walking in hospital room?: A Lot Help needed climbing 3-5 steps with a railing? : Total 6 Click Score: 11    End of Session Equipment  Utilized During Treatment: Gait belt Activity Tolerance: Patient limited by fatigue;Patient limited by pain Patient left: with call bell/phone within reach;with family/visitor present;in chair Nurse Communication: Mobility status;Need for lift equipment PT Visit Diagnosis: Muscle weakness (generalized) (M62.81);Other abnormalities of gait and mobility (R26.89);Pain Pain - part of body: Knee     Time: 1140-1220 PT Time Calculation (min) (ACUTE ONLY): 40 min  Charges:  $Gait Training: 8-22 mins $Therapeutic Activity: 23-37 mins                    G Codes:       Rica Koyanagi  PTA [facility]  Acute  Rehab Pager      [phone]'

## 2018-03-21 NOTE — Progress Notes (Signed)
Nutrition Follow-up  DOCUMENTATION CODES:   Not applicable  INTERVENTION:  - Continue current TF regimen. - If free water flush desired, to be per MD given previous BLE edema.    NUTRITION DIAGNOSIS:   Increased nutrient needs related to cancer and cancer related treatments as evidenced by estimated needs. -ongoing  GOAL:   Patient will meet greater than or equal to 90% of their needs -met with TF regimen  MONITOR:   Labs, Weight trends, TF tolerance, Skin, I & O's  ASSESSMENT:   72 y.o. male with medical history significant of paroxysmal A. fib, diabetes type 2, hyperlipidemia, hypertension, squamous cell carcinoma base of tongue, status post PEG tube placement who presents to the emergency department with complaints of inability to walk, increased weakness mainly in the left side and severe bilateral lower extremity edema.  Weight trending down since admission (-4 lbs/1.9 kg). Pt is NPO and PEG is in place. He is receiving Jevity 1.5 @ 50 mL/hr with 30 mL Prostat TID and 1 packet Juven BID. This regimen is providing 2260 kcal (98% estimated kcal need), 121 grams of protein, 28 grams of amino acids, and 917 mL free water.   Per Dr. Elza Rafter note yesterday: BLE edema and weakness with edema significantly improved following Lasix, L-sided PNA with questionable aspiration, respiratory viral panel negative, squamous cell carcinoma at the base of tongue. As of yesterday, stated to SNF at d/c, possibly in 2-3 days from that time.   Medications reviewed; 5 mg Glucotrol/day, 2.5 mg Glucotrol/day, sliding scale Novolog, 3 units Novolog every 4 hours, 1000 mg Metformun BID, 1 packet Miralax/day, and 500 mcg oral vitamin B12 BID. Labs reviewed; CBGs: 204 and 165 mg/dL, Cl: 98 mmol/L, BUN: 53 mg/dL, Ca: 8.8 mg/dL.    Diet Order:   Diet Order    None      EDUCATION NEEDS:   Education needs have been addressed  Skin:  Skin Integrity Issues:: Stage I Stage I: L buttocks Stage II: R  buttocks  Last BM:  6/12  Height:   Ht Readings from Last 1 Encounters:  03/16/18 5' 9"  (1.753 m)    Weight:   Wt Readings from Last 1 Encounters:  03/19/18 194 lb 0.1 oz (88 kg)    Ideal Body Weight:  72.72 kg  BMI:  Body mass index is 28.65 kg/m.  Estimated Nutritional Needs:   Kcal:  7106-2694  Protein:  115-130g  Fluid:  2.4L/day     Jarome Matin, MS, RD, LDN, CNSC Inpatient Clinical Dietitian Pager # [phone] After hours/weekend pager # [phone]

## 2018-03-21 NOTE — Progress Notes (Signed)
Oncology Nurse Navigator Documentation  Visited Edgar Her[id number] to check on his well-being.  His wife and 2 friends were bedside. He reported feeling much better since my last visit.  Wife indicated he is now able to sit on side of bed which is progress since earlier this week. Plan is for him to be DC'd to rehab in [place].  Wife hopes for tomorrow. I encouraged them to contact me needs/concerns; they voiced appreciation.  Gayleen Orem, RN, BSN Head & Neck Oncology Nurse [place] [facility] [place] [phone]

## 2018-03-22 ENCOUNTER — Ambulatory Visit
Admit: 2018-03-22 | Discharge: 2018-03-22 | Disposition: A | Discharge status: Home / Self Care | Payer: Medicare Other | Attending: Radiation Oncology | Admitting: Radiation Oncology

## 2018-03-22 HISTORY — DX: Personal history of irradiation: Z92.3

## 2018-03-22 LAB — GLUCOSE, CAPILLARY
GLUCOSE-CAPILLARY: 183 mg/dL — AB (ref 65–99)
Glucose-Capillary: 253 mg/dL — ABNORMAL HIGH (ref 65–99)
Glucose-Capillary: 214 mg/dL — ABNORMAL HIGH (ref 65–99)
Glucose-Capillary: 186 mg/dL — ABNORMAL HIGH (ref 65–99)

## 2018-03-22 MED ORDER — JEVITY 1.5 CAL/FIBER PO LIQD: 1000.0000 mL | ORAL | Status: AC

## 2018-03-22 MED ORDER — DILTIAZEM HCL 60 MG PO TABS: 60.0000 mg | ORAL_TABLET | Freq: Four times a day (QID) | ORAL | Status: AC

## 2018-03-22 MED ORDER — COLCHICINE 0.6 MG PO TABS: 0.6000 mg | ORAL_TABLET | Freq: Every day | ORAL | Status: AC

## 2018-03-22 MED ORDER — PREDNISONE 20 MG PO TABS: 40.0000 mg | ORAL_TABLET | Freq: Every day | ORAL | 0 refills | Status: AC

## 2018-03-22 MED ORDER — HEPARIN SOD (PORK) LOCK FLUSH 100 UNIT/ML IV SOLN
500.0000 [IU] | INTRAVENOUS | Status: AC | PRN
  Administered 2018-03-22: 500 [IU]

## 2018-03-22 MED ORDER — AMOXICILLIN-POT CLAVULANATE 875-125 MG PO TABS: 1.0000 | ORAL_TABLET | Freq: Two times a day (BID) | ORAL | 0 refills | Status: AC

## 2018-03-22 MED ORDER — BISACODYL 5 MG PO TBEC: 5.0000 mg | DELAYED_RELEASE_TABLET | Freq: Every day | ORAL | 0 refills | Status: AC | PRN

## 2018-03-22 MED ORDER — TRAMADOL HCL 50 MG PO TABS: 25.0000 mg | ORAL_TABLET | Freq: Two times a day (BID) | ORAL | 0 refills | Status: AC | PRN

## 2018-03-22 MED ORDER — PRO-STAT SUGAR FREE PO LIQD: 30.0000 mL | Freq: Three times a day (TID) | ORAL | 0 refills | Status: DC

## 2018-03-22 MED ORDER — GUAIFENESIN-DM 100-10 MG/5ML PO SYRP: 10.0000 mL | ORAL_SOLUTION | ORAL | 0 refills | Status: AC | PRN

## 2018-03-22 NOTE — Discharge Summary (Signed)
Physician Discharge Summary  Adarius Tigges  WCH:[id number]  DOB: 11-25-1945  DOA: 03/16/2018 PCP: [facility]  Admit date: 03/16/2018 Discharge date: 03/22/2018  Admitted From: Home  Disposition: SNF   Recommendations for Outpatient Follow-up:  1. Follow up with SNF provider at earliest convenience 2. Please obtain CMP/CBC in one week to monitor liver function, renal function and hemoglobin 3. Speech therapy for swallow 4. 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  Upon ED evaluation patient was hemodynamically stable, chest x-ray was questionable for pneumonia and patient was admitted for empiric antibiotics.  Patient subsequently developed A. fib and was placed on Cardizem drip.  Orthopedic was consulted due to bilateral knee swelling.  Underwent bilateral knee aspiration and bilateral knee steroid injection.  Subjective: Patient seen and examined, he continues to do well.  Has no complaints.  Heart rate is well controlled.  Cough has disappeared.  Denies chest pain, shortness of breath and palpitation.  Right knee significantly improved after colchicine, however still tender.  No acute events overnight, remains afebrile.  Discharge Diagnoses/Hospital Course:  Bilateral lower extremity edema complicated with weakness - improved Multifactorial, likely from diastolic dysfunction, venous insufficiency and gout Swelling significantly improved, was treated with IV Lasix and leg elevation. Lower extremity duplex negative for  DVT Continue TED hose and leg elevation   B/L Osteoarthritis/R knee Acute Gout  Orthopedics was consulted and s/p knee aspiration and steroid injection Fluid analysis with + for crystals, started on colchicine, will continue prophylactics dose, will start prednisone for 3 days to help with inflammation process. Cultures from knee aspiration negative so far PT recommending SNF  A. fib with RVR - Improved  Initially treated with  Cardizem drip, heart rate is well controlled now, he is currently on A. fib. Continue Cardizem 60 mg QID and metoprolol 25 mg BID Continue Eliquis for anticoagulation Aloe up with cardiology advised  Diabetes mellitus type 2 CBGs acceptable Patient was treated with insulin during hospital stay, resume metformin and glipizide adjust medication pending CBGs.   Left side, persistent pneumonia - improved Chest x-ray could not rule out already pneumonia in the left base. Patient on Zosyn given immunocompromise status, MRSA negative, vancomycin d/ced.  Zosyn d/ced, continue Augmentin for 3 more days Patient with increased oropharyngeal secretion ?  Aspiration.  Continue supportive treatment and Robitussin as needed Respiratory viral panel negative.  Anemia of chronic disease/malignancy/chemotherapy Hemoglobin remains stable No signs of overt bleeding Continue to monitor  Hypertension BP stable At this time and metoprolol  Squamous cell carcinoma of the base of the tongue Status post PEG tube Oncology follow-up as an outpatient Completed chemotherapy and radiation Some oropharyngeal secretions, scopolamine patch was started.  All other chronic medical condition were stable during the hospitalization.  Patient was seen by physical therapy, recommending SNF On the day of the discharge the patient's vitals were stable, and no other acute medical condition were reported by patient. the patient was felt safe to be discharge to SNF.   Discharge  Instructions  You were cared for by a hospitalist during your hospital stay. If you have any questions  about your discharge medications or the care you received while you were in the hospital after you are discharged, you can call the unit and asked to speak with the hospitalist on call if the hospitalist that took care of you is not available. Once you are discharged, your primary care physician will handle any further medical issues. Please note that NO REFILLS for any discharge medications will be authorized once you are discharged, as it is imperative that you return to your primary care physician (or establish a relationship with a primary care physician if you do not have one) for your aftercare needs so that they can reassess your need for medications and monitor your lab values.  Discharge Instructions    Call MD for:  difficulty breathing, headache or visual disturbances   Complete by:  As directed    Call MD for:  extreme fatigue   Complete by:  As directed    Call MD for:  hives   Complete by:  As directed    Call MD for:  persistant dizziness or light-headedness   Complete by:  As directed    Call MD for:  persistant nausea and vomiting   Complete by:  As directed    Call MD for:  redness, tenderness, or signs of infection (pain, swelling, redness, odor or green/yellow discharge around incision site)   Complete by:  As directed    Call MD for:  severe uncontrolled pain   Complete by:  As directed    Call MD for:  temperature >100.4   Complete by:  As directed    Diet - low sodium heart healthy   Complete by:  As directed    Increase activity slowly   Complete by:  As directed      Allergies as of 03/22/2018   No Known Allergies     Medication List    STOP taking these medications   dexamethasone 4 MG tablet Commonly known as:  DECADRON   lidocaine-prilocaine cream Commonly known as:  EMLA   LORazepam 0.5 MG tablet Commonly known as:  ATIVAN     TAKE these  medications   acetaminophen 500 MG tablet Commonly known as:  TYLENOL Take 1,000 mg by mouth every 6 (six) hours as needed for fever.   allopurinol 100 MG tablet Commonly known as:  ZYLOPRIM Take 100 mg by mouth daily.   amoxicillin-clavulanate 875-125 MG tablet Commonly known as:  AUGMENTIN Take 1 tablet by mouth every 12 (twelve) hours for 3 days.   apixaban 5 MG Tabs tablet Commonly known as:  ELIQUIS Take 5 mg by mouth 2 (two) times daily.   bisacodyl 5 MG EC tablet Commonly known as:  DULCOLAX Take 1 tablet (5 mg total) by mouth daily as needed for moderate constipation.   colchicine 0.6 MG tablet Take 1 tablet (0.6 mg total) by mouth daily. Start taking on:  03/23/2018   diltiazem 60 MG tablet Commonly known as:  CARDIZEM Take 1 tablet (60 mg total) by mouth every 6 (six) hours.   feeding supplement (JEVITY 1.5 CAL/FIBER) Liqd Place 1,000 mLs into feeding tube continuous.   feeding supplement (PRO-STAT SUGAR FREE 64) Liqd Place 30 mLs into feeding tube 3 (three) times daily.   glipiZIDE 5 MG tablet Commonly known as:  GLUCOTROL Take 2.5-5 mg by mouth See admin instructions. Take 1 tablet before breakfast and Take 1/2 tablet before dinner.   guaiFENesin-dextromethorphan 100-10 MG/5ML syrup Commonly known as:  ROBITUSSIN DM Take 10 mLs  by mouth every 4 (four) hours as needed for cough.   metFORMIN 1000 MG tablet Commonly known as:  GLUCOPHAGE Take 1,000 mg by mouth 2 (two) times daily with a meal.   metoprolol tartrate 25 MG tablet Commonly known as:  LOPRESSOR Place 1 tablet (25 mg total) into feeding tube 2 (two) times daily.   pravastatin 40 MG tablet Commonly known as:  PRAVACHOL Take 40 mg by mouth every evening.   predniSONE 20 MG tablet Commonly known as:  DELTASONE Take 2 tablets (40 mg total) by mouth daily with breakfast for 3 days.   scopolamine 1 MG/3DAYS Commonly known as:  TRANSDERM-SCOP Place 1 patch (1.5 mg total) onto the skin every 3  (three) days.   sodium fluoride 1.1 % Gel dental gel Commonly known as:  FLUORISHIELD Instill one drop of gel per tooth space of fluoride tray. Place over teeth for 5 minutes. Remove. Spit out excess. Repeat nightly. What changed:    how much to take  how to take this  when to take this  additional instructions   SYSTANE CONTACTS Soln Place 1 drop into both eyes daily.   traMADol 50 MG tablet Commonly known as:  ULTRAM Place 0.5 tablets (25 mg total) into feeding tube every 12 (twelve) hours as needed for moderate pain.   vitamin B-12 500 MCG tablet Commonly known as:  CYANOCOBALAMIN Take 500 mcg by mouth 2 (two) times daily.       No Known Allergies  Consultations:  Orthopedic surgery    Procedures/Studies: Dg Chest 2 View  Result Date: 03/16/2018 CLINICAL DATA:  Cough for 2 months.  History of throat carcinoma EXAM: CHEST - 2 VIEW COMPARISON:  Mar 08, 2018 FINDINGS: There is a small left pleural effusion with left base atelectatic change. Right lung is clear. Heart is upper normal in size with pulmonary vascularity normal. No adenopathy. Port-A-Cath tip is cavoatrial junction. No pneumothorax. There is degenerative change in the thoracic spine. IMPRESSION: Small left pleural effusion with left base atelectasis. Early pneumonia in the left base cannot be excluded. Lungs elsewhere clear. Stable cardiac silhouette. Port-A-Cath tip at cavoatrial junction. Electronically Signed   By: Lowella Grip III M.D.   On: 03/16/2018 10:32   Ct Head Wo Contrast  Result Date: 03/16/2018 CLINICAL DATA:  Lower extremity weakness. History of throat carcinoma EXAM: CT HEAD WITHOUT CONTRAST TECHNIQUE: Contiguous axial images were obtained from the base of the skull through the vertex without intravenous contrast. COMPARISON:  None. FINDINGS: Brain: There is moderate diffuse atrophy. There is no intracranial mass, hemorrhage, extra-axial fluid collection, or midline shift. There is patchy  small vessel disease throughout the centra semiovale bilaterally. Elsewhere gray-white compartments appear normal. No evident acute infarct. Vascular: No hyperdense vessels. There is calcification in each carotid siphon as well as in the distal vertebral arteries. Skull: Bony calvarium appears intact. Sinuses/Orbits: There is a small retention cyst in the anterior left maxillary antrum. There is mucosal thickening in several ethmoid air cells. There is opacification in a somewhat hypoplastic left frontal sinus. Orbits appear symmetric bilaterally. Other: There is opacification multiple mastoid air cells bilaterally. IMPRESSION: Atrophy with patchy periventricular small vessel disease. No evident acute infarct. No mass or hemorrhage. There are foci of arterial vascular calcification. Areas of paranasal sinus and mastoid disease noted. Electronically Signed   By: Lowella Grip III M.D.   On: 03/16/2018 10:02   Ct Soft Tissue Neck W Contrast  Result Date: 02/25/2018 CLINICAL DATA:  Head neck  squamous cell carcinoma. Difficulty swallowing. EXAM: CT NECK WITH CONTRAST TECHNIQUE: Multidetector CT imaging of the neck was performed using the standard protocol following the bolus administration of intravenous contrast. CONTRAST:  10mL OMNIPAQUE IOHEXOL 300 MG/ML  SOLN COMPARISON:  CT neck 11/29/2017 FINDINGS: PHARYNX AND LARYNX: --Nasopharynx: Fossae of Rosenmuller are clear. Normal adenoid tonsils for age. --Oral cavity and oropharynx: The palatine and lingual tonsils are normal. The visible oral cavity and floor of mouth are normal. --Hypopharynx: Previously demonstrated mass within the left vallecula and encroaching on the left parapharyngeal space is no longer present. There is no residual mass visualized. --Larynx: Normal epiglottis and pre-epiglottic space. Normal aryepiglottic and vocal folds. --Retropharyngeal space: No abscess, effusion or lymphadenopathy. SALIVARY GLANDS: --Parotid: No mass lesion or  inflammation. No sialolithiasis or ductal dilatation. --Submandibular: Symmetric without inflammation. No sialolithiasis or ductal dilatation. --Sublingual: Normal. No ranula or other visible lesion of the base of tongue and floor of mouth. THYROID: Normal. LYMPH NODES: Hypodense left level 2 A node measures 9 mm. A slightly lower left level 2A node measures 10 mm. These nodes have decreased in size. Unchanged appearance of subcentimeter left level 3 nodes. No right-sided adenopathy. VASCULAR: Calcific atherosclerosis of both carotid bifurcations without high-grade stenosis. LIMITED INTRACRANIAL: Normal. VISUALIZED ORBITS: Normal. MASTOIDS AND VISUALIZED PARANASAL SINUSES: No fluid levels or advanced mucosal thickening. No mastoid effusion. SKELETON: No bony spinal canal stenosis. No lytic or blastic lesions. UPPER CHEST: Clear. OTHER: None. IMPRESSION: 1. No residual pharyngeal mass is visible. No specific finding to account for the reported dysphagia. 2. Decreased size of left level 2 A cervical lymph nodes, likely indicating response to therapy. Electronically Signed   By: Ulyses Jarred M.D.   On: 02/25/2018 17:35   Ct Angio Chest Pe W And/or Wo Contrast  Result Date: 03/03/2018 CLINICAL DATA:  Hypoxia. EXAM: CT ANGIOGRAPHY CHEST WITH CONTRAST TECHNIQUE: Multidetector CT imaging of the chest was performed using the standard protocol during bolus administration of intravenous contrast. Multiplanar CT image reconstructions and MIPs were obtained to evaluate the vascular anatomy. CONTRAST:  23mL ISOVUE-370 IOPAMIDOL (ISOVUE-370) INJECTION 76% COMPARISON:  Radiograph of same day. FINDINGS: Cardiovascular: Satisfactory opacification of the pulmonary arteries to the segmental level. No evidence of pulmonary embolism. Normal heart size. No pericardial effusion. Mediastinum/Nodes: No enlarged mediastinal, hilar, or axillary lymph nodes. Thyroid gland, trachea, and esophagus demonstrate no significant findings.  Lungs/Pleura: No pneumothorax or pleural effusion is noted. Mild left basilar atelectasis is noted. Mild atelectasis or possibly pneumonia is noted in superior segment of right lower lobe. Mild right upper lobe subsegmental atelectasis or inflammation is noted. Upper Abdomen: Large solitary gallstone is noted without inflammation. Gastrostomy tube is in grossly good position. Musculoskeletal: No chest wall abnormality. No acute or significant osseous findings. Review of the MIP images confirms the above findings. IMPRESSION: No definite evidence of pulmonary embolus. Atelectasis or pneumonia is noted in superior segment of right lower lobe. Mild right upper lobe subsegmental atelectasis or pneumonia is noted. Mild left basilar subsegmental atelectasis is noted. Large solitary gallstone is noted. Gastrostomy tube in grossly good position. Electronically Signed   By: Marijo Conception, M.D.   On: 03/03/2018 09:17   Mr Jeri Cos KG Contrast  Result Date: 03/16/2018 CLINICAL DATA:  73 year old male with bilateral lower extremity pain, swelling, and weakness for 2 days. EXAM: MRI HEAD WITHOUT AND WITH CONTRAST TECHNIQUE: Multiplanar, multiecho pulse sequences of the brain and surrounding structures were obtained without and with intravenous contrast. CONTRAST:  18mL  MULTIHANCE GADOBENATE DIMEGLUMINE 529 MG/ML IV SOLN COMPARISON:  Head CT without contrast 03/16/2018. CT neck 02/25/2018. FINDINGS: Brain: No restricted diffusion to suggest acute infarction. No midline shift, mass effect, evidence of mass lesion, ventriculomegaly, extra-axial collection or acute intracranial hemorrhage. Cervicomedullary junction and pituitary are within normal limits. Cerebral volume is within normal limits for age. Patchy bilateral moderate for age cerebral white matter T2 and FLAIR hyperintensity in a nonspecific configuration. Similar patchy T2 hyperintensity in the pons. No cortical encephalomalacia or chronic cerebral blood products  identified. Probable small perivascular space in the right thalamus (normal variant). The other deep gray matter nuclei and the cerebellum appear normal. No abnormal enhancement identified. No dural thickening Vascular: Major intracranial vascular flow voids are preserved. Generalized intracranial artery tortuosity. The major dural venous sinuses are enhancing and appear patent. Skull and upper cervical spine: Negative visible cervical spine. Normal Rothery marrow signal. Sinuses/Orbits: Normal orbits soft tissues. Paranasal sinuses are clear. Other: Small volume of layering secretions in the visible pharynx. There are bilateral mastoid air cell effusions. Visible internal auditory structures otherwise appear normal. Scalp and face soft tissues appear negative. IMPRESSION: 1.  No acute intracranial abnormality. 2. Moderate for age signal changes in the cerebral white matter and pons, nonspecific but most commonly due to chronic small vessel disease. 3. Retained secretions in the pharynx with bilateral mastoid air cell effusions. Electronically Signed   By: Genevie Ann M.D.   On: 03/16/2018 16:25   Dg Chest Port 1 View  Result Date: 03/20/2018 CLINICAL DATA:  Shortness of breath EXAM: PORTABLE CHEST 1 VIEW COMPARISON:  March 19, 2018 FINDINGS: Port-A-Cath tip is in the right atrium. No pneumothorax. There is left base atelectatic change. There is no edema or consolidation. Heart is upper normal in size with pulmonary vascularity normal. No adenopathy. No evident Salvador lesions. IMPRESSION: Left base atelectasis. No edema or consolidation. Stable cardiac silhouette. No pneumothorax. Electronically Signed   By: Lowella Grip III M.D.   On: 03/20/2018 07:12   Dg Chest Port 1 View  Result Date: 03/19/2018 CLINICAL DATA:  Shortness of Breath EXAM: PORTABLE CHEST 1 VIEW COMPARISON:  03/18/2018 FINDINGS: Cardiac shadow is stable. Left-sided chest wall port is noted. The overall inspiratory effort remains poor with some  left basilar atelectatic changes no new focal abnormality is noted. IMPRESSION: Stable appearance of the chest. Electronically Signed   By: Inez Catalina M.D.   On: 03/19/2018 08:50   Dg Chest Port 1 View  Result Date: 03/18/2018 CLINICAL DATA:  Shortness of breath EXAM: PORTABLE CHEST 1 VIEW COMPARISON:  Chest x-ray of 03/16/2018 FINDINGS: The lungs again are poorly aerated with persistent opacity at the left lung base some of which represents atelectasis with apparent elevation of the left hemidiaphragm. Left subpulmonic effusion cannot be excluded. Heart size is stable. Port-A-Cath remains. IMPRESSION: Poor aeration with persistent opacity at the left lung base. Electronically Signed   By: Ivar Drape M.D.   On: 03/18/2018 08:44   Dg Chest Port 1 View  Result Date: 03/08/2018 CLINICAL DATA:  Dyspnea. EXAM: PORTABLE CHEST 1 VIEW COMPARISON:  One-view chest x-ray 03/04/2018 FINDINGS: The heart size is exaggerated by low lung volumes. A left subclavian Port-A-Cath is in place. Lung volumes are low. Progressive left lower lobe airspace disease is present. There is no edema or effusion. IMPRESSION: 1. Developing lingular or left lower lobe airspace disease is concerning for infection or aspiration. 2. Low lung volumes. 3. Left subclavian Port-A-Cath. Electronically Signed   By:  San Morelle M.D.   On: 03/08/2018 07:27   Dg Chest Port 1 View  Result Date: 03/04/2018 CLINICAL DATA:  Shortness of breath EXAM: PORTABLE CHEST 1 VIEW COMPARISON:  03/03/2018 FINDINGS: Shallow inspiration. Cardiac enlargement. No vascular congestion, edema, or consolidation. No blunting of costophrenic angles. No pneumothorax. Power port type central venous catheter with tip over the cavoatrial junction region. Tortuous aorta. IMPRESSION: Shallow inspiration. Cardiac enlargement. No active pulmonary disease. Electronically Signed   By: Lucienne Capers M.D.   On: 03/04/2018 03:25   Dg Chest Port 1 View  Result Date:  03/03/2018 CLINICAL DATA:  Fevers. EXAM: PORTABLE CHEST 1 VIEW COMPARISON:  02/21/2018 FINDINGS: Left chest wall port a catheter is noted with tip in the right atrium. Unchanged. There is cardiac enlargement. No pleural effusion or edema. No airspace opacities. IMPRESSION: 1. No acute cardiopulmonary abnormalities. Electronically Signed   By: Kerby Moors M.D.   On: 03/03/2018 07:49   Dg Chest Portable 1 View  Result Date: 02/21/2018 CLINICAL DATA:  Throat cancer.  Coughing up red phlegm. EXAM: PORTABLE CHEST 1 VIEW COMPARISON:  Outside PET-CT 12/24/2017. FINDINGS: Left subclavian PowerPort catheter noted with tip in the right atrium. Cardiomegaly with normal pulmonary vascularity. No focal infiltrate. No pleural effusion pneumothorax. Mild elevation left hemidiaphragm. No acute bony abnormality. Mediastinum and hilar structures are normal. IMPRESSION: One PowerPort catheter noted with tip over the right atrium. 2.  Cardiomegaly.  No pulmonary venous congestion. 3.  No acute pulmonary disease. Electronically Signed   By: Marcello Moores  Register   On: 02/21/2018 07:41   Dg Knee Complete 4 Views Left  Result Date: 03/17/2018 CLINICAL DATA:  Soft tissue swelling EXAM: LEFT KNEE - COMPLETE 4+ VIEW COMPARISON:  None. FINDINGS: Frontal, lateral, and bilateral oblique views were obtained. There is no appreciable fracture or dislocation. There is a large joint effusion. There is moderate narrowing of the lateral compartment and patellofemoral joint with slight narrowing medially. There is spurring in all compartments. There is bony overgrowth along the inferior patella. No erosive changes. IMPRESSION: Moderate osteoarthritic change. Large joint effusion. 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 No fracture or dislocation. Electronically Signed   By: Lowella Grip III M.D.   On: 03/17/2018 11:42    Discharge Exam: Vitals:   03/22/18 0448 03/22/18 0819  BP: 129/77   Pulse: 76    Resp: 20   Temp: (!) 97.4 F (36.3 C)   SpO2: 98% 98%   Vitals:   03/21/18 2034 03/21/18 2102 03/22/18 0448 03/22/18 0819  BP: 120/67  129/77   Pulse: 77  76   Resp: 20  20   Temp: 98.8 F (37.1 C)  (!) 97.4 F (36.3 C)   TempSrc:   Oral   SpO2: 95% 94% 98% 98%  Weight:      Height:        General: NAD  Cardiovascular: RRR, S1/S2 +, no rubs, no gallops Respiratory: Air entry significantly improve, no wheezing or crackles  Abdominal: Soft, NT, ND, bowel sounds +, PEG tube in place  Extremities: no LE edema   The results of significant diagnostics from this hospitalization (including imaging, microbiology, ancillary and laboratory) are listed below for reference.     Microbiology: Recent Results (from the past 240 hour(s))  MRSA PCR Screening     Status: None   Collection Time: 03/17/18  6:55 AM  Result Value Ref Range Status   MRSA by PCR NEGATIVE NEGATIVE Final  Comment:        The GeneXpert MRSA Assay (FDA approved for NASAL specimens only), is one component of a comprehensive MRSA colonization surveillance program. It is not intended to diagnose MRSA infection nor to guide or monitor treatment for MRSA infections. Performed at [facility], [place] [address] [place], [place] [address]   Respiratory Panel by PCR     Status: None   Collection Time: 03/17/18  9:12 AM  Result Value Ref Range Status   Adenovirus NOT DETECTED NOT DETECTED Final   Coronavirus 229E NOT DETECTED NOT DETECTED Final   Coronavirus HKU1 NOT DETECTED NOT DETECTED Final   Coronavirus NL63 NOT DETECTED NOT DETECTED Final   Coronavirus OC43 NOT DETECTED NOT DETECTED Final   Metapneumovirus NOT DETECTED NOT DETECTED Final   Rhinovirus / Enterovirus NOT DETECTED NOT DETECTED Final   Influenza A NOT DETECTED NOT DETECTED Final   Influenza B NOT DETECTED NOT DETECTED Final   Parainfluenza Virus 1 NOT DETECTED NOT DETECTED Final   Parainfluenza Virus 2 NOT DETECTED NOT  DETECTED Final   Parainfluenza Virus 3 NOT DETECTED NOT DETECTED Final   Parainfluenza Virus 4 NOT DETECTED NOT DETECTED Final   Respiratory Syncytial Virus NOT DETECTED NOT DETECTED Final   Bordetella pertussis NOT DETECTED NOT DETECTED Final   Chlamydophila pneumoniae NOT DETECTED NOT DETECTED Final   Mycoplasma pneumoniae NOT DETECTED NOT DETECTED Final    Comment: Performed at [facility], [place] [address] [place], [place] [address]  Body fluid culture     Status: None   Collection Time: 03/18/18  9:56 AM  Result Value Ref Range Status   Specimen Description   Final    KNEE Performed at [facility] [address], [place], [place] [address]    Special Requests   Final    RIGHT Performed at [facility], [place] [address] [place], [place] [address]    Gram Stain   Final    RARE WBC PRESENT,BOTH PMN AND MONONUCLEAR NO ORGANISMS SEEN    Culture   Final    NO GROWTH 3 DAYS Performed at [facility], [place] [address] [place], [place] [address]    Report Status 03/21/2018 FINAL  Final  Gonococcus culture     Status: None   Collection Time: 03/18/18  9:58 AM  Result Value Ref Range Status   Specimen Description   Final    KNEE Performed at [place] [address], [place], [place] [address]    Special Requests   Final    RIGHT Performed at [facility], [place] [address] [place], [place] [address]    Culture   Final    NO GROWTH 2 DAYS Performed at [place] [address], [place], [place] [address]    Report Status 03/20/2018 FINAL  Final  Anaerobic culture     Status: None (Preliminary result)   Collection Time: 03/18/18  9:58 AM  Result Value Ref Range Status   Specimen Description   Final    KNEE Performed at [place] [address], [place], [place] [address]    Special Requests   Final    RIGHT Performed at [facility],  [place] [address], [place], [place] [address]    Culture   Final    NO ANAEROBES ISOLATED; CULTURE IN PROGRESS FOR 5 DAYS   Report Status PENDING  Incomplete  Labs: BNP (last 3 results) Recent Labs    03/08/18 0405 03/16/18 1000  BNP 129.6* 40.9   Basic Metabolic Panel: Recent Labs  Lab 03/17/18 0447 03/18/18 0443 03/19/18 0412 03/20/18 0432 03/21/18 0419  NA 138 139 138 143 141  K 4.1 3.5 3.2* 4.1 3.9  CL 93* 91* 88* 94* 98*  CO2 33* 38* 38* 38* 36*  GLUCOSE 153* 265* 328* 198* 214*  BUN 20 20 37* 52* 53*  CREATININE 0.88 0.89 0.87 0.90 0.77  CALCIUM 8.6* 8.4* 8.9 9.0 8.8*  MG  --  1.8 2.1 2.2 2.0  PHOS  --  4.6 2.8 3.2  --    Liver Function Tests: Recent Labs  Lab 03/16/18 1001 03/18/18 0443 03/19/18 0412 03/20/18 0432 03/21/18 0419  AST 17 20 29  49* 51*  ALT 26 27 36 71* 96*  ALKPHOS 64 58 67 68 70  BILITOT 0.6 0.7 0.4 0.5 0.7  PROT 6.6 5.9* 6.3* 6.0* 5.8*  ALBUMIN 2.9* 2.4* 2.3* 2.3* 2.4*   No results for input(s): LIPASE, AMYLASE in the last 168 hours. No results for input(s): AMMONIA in the last 168 hours. CBC: Recent Labs  Lab 03/16/18 1001 03/17/18 0447 03/18/18 0443 03/19/18 0412 03/20/18 0432 03/21/18 0419  WBC 4.0 4.0 3.7* 4.7 7.5 6.9  NEUTROABS 2.7  --  2.5 4.0 6.5 5.9  HGB 9.5* 8.4* 8.2* 8.4* 8.3* 8.7*  HCT 29.9* 26.3* 26.4* 26.8* 26.5* 28.0*  MCV 94.0 92.6 93.6 92.7 94.0 93.3  PLT 265 250 260 277 270 255   Cardiac Enzymes: No results for input(s): CKTOTAL, CKMB, CKMBINDEX, TROPONINI in the last 168 hours. BNP: Invalid input(s): POCBNP CBG: Recent Labs  Lab 03/21/18 1219 03/21/18 1605 03/21/18 2119 03/22/18 0101 03/22/18 0825  GLUCAP 212* 215* 159* 253* 186*   D-Dimer No results for input(s): DDIMER in the last 72 hours. Hgb A1c No results for input(s): HGBA1C in the last 72 hours. Lipid Profile No results for input(s): CHOL, HDL, LDLCALC, TRIG, CHOLHDL, LDLDIRECT in the last 72 hours. Thyroid function studies No  results for input(s): TSH, T4TOTAL, T3FREE, THYROIDAB in the last 72 hours.  Invalid input(s): FREET3 Anemia work up No results for input(s): VITAMINB12, FOLATE, FERRITIN, TIBC, IRON, RETICCTPCT in the last 72 hours. Urinalysis    Component Value Date/Time   COLORURINE YELLOW 03/05/2018 2044   APPEARANCEUR CLEAR 03/05/2018 2044   LABSPEC 1.028 03/05/2018 2044   PHURINE 5.0 03/05/2018 2044   GLUCOSEU >=500 (A) 03/05/2018 2044   HGBUR NEGATIVE 03/05/2018 2044   BILIRUBINUR NEGATIVE 03/05/2018 2044   KETONESUR 5 (A) 03/05/2018 2044   PROTEINUR 30 (A) 03/05/2018 2044   NITRITE NEGATIVE 03/05/2018 2044   LEUKOCYTESUR NEGATIVE 03/05/2018 2044   Sepsis Labs Invalid input(s): PROCALCITONIN,  WBC,  LACTICIDVEN Microbiology Recent Results (from the past 240 hour(s))  MRSA PCR Screening     Status: None   Collection Time: 03/17/18  6:55 AM  Result Value Ref Range Status   MRSA by PCR NEGATIVE NEGATIVE Final    Comment:        The GeneXpert MRSA Assay (FDA approved for NASAL specimens only), is one component of a comprehensive MRSA colonization surveillance program. It is not intended to diagnose MRSA infection nor to guide or monitor treatment for MRSA infections. Performed at [facility], [place] [address] [place], [place] [address]   Respiratory Panel by PCR     Status: None   Collection Time: 03/17/18  9:12 AM  Result Value Ref  Range Status   Adenovirus NOT DETECTED NOT DETECTED Final   Coronavirus 229E NOT DETECTED NOT DETECTED Final   Coronavirus HKU1 NOT DETECTED NOT DETECTED Final   Coronavirus NL63 NOT DETECTED NOT DETECTED Final   Coronavirus OC43 NOT DETECTED NOT DETECTED Final   Metapneumovirus NOT DETECTED NOT DETECTED Final   Rhinovirus / Enterovirus NOT DETECTED NOT DETECTED Final   Influenza A NOT DETECTED NOT DETECTED Final   Influenza B NOT DETECTED NOT DETECTED Final   Parainfluenza Virus 1 NOT DETECTED NOT DETECTED Final   Parainfluenza  Virus 2 NOT DETECTED NOT DETECTED Final   Parainfluenza Virus 3 NOT DETECTED NOT DETECTED Final   Parainfluenza Virus 4 NOT DETECTED NOT DETECTED Final   Respiratory Syncytial Virus NOT DETECTED NOT DETECTED Final   Bordetella pertussis NOT DETECTED NOT DETECTED Final   Chlamydophila pneumoniae NOT DETECTED NOT DETECTED Final   Mycoplasma pneumoniae NOT DETECTED NOT DETECTED Final    Comment: Performed at [facility], [place] [address] [place] [address] [place], [place] [address]  Body fluid culture     Status: None   Collection Time: 03/18/18  9:56 AM  Result Value Ref Range Status   Specimen Description   Final    KNEE Performed at [place] [address], [place], [place] [address]    Special Requests   Final    RIGHT Performed at [facility], [place] [address] [place], [place] [address]    Gram Stain   Final    RARE WBC PRESENT,BOTH PMN AND MONONUCLEAR NO ORGANISMS SEEN    Culture   Final    NO GROWTH 3 DAYS Performed at [facility], [place] [address] [place], [place] [address]    Report Status 03/21/2018 FINAL  Final  Gonococcus culture     Status: None   Collection Time: 03/18/18  9:58 AM  Result Value Ref Range Status   Specimen Description   Final    KNEE Performed at [place] [address], [place], [place] [address]    Special Requests   Final    RIGHT Performed at [facility], [place] [address] [place], [place] [address]    Culture   Final    NO GROWTH 2 DAYS Performed at [facility] [address], [place], [place] [address]    Report Status 03/20/2018 FINAL  Final  Anaerobic culture     Status: None (Preliminary result)   Collection Time: 03/18/18  9:58 AM  Result Value Ref Range Status   Specimen Description   Final    KNEE Performed at [place] [address], [place], [place] [address]    Special Requests   Final    RIGHT Performed at [facility], [place] [address], [place], [place] [address]    Culture   Final    NO ANAEROBES ISOLATED; CULTURE IN PROGRESS FOR 5 DAYS   Report Status PENDING  Incomplete    Time coordinating discharge: 32 minutes  SIGNED:  Chipper Oman, MD  [facility] 03/22/2018, 10:36 AM  Pager please text page via  [email or web address]  Note - This record has been created using [employer]. Chart creation errors have been sought, but may not always have been located. Such creation errors do not reflect on the standard of medical care.

## 2018-03-22 NOTE — Progress Notes (Signed)
Physical Therapy Treatment Patient Details Name: Edgar Herrera MRN: [id number] DOB: 24-Feb-1946 Today's Date: 03/22/2018    History of Present Illness 72 yo male admitted with LE weakness, inability to walk and LE edema.  Just d/c home 5/31 after admission for respiratory failure, was receiving HHPT. Hx of head/neck cancer, lymphedema, vertigo, gout, Afib, pressure injury, prostate cancer.     PT Comments    Spouse present and assisted by following with recliner as pt amb with EVA walker(B platform walker) which gives more support such that amb distance is increased.  Pt very deconditioned.  B LE weakness R>L.  No R knee pain reported today.   Pt will need ST Rehab at SNF prior to returning home with spouse.   Follow Up Recommendations  SNF;Supervision/Assistance - 24 hour     Equipment Recommendations       Recommendations for Other Services       Precautions / Restrictions Precautions Precautions: Fall Precaution Comments: PEG Restrictions Weight Bearing Restrictions: No    Mobility  Bed Mobility Overal bed mobility: Needs Assistance       Supine to sit: Min assist     General bed mobility comments: increased ability to self perform and tolerate sitting EOB.  used bed pad to complete scooting to EOB    Transfers Overall transfer level: Needs assistance Equipment used: Rolling walker (2 wheeled) Transfers: Sit to/from Stand Sit to Stand: Max assist;+2 physical assistance;+2 safety/equipment         General transfer comment: Max Assist + 2 side by side with partial upright stance of B hips and knees flexed.  Briefly stoof long enough to pull up shorts then quickly sat back down on bed due to weakness/fatigue.  Ambulation/Gait Ambulation/Gait assistance: Min assist;+2 physical assistance;+2 safety/equipment Gait Distance (Feet): 42 Feet(18, 24 one sitting rest break)   Gait Pattern/deviations: Step-to pattern;Step-through pattern;Decreased stance time - right;Trunk  flexed;Narrow base of support Gait velocity: decreased   General Gait Details: used EVA walker for increased support and to increase amb distance while spouse followed with recliner.  Poor flexed posture and weakness throughout.     Stairs             Wheelchair Mobility    Modified Rankin (Stroke Patients Only)       Balance                                            Cognition Arousal/Alertness: Awake/alert Behavior During Therapy: WFL for tasks assessed/performed Overall Cognitive Status: Within Functional Limits for tasks assessed                                 General Comments: pleasant and motivated      Exercises      General Comments        Pertinent Vitals/Pain Pain Assessment: No/denies pain    Home Living                      Prior Function            PT Goals (current goals can now be found in the care plan section) Progress towards PT goals: Progressing toward goals    Frequency    Min 3X/week      PT Plan Current plan remains appropriate  Co-evaluation              AM-PAC PT "6 Clicks" Daily Activity  Outcome Measure  Difficulty turning over in bed (including adjusting bedclothes, sheets and blankets)?: A Lot Difficulty moving from lying on back to sitting on the side of the bed? : A Lot Difficulty sitting down on and standing up from a chair with arms (e.g., wheelchair, bedside commode, etc,.)?: A Lot Help needed moving to and from a bed to chair (including a wheelchair)?: A Lot Help needed walking in hospital room?: A Lot Help needed climbing 3-5 steps with a railing? : Total 6 Click Score: 11    End of Session Equipment Utilized During Treatment: Gait belt Activity Tolerance: Patient limited by fatigue;Patient limited by pain Patient left: with call bell/phone within reach;with family/visitor present;in chair Nurse Communication: Mobility status;Need for lift equipment PT  Visit Diagnosis: Muscle weakness (generalized) (M62.81);Other abnormalities of gait and mobility (R26.89);Pain Pain - part of body: Knee     Time: 3646-8032 PT Time Calculation (min) (ACUTE ONLY): 26 min  Charges:  $Gait Training: 8-22 mins $Therapeutic Activity: 8-22 mins                    G Codes:       Rica Koyanagi  PTA [facility]  Acute  Rehab Pager      [phone]

## 2018-03-22 NOTE — Clinical Social Work Placement (Signed)
Pt discharged with plan to admit to [facility]- report [phone]. (pt will admit to private room) Will arrange Lane transportation. Facility awaiting [facility] authorization- informed pt and wife. Provided DC information via the [facility].  CLINICAL SOCIAL WORK PLACEMENT  NOTE  Date:  03/22/2018  Patient Details  Name: Edgar Herrera MRN: [id number] Date of Birth: [birth year]  Clinical Social Work is seeking post-discharge placement for this patient at the [place] level of care (*CSW will initial, date and re-position this form in  chart as items are completed):  Yes   Patient/family provided with [place] Work Department's list of facilities offering this level of care within the geographic area requested by the patient (or if unable, by the patient's family).  Yes   Patient/family informed of their freedom to choose among providers that offer the needed level of care, that participate in Medicare, Medicaid or managed care program needed by the patient, have an available bed and are willing to accept the patient.  Yes   Patient/family informed of [facility]'s ownership interest in [facility] and [facility], as well as of the fact that they are under no obligation to receive care at these facilities.  PASRR submitted to EDS on 03/22/18     PASRR number received on 03/22/18     Existing PASRR number confirmed on       FL2 transmitted to all facilities in geographic area requested by pt/family on 03/21/18     FL2 transmitted to all facilities within larger geographic area on       Patient informed that his/her managed care company has contracts with or will negotiate with certain facilities, including the following:        Yes   Patient/family informed of bed offers received.  Patient chooses bed at [facility] and [facility] recommends and patient chooses bed at [facility]hab    Patient to be  transferred to [facility] and [facility] on 03/22/18.  Patient to be transferred to facility by PTAR     Patient family notified on 03/22/18 of transfer.  Name of family member notified:  wife [facility]     PHYSICIAN       Additional Comment:    _______________________________________________ Nila Nephew, LCSW 03/22/2018, 4:01 PM [phone]

## 2018-03-22 NOTE — Care Management Important Message (Signed)
Important Message  Patient Details  Name: Edgar Herrera MRN: [id number] Date of Birth: [birth year]   Medicare Important Message Given:  Yes    Kerin Salen 03/22/2018, 11:52 AMImportant Message  Patient Details  Name: Edgar Herrera MRN: [id number] Date of Birth: 07-06-1946   Medicare Important Message Given:  Yes    Kerin Salen 03/22/2018, 11:52 AM

## 2018-03-22 NOTE — Progress Notes (Signed)
Report called to [facility]. PTAR has been arranged awaiting PT[facility] to transport.

## 2018-03-22 NOTE — Progress Notes (Signed)
Provided bed offers to pt and wife this morning- [facility] or [facility] initially selected. CSW was informed by admissions beds not available at either facility- Wife states [facility] is next selection. Notified admissions-beds available, and facility began insurance authorization request.  Sharren Bridge, MSW, LCSW Clinical Social Work 03/22/2018 [phone]

## 2018-03-23 LAB — ANAEROBIC CULTURE

## 2018-04-01 ENCOUNTER — Telehealth (HOSPITAL_COMMUNITY): Payer: Self-pay | Admitting: Dentistry

## 2018-04-01 ENCOUNTER — Ambulatory Visit (HOSPITAL_COMMUNITY): Payer: Self-pay | Admitting: Dentistry

## 2018-04-01 ENCOUNTER — Encounter (HOSPITAL_COMMUNITY): Payer: Self-pay | Admitting: Dentistry

## 2018-04-01 ENCOUNTER — Encounter: Payer: Self-pay | Admitting: *Deleted

## 2018-04-01 VITALS — BP 129/75 | HR 75 | Temp 98.5°F | Wt 189.0 lb

## 2018-04-01 DIAGNOSIS — K1233 Oral mucositis (ulcerative) due to radiation: Secondary | ICD-10-CM

## 2018-04-01 DIAGNOSIS — C01 Malignant neoplasm of base of tongue: Secondary | ICD-10-CM

## 2018-04-01 DIAGNOSIS — K117 Disturbances of salivary secretion: Secondary | ICD-10-CM | POA: Diagnosis not present

## 2018-04-01 DIAGNOSIS — Z9221 Personal history of antineoplastic chemotherapy: Secondary | ICD-10-CM

## 2018-04-01 DIAGNOSIS — K1230 Oral mucositis (ulcerative), unspecified: Secondary | ICD-10-CM

## 2018-04-01 DIAGNOSIS — R682 Dry mouth, unspecified: Secondary | ICD-10-CM

## 2018-04-01 DIAGNOSIS — R432 Parageusia: Secondary | ICD-10-CM

## 2018-04-01 DIAGNOSIS — Z923 Personal history of irradiation: Secondary | ICD-10-CM

## 2018-04-01 DIAGNOSIS — R1310 Dysphagia, unspecified: Secondary | ICD-10-CM | POA: Diagnosis not present

## 2018-04-01 MED FILL — dilTIAZem HCL 60 MG TABS: 60 | 25 days supply | Qty: 100 | Fill #0

## 2018-04-01 NOTE — Progress Notes (Signed)
04/01/2018  Patient Name:   Edgar Herrera Date of Birth:   Feb 29, 1946 Medical Record Number: [id number]  BP 129/75 (BP Location: Right Arm)   Pulse 75   Temp 98.5 F (36.9 C)   Wt 189 lb (85.7 kg)   BMI 27.91 kg/m   Edgar Herrera presents for oral examination after radiation therapy.patient received treatments from or 10 2000 1930 03/08/2018. Patient received 5 weekly doses of chemotherapy.  REVIEW OF CHIEF COMPLAINTS:  DRY MOUTH: yes. HARD TO SWALLOW: yes  HURT TO SWALLOW: no TASTE CHANGES: patient still has no taste SORES IN MOUTH: the patient denies having sores TRISMUS: the patient denies having trismus symptoms. WEIGHT: 189 pounds down from 219.  HOME OH REGIMEN:  BRUSHING: Twice a day FLOSSING: Once a day RINSING: Rinses with Biotene FLUORIDE: The patient has not been using on a daily basis. Patient encouraged to use daily to prevent future caries. TRISMUS EXERCISES:  Maximum interincisal opening: 33 mm   DENTAL EXAM:  Oral Hygiene:(PLAQUE): Plaque noted. Oral hygiene improvement highly suggested. LOCATION OF MUCOSITIS: posterior soft palate and uvula area. DESCRIPTION OF SALIVA: Decreased and thick saliva. ANY EXPOSED [place]:   None noted OTHER WATCHED AREAS: tooth #17. Patient to have dentist check with the dose to the area #17 of tooth eeds to be extracted in the future. DX: Xerostomia, Dysgeusia, Dysphagia, Accretions and Mucositis  RECOMMENDATIONS: 1. Brush after meals and at bedtime.  Use fluoride at bedtime. 2. Use trismus exercises as directed. 3. Use Biotene Rinse or salt water/baking soda rinses. 4. Multiple sips of water as needed. 5. Return to  Dr. Kenton [facility] in 2-3 months for periodontal maintenance procedures.   Lenn Cal, DDS

## 2018-04-01 NOTE — Telephone Encounter (Signed)
04/01/2018  Patient:            Edgar Herrera Date of Birth:  1946/01/10 MRN:                [id number]   Dental Medicine received a message on answering machine at 8:30 AM from [facility] and [place]. The patient is a current resident of the rehabilitation center and the rehabilitation center called to cancel the appointment for 11:15 AM this morning.  We will wait for patient or rehabilitation center to call and reschedule his status post radiation therapy oral examination.  Lenn Cal, DDS

## 2018-04-01 NOTE — Patient Instructions (Signed)
RECOMMENDATIONS: 1. Brush after meals and at bedtime.  Use fluoride at bedtime. 2. Use trismus exercises as directed. 3. Use Biotene Rinse or salt water/baking soda rinses. 4. Multiple sips of water as needed. 5. Return to  Dr. Kenton [facility]isher in 2-3 months for periodontal maintenance procedures.   Lenn Cal, DDS

## 2018-04-02 NOTE — Progress Notes (Signed)
Error. Dr. Enrique Sack

## 2018-04-02 NOTE — Progress Notes (Signed)
Oncology Nurse Navigator Documentation  Met Edgar Herrera and his wife in Radiation Waiting per their request s/p Dental Medicine appt. Wife indicated he had been DC'd from [facility] yesterday, home PT to be arranged through this facility.  Gayleen Orem, RN, BSN Head & Neck Oncology Nurse [place] at [facility] [phone]

## 2018-04-04 ENCOUNTER — Telehealth: Payer: Self-pay | Admitting: *Deleted

## 2018-04-04 NOTE — Telephone Encounter (Signed)
Oncology Nurse Navigator Documentation  Received call from [facility] with [facility] during her home visit with patient to coordinate outpatient PT.  We discussed conflict w/ his receiving home PT with [facility] and OP SLP with [facility].  Pt wife indicated via [place] Bamberg she thinks husband needs frequent SLP (several times weekly) but travel to [place] from [place] is burdensome.  Estill Bamberg recognized [facility] SLP is not specialized in mgt of H&N patients.  I provided her SLP Glendell Docker Schinke's phone #, she indicated she will call him to discuss further.  Gayleen Orem, RN, BSN Head & Neck Oncology Nurse [place] at [place] [phone]

## 2018-04-08 ENCOUNTER — Ambulatory Visit: Payer: Medicare Other

## 2018-04-09 NOTE — Progress Notes (Signed)
  Edgar Herrera presents for follow up of radiation completed 03/08/18 to his head and neck/ base of tongue.   Pain issues, if any: He denies.  Using a feeding tube?: Yes, 5-6 nutritional supplements daily.  Weight changes, if any:  Wt Readings from Last 3 Encounters:  04/12/18 190 lb 3.2 oz (86.3 kg)  04/01/18 189 lb (85.7 kg)  03/19/18 194 lb 0.1 oz (88 kg)   Swallowing issues, if any: He gets choked with food and liquids. He has a modified barium swallow 04/22/18.  Smoking or chewing tobacco? No Using fluoride trays daily? Yes daily.  Last ENT visit was on: He will see Dr. Ardis Hughs in [place] on 05/16/18.  Other notable issues, if any:  He continues to cough up blood mixed with his saliva. His wife is concerned about the [place] calling him for a colonoscopy. She doesn't believe he is well enough at this time.   BP (!) 146/70   Pulse (!) 49   Temp 98.8 F (37.1 C)   Resp 18   Wt 190 lb 3.2 oz (86.3 kg)   SpO2 99%   BMI 28.09 kg/m

## 2018-04-12 ENCOUNTER — Encounter: Payer: Self-pay | Admitting: Radiation Oncology

## 2018-04-12 ENCOUNTER — Ambulatory Visit
Admission: RE | Admit: 2018-04-12 | Discharge: 2018-04-12 | Disposition: A | Discharge status: Home / Self Care | Payer: Medicare Other | Source: Ambulatory Visit | Attending: Radiation Oncology | Admitting: Radiation Oncology

## 2018-04-12 ENCOUNTER — Other Ambulatory Visit: Payer: Self-pay

## 2018-04-12 VITALS — BP 146/70 | HR 49 | Temp 98.8°F | Resp 18 | Wt 190.2 lb

## 2018-04-12 DIAGNOSIS — R634 Abnormal weight loss: Secondary | ICD-10-CM

## 2018-04-12 DIAGNOSIS — K5900 Constipation, unspecified: Secondary | ICD-10-CM | POA: Diagnosis not present

## 2018-04-12 DIAGNOSIS — Z7984 Long term (current) use of oral hypoglycemic drugs: Secondary | ICD-10-CM | POA: Diagnosis not present

## 2018-04-12 DIAGNOSIS — C01 Malignant neoplasm of base of tongue: Secondary | ICD-10-CM | POA: Diagnosis present

## 2018-04-12 DIAGNOSIS — Z923 Personal history of irradiation: Secondary | ICD-10-CM | POA: Diagnosis not present

## 2018-04-12 DIAGNOSIS — Z79899 Other long term (current) drug therapy: Secondary | ICD-10-CM | POA: Insufficient documentation

## 2018-04-12 NOTE — Progress Notes (Signed)
Radiation Oncology         [phone] ________________________________  Name: Edgar Herrera MRN: [id number]  Date: 04/12/2018  DOB: [birth year] (72)  Follow-Up Visit Note  CC: [facility]  Berneice Heinrich, MD  Diagnosis and Prior Radiotherapy:       ICD-10-CM   1. 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 He is accompanied by his wife today. He is doing well overall.   He was in rehab for a week and he now is at home with home health aid and he has at home swallowing exercises. He will have a modified barium swallow test completed. He is using the fluoride trays and he is able to swallow water. However, he notes that he will aspirate sometimes with liquids. He is able to swallow when he completes the exercises that he was advised of by the speech therapist. He is able to consume some foods PO, however, it takes a while to complete or due to the taste change, he doesn't finish. He doesn't have a follow up with Medical Oncology as of yet.   He has been followed by his PCP, dentist, and an oncologist at the [facility].   On review of systems, he reports hemoptysis streak in mucous, mild aspiration with swallowing intermittently, and mildly improving copious salivary secretions. he denies any other symptoms. Pertinent positives are listed and detailed within the above HPI.                     ALLERGIES:  has No Known Allergies.  Meds: Current Outpatient Medications  Medication Sig Dispense Refill  . acetaminophen (TYLENOL) 500 MG tablet Take 1,000 mg by mouth every 6 (six) hours as needed for  fever.    Marland Kitchen allopurinol (ZYLOPRIM) 100 MG tablet Take 100 mg by mouth daily.    . Amino Acids-Protein Hydrolys (FEEDING SUPPLEMENT, PRO-STAT SUGAR FREE 64,) LIQD Place 30 mLs into feeding tube 3 (three) times daily. 900 mL 0  . apixaban (ELIQUIS) 5 MG TABS tablet Take 5 mg by mouth 2 (two) times daily.    . Artificial Tear Solution (SYSTANE CONTACTS) SOLN Place 1 drop into both eyes daily.     . bisacodyl (DULCOLAX) 5 MG EC tablet Take 1 tablet (5 mg total) by mouth daily as needed for moderate constipation. 30 tablet 0  . colchicine 0.6 MG tablet Take 1 tablet (0.6 mg total) by mouth daily.    . cyanocobalamin 500 MCG tablet Take 500 mcg by mouth 2 (two) times daily.    Marland Kitchen diltiazem (CARDIZEM) 60 MG tablet Take 1 tablet (60 mg total) by mouth every 6 (six) hours.    Marland Kitchen glipiZIDE (GLUCOTROL) 5 MG tablet Take 2.5-5 mg by mouth See admin instructions. Take 1 tablet before breakfast and Take 1/2 tablet before dinner.    Marland Kitchen guaiFENesin-dextromethorphan (ROBITUSSIN DM) 100-10 MG/5ML syrup Take 10 mLs by mouth every 4 (four) hours as needed for cough. 118 mL 0  . metFORMIN (GLUCOPHAGE) 1000 MG tablet Take 1,000 mg by mouth 2 (two) times daily with a meal.    .  metoprolol tartrate (LOPRESSOR) 25 MG tablet Place 1 tablet (25 mg total) into feeding tube 2 (two) times daily. 30 tablet 0  . Nutritional Supplements (FEEDING SUPPLEMENT, JEVITY 1.5 CAL/FIBER,) LIQD Place 1,000 mLs into feeding tube continuous.    . pravastatin (PRAVACHOL) 40 MG tablet Take 40 mg by mouth every evening.     Marland Kitchen scopolamine (TRANSDERM-SCOP) 1 MG/3DAYS Place 1 patch (1.5 mg total) onto the skin every 3 (three) days. 10 patch 0  . sodium fluoride (FLUORISHIELD) 1.1 % GEL dental gel Instill one drop of gel per tooth space of fluoride tray. Place over teeth for 5 minutes. Remove. Spit out excess. Repeat nightly. (Patient taking differently: Place 1 application onto teeth at bedtime. Instill one drop of gel per tooth space of fluoride tray.  Place over teeth for 5 minutes. Remove. Spit out excess. Repeat nightly.) 120 mL prn  . traMADol (ULTRAM) 50 MG tablet Place 0.5 tablets (25 mg total) into feeding tube every 12 (twelve) hours as needed for moderate pain. 10 tablet 0   No current facility-administered medications for this encounter.     Physical Findings: The patient is in no acute distress. Patient is alert and oriented. Wt Readings from Last 3 Encounters:  04/12/18 190 lb 3.2 oz (86.3 kg)  04/01/18 189 lb (85.7 kg)  03/19/18 194 lb 0.1 oz (88 kg)    weight is 190 lb 3.2 oz (86.3 kg). His temperature is 98.8 F (37.1 C). His blood pressure is 146/70 (abnormal) and his pulse is 49 (abnormal). His respiration is 18 and oxygen saturation is 99%. .  General: Alert and oriented, in no acute distress HEENT: Head is normocephalic. Extraocular movements are intact. Oropharynx is notable for mucous membranes moist. Resolving patches of confluent mucositis over the back of his tongue and his soft palate.  Neck: Neck is notable for no palpable cervical or supraclavicular adenopathy.  Skin: Skin in treatment fields shows satisfactory healing in the treatment fields over neck. Lymphatics: see Neck Exam Psychiatric: Judgment and insight are intact. Affect is appropriate.   Lab Findings: Lab Results  Component Value Date   WBC 6.9 03/21/2018   HGB 8.7 (L) 03/21/2018   HCT 28.0 (L) 03/21/2018   MCV 93.3 03/21/2018   PLT 255 03/21/2018    Lab Results  Component Value Date   TSH 0.557 02/01/2018    Radiographic Findings: Dg Chest 2 View  Result Date: 03/16/2018 CLINICAL DATA:  Cough for 2 months.  History of throat carcinoma EXAM: CHEST - 2 VIEW COMPARISON:  Mar 08, 2018 FINDINGS: There is a small left pleural effusion with left base atelectatic change. Right lung is clear. Heart is upper normal in size with pulmonary vascularity normal. No adenopathy. Port-A-Cath tip is cavoatrial junction. No pneumothorax. There is  degenerative change in the thoracic spine. IMPRESSION: Small left pleural effusion with left base atelectasis. Early pneumonia in the left base cannot be excluded. Lungs elsewhere clear. Stable cardiac silhouette. Port-A-Cath tip at cavoatrial junction. Electronically Signed   By: Lowella Grip III M.D.   On: 03/16/2018 10:32   Ct Head Wo Contrast  Result Date: 03/16/2018 CLINICAL DATA:  Lower extremity weakness. History of throat carcinoma EXAM: CT HEAD WITHOUT CONTRAST TECHNIQUE: Contiguous axial images were obtained from the base of the skull through the vertex without intravenous contrast. COMPARISON:  None. FINDINGS: Brain: There is moderate diffuse atrophy. There is no intracranial mass, hemorrhage, extra-axial fluid collection, or midline shift. There is patchy small vessel disease throughout the  centra semiovale bilaterally. Elsewhere gray-white compartments appear normal. No evident acute infarct. Vascular: No hyperdense vessels. There is calcification in each carotid siphon as well as in the distal vertebral arteries. Skull: Bony calvarium appears intact. Sinuses/Orbits: There is a small retention cyst in the anterior left maxillary antrum. There is mucosal thickening in several ethmoid air cells. There is opacification in a somewhat hypoplastic left frontal sinus. Orbits appear symmetric bilaterally. Other: There is opacification multiple mastoid air cells bilaterally. IMPRESSION: Atrophy with patchy periventricular small vessel disease. No evident acute infarct. No mass or hemorrhage. There are foci of arterial vascular calcification. Areas of paranasal sinus and mastoid disease noted. Electronically Signed   By: Lowella Grip III M.D.   On: 03/16/2018 10:02   Mr Jeri Cos IO Contrast  Result Date: 03/16/2018 CLINICAL DATA:  72 year old male with bilateral lower extremity pain, swelling, and weakness for 2 days. EXAM: MRI HEAD WITHOUT AND WITH CONTRAST TECHNIQUE: Multiplanar, multiecho pulse  sequences of the brain and surrounding structures were obtained without and with intravenous contrast. CONTRAST:  83mL MULTIHANCE GADOBENATE DIMEGLUMINE 529 MG/ML IV SOLN COMPARISON:  Head CT without contrast 03/16/2018. CT neck 02/25/2018. FINDINGS: Brain: No restricted diffusion to suggest acute infarction. No midline shift, mass effect, evidence of mass lesion, ventriculomegaly, extra-axial collection or acute intracranial hemorrhage. Cervicomedullary junction and pituitary are within normal limits. Cerebral volume is within normal limits for age. Patchy bilateral moderate for age cerebral white matter T2 and FLAIR hyperintensity in a nonspecific configuration. Similar patchy T2 hyperintensity in the pons. No cortical encephalomalacia or chronic cerebral blood products identified. Probable small perivascular space in the right thalamus (normal variant). The other deep gray matter nuclei and the cerebellum appear normal. No abnormal enhancement identified. No dural thickening Vascular: Major intracranial vascular flow voids are preserved. Generalized intracranial artery tortuosity. The major dural venous sinuses are enhancing and appear patent. Skull and upper cervical spine: Negative visible cervical spine. Normal Sukup marrow signal. Sinuses/Orbits: Normal orbits soft tissues. Paranasal sinuses are clear. Other: Small volume of layering secretions in the visible pharynx. There are bilateral mastoid air cell effusions. Visible internal auditory structures otherwise appear normal. Scalp and face soft tissues appear negative. IMPRESSION: 1.  No acute intracranial abnormality. 2. Moderate for age signal changes in the cerebral white matter and pons, nonspecific but most commonly due to chronic small vessel disease. 3. Retained secretions in the pharynx with bilateral mastoid air cell effusions. Electronically Signed   By: Genevie Ann M.D.   On: 03/16/2018 16:25   Dg Chest Port 1 View  Result Date: 03/20/2018 CLINICAL  DATA:  Shortness of breath EXAM: PORTABLE CHEST 1 VIEW COMPARISON:  March 19, 2018 FINDINGS: Port-A-Cath tip is in the right atrium. No pneumothorax. There is left base atelectatic change. There is no edema or consolidation. Heart is upper normal in size with pulmonary vascularity normal. No adenopathy. No evident Hartl lesions. IMPRESSION: Left base atelectasis. No edema or consolidation. Stable cardiac silhouette. No pneumothorax. Electronically Signed   By: Lowella Grip III M.D.   On: 03/20/2018 07:12   Dg Chest Port 1 View  Result Date: 03/19/2018 CLINICAL DATA:  Shortness of Breath EXAM: PORTABLE CHEST 1 VIEW COMPARISON:  03/18/2018 FINDINGS: Cardiac shadow is stable. Left-sided chest wall port is noted. The overall inspiratory effort remains poor with some left basilar atelectatic changes no new focal abnormality is noted. IMPRESSION: Stable appearance of the chest. Electronically Signed   By: Inez Catalina M.D.   On: 03/19/2018 08:50  Dg Chest Port 1 View  Result Date: 03/18/2018 CLINICAL DATA:  Shortness of breath EXAM: PORTABLE CHEST 1 VIEW COMPARISON:  Chest x-ray of 03/16/2018 FINDINGS: The lungs again are poorly aerated with persistent opacity at the left lung base some of which represents atelectasis with apparent elevation of the left hemidiaphragm. Left subpulmonic effusion cannot be excluded. Heart size is stable. Port-A-Cath remains. IMPRESSION: Poor aeration with persistent opacity at the left lung base. Electronically Signed   By: Ivar Drape M.D.   On: 03/18/2018 08:44   Dg Knee Complete 4 Views Left  Result Date: 03/17/2018 CLINICAL DATA:  Soft tissue swelling EXAM: LEFT KNEE - COMPLETE 4+ VIEW COMPARISON:  None. FINDINGS: Frontal, lateral, and bilateral oblique views were obtained. There is no appreciable fracture or dislocation. There is a large joint effusion. There is moderate narrowing of the lateral compartment and patellofemoral joint with slight narrowing medially. There is  spurring in all compartments. There is bony overgrowth along the inferior patella. No erosive changes. IMPRESSION: Moderate osteoarthritic change. Large joint effusion. 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 No fracture or dislocation. Electronically Signed   By: Lowella Grip III M.D.   On: 03/17/2018 11:42    Impression/Plan:    1) Head and Neck Cancer Status: healing well from chemo radiation.   2) Nutritional Status: weight is stabilizing, still PEG dependent PEG tube: Using PEG tube for nutrition.   3)  Swallowing: Advised the patient to continue with the swallowing techniques as informed by the Speech therapist.   4) Dental: Encouraged to continue regular followup with dentistry, and dental hygiene including fluoride rinses. He reports compliance  5) Thyroid function: recheck annually Lab Results  Component Value Date   TSH 0.557 02/01/2018    6) Other: Maintain follow up with Dr. Ardis Hughs in August. Will provide the patient with informational packet on Oral care following Head and Neck Radiation Treatment.    Gayleen Orem, RN, our Head and Neck Oncology Navigator will reach out about the patient's reassignment to another Medical Oncologist due to Dr. Clydene Laming recent departure.   7) Follow-up in 2.5 months in radiation oncology with a PET scan prior. The patient was encouraged to call with any issues or questions before then.  Patient was given an informational document that our care team curated for head and neck cancer survivors to help with symptom management,and overall post-treatment wellness. Phone number of Gayleen Orem, RN, our Head and Neck Oncology Navigator was provided in case any questions arise.  I spent 15 minutes minutes face to face with the patient and more than 50% of that time was spent in counseling and/or coordination of care. _____________________________________   [name], MD  This document serves as a record of  services personally performed by [name], MD. It was created on her behalf by Steva Colder, a trained medical scribe. The creation of this record is based on the scribe's personal observations and the provider's statements to them. This document has been checked and approved by the attending provider.

## 2018-05-10 ENCOUNTER — Telehealth: Payer: Self-pay | Admitting: Hematology

## 2018-05-10 ENCOUNTER — Inpatient Hospital Stay: Payer: Medicare Other | Attending: Radiation Oncology | Admitting: Hematology

## 2018-05-10 ENCOUNTER — Inpatient Hospital Stay: Payer: Medicare Other

## 2018-05-10 ENCOUNTER — Encounter: Payer: Self-pay | Admitting: Hematology

## 2018-05-10 ENCOUNTER — Telehealth: Payer: Self-pay | Admitting: *Deleted

## 2018-05-10 ENCOUNTER — Encounter: Payer: Self-pay | Admitting: *Deleted

## 2018-05-10 VITALS — BP 135/78 | HR 74 | Temp 98.4°F | Resp 17 | Ht 69.0 in | Wt 195.6 lb

## 2018-05-10 DIAGNOSIS — I4891 Unspecified atrial fibrillation: Secondary | ICD-10-CM | POA: Diagnosis not present

## 2018-05-10 DIAGNOSIS — I10 Essential (primary) hypertension: Secondary | ICD-10-CM | POA: Insufficient documentation

## 2018-05-10 DIAGNOSIS — D649 Anemia, unspecified: Secondary | ICD-10-CM | POA: Insufficient documentation

## 2018-05-10 DIAGNOSIS — C01 Malignant neoplasm of base of tongue: Secondary | ICD-10-CM | POA: Insufficient documentation

## 2018-05-10 DIAGNOSIS — Z8546 Personal history of malignant neoplasm of prostate: Secondary | ICD-10-CM | POA: Insufficient documentation

## 2018-05-10 DIAGNOSIS — R1310 Dysphagia, unspecified: Secondary | ICD-10-CM | POA: Insufficient documentation

## 2018-05-10 LAB — CBC WITH DIFFERENTIAL (CANCER CENTER ONLY)
Basophils Absolute: 0 10*3/uL (ref 0.0–0.1)
Basophils Relative: 0 %
Eosinophils Absolute: 0.1 10*3/uL (ref 0.0–0.5)
Eosinophils Relative: 1 %
HCT: 34.3 % — ABNORMAL LOW (ref 38.4–49.9)
Hemoglobin: 11 g/dL — ABNORMAL LOW (ref 13.0–17.1)
Lymphocytes Relative: 10 %
Lymphs Abs: 0.5 10*3/uL — ABNORMAL LOW (ref 0.9–3.3)
MCH: 29.9 pg (ref 27.2–33.4)
MCHC: 32.2 g/dL (ref 32.0–36.0)
MCV: 93 fL (ref 79.3–98.0)
MONOS PCT: 11 %
Monocytes Absolute: 0.5 10*3/uL (ref 0.1–0.9)
Neutro Abs: 3.8 10*3/uL (ref 1.5–6.5)
Neutrophils Relative %: 78 %
Platelet Count: 169 10*3/uL (ref 140–400)
RBC: 3.69 MIL/uL — ABNORMAL LOW (ref 4.20–5.82)
RDW: 16.3 % — ABNORMAL HIGH (ref 11.0–14.6)
WBC Count: 4.8 10*3/uL (ref 4.0–10.3)

## 2018-05-10 LAB — CMP (CANCER CENTER ONLY)
ALT: 16 U/L (ref 0–44)
AST: 15 U/L (ref 15–41)
Albumin: 3.5 g/dL (ref 3.5–5.0)
Alkaline Phosphatase: 79 U/L (ref 38–126)
Anion gap: 11 (ref 5–15)
BILIRUBIN TOTAL: 0.5 mg/dL (ref 0.3–1.2)
BUN: 24 mg/dL — AB (ref 8–23)
CO2: 29 mmol/L (ref 22–32)
CREATININE: 0.77 mg/dL (ref 0.61–1.24)
Calcium: 9.5 mg/dL (ref 8.9–10.3)
Chloride: 104 mmol/L (ref 98–111)
GFR, Est AFR Am: >60 mL/min (ref >60)
Glucose, Bld: 129 mg/dL — ABNORMAL HIGH (ref 70–99)
Potassium: 4.5 mmol/L (ref 3.5–5.1)
Sodium: 144 mmol/L (ref 135–145)
TOTAL PROTEIN: 6.5 g/dL (ref 6.5–8.1)

## 2018-05-10 LAB — PHOSPHORUS: PHOSPHORUS: 3.4 mg/dL (ref 2.5–4.6)

## 2018-05-10 LAB — MAGNESIUM: MAGNESIUM: 1.7 mg/dL (ref 1.7–2.4)

## 2018-05-10 NOTE — Telephone Encounter (Signed)
Appointment added per 8/2 sch msg

## 2018-05-10 NOTE — Progress Notes (Signed)
Oncology Nurse Navigator Documentation  Met with Mr. Mcshan during Est Pt appt with Dr. Burr Medico.  He was accompanied by his wife. He indicated he was doing well since completing RT and chemo for base of tongue cancer 03/08/18. He reported:  Denied pain.  Gradually increasing strength, energy.  Going to [facility] several times weekly, increasing activity in general.  Gaining weight.  Some taste sensations returning.  Eating solid foods, eg eggs and waffles for breakfast, shrimp, hushpuppies, home fries.  Instilling 5 cans supplement daily via PEG.  Drinking thickened liquids based on findings of recent swallowing eval.  Still using scopolamine.  Sees Dr. Edison Nasuti next week, 1st appt since EOT; also seeing dietician. They acknowledged understanding of 9/18 10:00 re-staging PET, 9/20 10:00 follow=up with Dr. Isidore Moos. They denied concerns/needs, I encouraged them to call me should that change.  Gayleen Orem, RN, BSN Head & Neck Oncology Nurse [place] at [place] [phone]

## 2018-05-10 NOTE — Telephone Encounter (Signed)
Oncology Nurse Navigator Documentation  Called Edgar Herrera re missed 1:45 appt, he indicated thought was next week.  Dr. Grant Ruts indicated willing to see him this afternoon, I called him to inform.  He expressed appreciation.  Gayleen Orem, RN, BSN Head & Neck Oncology Nurse [place] at [place] [phone]

## 2018-05-10 NOTE — Progress Notes (Signed)
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   SUMMARY OF ONCOLOGIC HISTORY: Oncology History   Cancer Staging Squamous cell carcinoma of base of tongue ([place]) Staging form: Pharynx - HPV-Mediated Oropharynx, AJCC 8th Edition - Clinical: Stage II (cT3, cN1, cM0, p16+) - Signed by [name], MD on 01/01/2018       Squamous cell carcinoma of base of tongue ([place])   12/17/2017 Initial Diagnosis    Squamous cell carcinoma of base of tongue [facility]):  -- laryngoscopic biopsy of the mass at the base of the tongue base positive for invasive poorly differentiated carcinoma positive for p40 and p16      01/01/2018 Cancer Staging    Staging form: Pharynx - HPV-Mediated Oropharynx, AJCC 8th Edition - Clinical: Stage II (cT3, cN1, cM0, p16+) - Signed by [name], MD on 01/01/2018       01/16/2018 - 03/08/2018 Radiation Therapy    HN_BOT and bilateral neck/ 70 Gy delivered in 35 fractions of 2 Gy to gross disease, lower doses to empiric nodes, under Dr. Isidore Moos        01/18/2018 - 02/15/2018 Chemotherapy    Carboplatin AUC 2, d1 + paclitaxel 45mg /m2, d1 QWk --Week #1, 01/18/18: --Week #2, 01/24/18: --Week #3, 02/01/18: --Week #4, 02/08/18: --Week #5, 02/15/18:        History of Presenting Illness on 01/08/18 per Dr. Lebron Conners "Edgar Herrera is a 72 y.o. male referred to the the [place] for diagnosis of squamous cell carcinoma of tongue base,  referred by Dr. [name]. Patient initially presented to a [place] emergency department 11/29/17 with complaints of intermittent dysphasia and hemoptysis episode.  CT scan of the neck obtained at the time demonstrated a large exophytic mass. Patient was seen by Dr.S Isac Caddy at [facility] on 12/10/17 and underwent a fiberoptic laryngoscopy on 12/17/17.  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  Additional staging imaging was obtained with PET/CT demonstrating no evidence of distant metastatic disease, but Lt base of the tongue mass, ipsilateral adenopathy.  Patient was evaluated by [facility] for possible tors procedure and was not found to be a suitable candidate according to Dr. Conley Canal.  Patient was subsequently evaluated by Dr. [name] who has referred the patient to our clinic for possible concurrent multimodality therapy.  Has a past medical history of atrial fibrillation, hypertension, and prostate cancer, but otherwise remains active and healthy.  He is a lifelong non-smoker, does not use alcohol to excess.  Continues to have sore throat.  Currently, patient undergoing preparations for undergoing PEG tube placement and Infuse-a-Port placement at [facility] due to high risk of the procedure in the context of large base of the tongue mass. No recurrent hemoptysis so far."   CURRENT THERAPY  Surveillance    INTERVAL HISTORY:  Edgar Herrera is here for follow up of squamous cell Carcinoma of the tongue. He was previously under the care of Dr. Lebron Conners and has  transferred his care to me. He is completed concurrent chemo and radiation in 02/2018. He was last seen by Dr. Lebron Conners on 03/04/18. This is my first visit with him. In interim he was hospitalized for b/l LE edema on 03/16/18.   He presents to the clinic today accompanied by his wife. RN Liliane Channel is here during visit. He notes he felt  swelling and tenderness at back of left neck with initial diagnosis.  Patient notes he is has recovered well after poor toleration with chemo and radiation. He notes he has gotten stronger but still not at baseline yet. [facility] He is still on tube feeding 5 cans. He has starting to eat 1 egg and 1 waffle for breakfast and otherwise he will eat snacks or soups by mouth. He notes he still has no appetite. Sometimes he can eat more. He was able to gain some weight   He still has dysphagia and some food has gotten stuck in his throat.  He notes he has been sleeping on and off due to gargling.  He will see [facility]. Edison Nasuti at [facility] month. He denies having a history of smoking.   He plans to move back to [place] after next few follow ups if his cancer is clear.    REVIEW OF SYSTEMS:   Constitutional: Denies fevers, chills or abnormal weight loss (+) weight gain (+) increased food intake by mouth (+) Improved energy Eyes: Denies blurriness of vision Ears, nose, mouth, throat, and face: Denies mucositis or sore throat (+) improved dysphagia Respiratory: Denies cough, dyspnea or wheezes Cardiovascular: Denies palpitation, chest discomfort or lower extremity swelling Gastrointestinal:  Denies nausea, heartburn or change in bowel habits (+) PEG Tube placed  Skin: Denies abnormal skin rashes Lymphatics: Denies new lymphadenopathy or easy bruising MSK: (+) Limited ROM of neck  Neurological:Denies numbness, tingling or new weaknesses Behavioral/Psych: Mood is stable, no new changes  All other systems were reviewed with the patient and are negative.  MEDICAL HISTORY:  Past Medical History:  Diagnosis Date  . Allergic rhinitis   . Atrial fibrillation ([place])   . Diabetes mellitus without complication ([place])   . 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 HLD (hyperlipidemia) 02/21/2018  .  Hyperlipidemia   . Hypertension   . Prostate cancer ([place])   . T2DM (type 2 diabetes mellitus) ([place]) 02/21/2018    SURGICAL HISTORY: Past Surgical History:  Procedure Laterality Date  . KNEE SURGERY Bilateral    2007 and 1998,   . PROSTATECTOMY  12/15/2006  . SKIN GRAFT     as a child left elbow  . TONSILECTOMY/ADENOIDECTOMY WITH MYRINGOTOMY     as a child    I have reviewed the social history and family history with the patient and they are unchanged from previous note.  ALLERGIES:  has No Known Allergies.  MEDICATIONS:  Current Outpatient Medications  Medication Sig Dispense Refill  . acetaminophen (TYLENOL) 500 MG tablet Take 1,000 mg by mouth every 6 (six) hours as needed for fever.    Marland Kitchen allopurinol (ZYLOPRIM) 100 MG tablet Take 100 mg by mouth daily.    Marland Kitchen apixaban (ELIQUIS) 5 MG TABS tablet Take 5 mg by mouth 2 (two) times daily.    . Artificial Tear Solution (SYSTANE CONTACTS) SOLN Place 1 drop into both eyes daily.     . bisacodyl (DULCOLAX) 5  MG EC tablet Take 1 tablet (5 mg total) by mouth daily as needed for moderate constipation. 30 tablet 0  . colchicine 0.6 MG tablet Take 1 tablet (0.6 mg total) by mouth daily.    . cyanocobalamin 500 MCG tablet Take 500 mcg by mouth 2 (two) times daily.    Marland Kitchen diltiazem (CARDIZEM) 60 MG tablet Take 1 tablet (60 mg total) by mouth every 6 (six) hours.    Marland Kitchen glipiZIDE (GLUCOTROL) 5 MG tablet Take 2.5-5 mg by mouth See admin instructions. Take 1 tablet before breakfast and Take 1/2 tablet before dinner.    Marland Kitchen guaiFENesin-dextromethorphan (ROBITUSSIN DM) 100-10 MG/5ML syrup Take 10 mLs by mouth every 4 (four) hours as needed for cough. 118 mL 0  . metFORMIN (GLUCOPHAGE) 1000 MG tablet Take 1,000 mg by mouth 2 (two) times daily with a meal.    . metoprolol tartrate (LOPRESSOR) 25 MG tablet Place 1 tablet (25 mg total) into feeding tube 2 (two) times daily. 30 tablet 0  . Nutritional Supplements (FEEDING SUPPLEMENT, JEVITY 1.5 CAL/FIBER,) LIQD  Place 1,000 mLs into feeding tube continuous.    . pravastatin (PRAVACHOL) 40 MG tablet Take 40 mg by mouth every evening.     Marland Kitchen scopolamine (TRANSDERM-SCOP) 1 MG/3DAYS Place 1 patch (1.5 mg total) onto the skin every 3 (three) days. 10 patch 0  . sodium fluoride (FLUORISHIELD) 1.1 % GEL dental gel Instill one drop of gel per tooth space of fluoride tray. Place over teeth for 5 minutes. Remove. Spit out excess. Repeat nightly. (Patient taking differently: Place 1 application onto teeth at bedtime. Instill one drop of gel per tooth space of fluoride tray. Place over teeth for 5 minutes. Remove. Spit out excess. Repeat nightly.) 120 mL prn  . traMADol (ULTRAM) 50 MG tablet Place 0.5 tablets (25 mg total) into feeding tube every 12 (twelve) hours as needed for moderate pain. 10 tablet 0  . Amino Acids-Protein Hydrolys (FEEDING SUPPLEMENT, PRO-STAT SUGAR FREE 64,) LIQD Place 30 mLs into feeding tube 3 (three) times daily. 900 mL 0   No current facility-administered medications for this visit.     PHYSICAL EXAMINATION: ECOG PERFORMANCE STATUS: 1 - Symptomatic but completely ambulatory  Vitals:   05/10/18 1600  BP: 135/78  Pulse: 74  Resp: 17  Temp: 98.4 F (36.9 C)  SpO2: 98%   Filed Weights   05/10/18 1600  Weight: 195 lb 9.6 oz (88.7 kg)    GENERAL:alert, no distress and comfortable SKIN: skin color, texture, turgor are normal, no rashes or significant lesions EYES: normal, Conjunctiva are pink and non-injected, sclera clear OROPHARYNX:no exudate, no erythema and lips, buccal mucosa, and tongue normal  NECK: supple, thyroid normal size, non-tender, without nodularity LYMPH:  no palpable lymphadenopathy in the cervical, axillary or inguinal LUNGS: clear to auscultation and percussion with normal breathing effort HEART: regular rate & rhythm and no murmurs and no lower extremity edema ABDOMEN:abdomen soft, non-tender and normal bowel sounds (+) PEG Tube placed  Musculoskeletal:no  cyanosis of digits and no clubbing  NEURO: alert & oriented x 3 with fluent speech, no focal motor/sensory deficits  LABORATORY DATA:  I have reviewed the data as listed CBC Latest Ref Rng & Units 05/10/2018 03/21/2018 03/20/2018  WBC 4.0 - 10.3 K/uL 4.8 6.9 7.5  Hemoglobin 13.0 - 17.1 g/dL 11.0(L) 8.7(L) 8.3(L)  Hematocrit 38.4 - 49.9 % 34.3(L) 28.0(L) 26.5(L)  Platelets 140 - 400 K/uL 169 255 270     CMP Latest Ref Rng & Units  05/10/2018 03/21/2018 03/20/2018  Glucose 70 - 99 mg/dL 129(H) 214(H) 198(H)  BUN 8 - 23 mg/dL 24(H) 53(H) 52(H)  Creatinine 0.61 - 1.24 mg/dL 0.77 0.77 0.90  Sodium 135 - 145 mmol/L 144 141 143  Potassium 3.5 - 5.1 mmol/L 4.5 3.9 4.1  Chloride 98 - 111 mmol/L 104 98(L) 94(L)  CO2 22 - 32 mmol/L 29 36(H) 38(H)  Calcium 8.9 - 10.3 mg/dL 9.5 8.8(L) 9.0  Total Protein 6.5 - 8.1 g/dL 6.5 5.8(L) 6.0(L)  Total Bilirubin 0.3 - 1.2 mg/dL 0.5 0.7 0.5  Alkaline Phos 38 - 126 U/L 79 70 68  AST 15 - 41 U/L 15 51(H) 49(H)  ALT 0 - 44 U/L 16 96(H) 71(H)      RADIOGRAPHIC STUDIES: I have personally reviewed the radiological images as listed and agreed with the findings in the report. No results found.   ASSESSMENT & PLAN:  Lukka Black is a 72 y.o. Caucasian male with a history of HLD, HTN, DM, H/o Prostate Cancer, and Afib.    1. Squamous Cell Carcinoma of base of tongue, stage II, T3N1  -I reviewed his chart extensively, including notes, image findings, pathology and treatments.  -He was felt not to be a candidate for definitive surgery and underwent concurrent chemo and radiation. Due to his advanced age and co-mobiliteis, he was felt not to be a candidate for cisplatin and he received weekly [place] and Taxol. He completed radiation on Mar 07, 2018. -When he was admitted to hospital, he had a CT neck on Feb 25, 2018, which showed no residual laryngeal mass, decreased size of left level 2A cervical lymph nodes, indicating excellent response to treatment. -I discussed his  type of cancer is related to HPV and the overall prognosis is better.  -He is recovering well from treatment, he still on feeding, but eats better, dysphagia and aspiration has near resolved, eating more and he is more active. I encouraged him to exercise his neck as radiation can effect his ROM. -He plans to see ENT Dr. Ardis Hughs through [facility] next month for follow up -I encouraged him to follow up with his dentist regularly.  -Labs reviewed, Hg at 11.0 and lymphocytes at 0.5, otherwise WNL. His blood chemistries are still pending.   -His first restaging PET is scheduled for 9/18 to evaluate his response to chemo and radiation. -He will see Dr. Maylon Peppers in 06/2018 and for continued follow up    2. Dysphagia, PEG Tube -He has PEG tube placed on 01/25/18. He is currently tube feeding 5 cans a day and has increased by mouth eating  -Continue follow up with dietician through [place], he is gaining some weight back   3. Anemia  -Given to chemotherapy, much improved, hemoglobin 11 today.   PLAN:  -PET scan scheduled on 9/18 -Lab and F/u with Dr. Maylon Peppers on 9/20 -Patient knows to call me if he has questions or concerns before his next appointment.  No problem-specific Assessment & Plan notes found for this encounter.   Orders Placed This Encounter  Procedures  . CBC with Differential (Cancer Center Only)    Standing Status:   Future    Standing Expiration Date:   05/11/2019  . CMP ([place] only)    Standing Status:   Future    Standing Expiration Date:   05/11/2019   All questions were answered. The patient knows to call the clinic with any problems, questions or concerns. No barriers to learning was detected. I spent 25 minutes  counseling the patient face to face. The total time spent in the appointment was 30 minutes and more than 50% was on counseling and review of test results     Edgar Merle, MD 05/10/2018 4:40 PM   I, Joslyn Devon, am acting as scribe for Edgar Merle, MD.   I have reviewed the  above documentation for accuracy and completeness, and I agree with the above.

## 2018-06-14 ENCOUNTER — Telehealth: Payer: Self-pay | Admitting: Hematology

## 2018-06-14 NOTE — Telephone Encounter (Signed)
Appts scheduled and patient was notified with date/time per 7/8 staff message

## 2018-06-18 ENCOUNTER — Other Ambulatory Visit: Payer: Self-pay | Admitting: Hematology

## 2018-06-19 NOTE — Progress Notes (Addendum)
Edgar Herrera presents for follow up of radiation completed 03/08/18 to his neck/ base of tongue.   Pain issues, if any: He denies Using a feeding tube?: Yes, 3-4 cartons daily.  Weight changes, if any:  Wt Readings from Last 3 Encounters:  06/28/18 191 lb 6.4 oz (86.8 kg)  05/10/18 195 lb 9.6 oz (88.7 kg)  04/12/18 190 lb 3.2 oz (86.3 kg)   Swallowing issues, if any: He has had 2 swallowing studies at the [place]. They recommended NPO status, due to food getting hung in his throat. He is eating eggs, thickened liquids and other soft foods.  Smoking or chewing tobacco? No Using fluoride trays daily? Yes, daily.  Last ENT visit was on: [facility]18 Other notable issues, if any:  Dr. Maylon Peppers on 07/01/18 06/26/18 PET  He has had recent skin cancer removed to his left neck, and his wife has questions about his radiation field.   BP 125/74 (BP Location: Left Arm, Patient Position: Sitting)   Pulse 72   Temp 98.5 F (36.9 C) (Oral)   Resp 18   Ht 5' 8.5" (1.74 m)   Wt 191 lb 6.4 oz (86.8 kg)   SpO2 95%   BMI 28.68 kg/m

## 2018-06-20 ENCOUNTER — Other Ambulatory Visit: Payer: Self-pay | Admitting: Hematology

## 2018-06-24 ENCOUNTER — Ambulatory Visit: Payer: Self-pay | Admitting: Hematology

## 2018-06-26 ENCOUNTER — Ambulatory Visit (HOSPITAL_COMMUNITY)
Admission: RE | Admit: 2018-06-26 | Discharge: 2018-06-26 | Disposition: A | Discharge status: Home / Self Care | Payer: No Typology Code available for payment source | Source: Ambulatory Visit | Attending: Radiation Oncology | Admitting: Radiation Oncology

## 2018-06-26 DIAGNOSIS — J90 Pleural effusion, not elsewhere classified: Secondary | ICD-10-CM | POA: Insufficient documentation

## 2018-06-26 DIAGNOSIS — C021 Malignant neoplasm of border of tongue: Secondary | ICD-10-CM | POA: Insufficient documentation

## 2018-06-26 DIAGNOSIS — K8020 Calculus of gallbladder without cholecystitis without obstruction: Secondary | ICD-10-CM | POA: Insufficient documentation

## 2018-06-26 DIAGNOSIS — C01 Malignant neoplasm of base of tongue: Secondary | ICD-10-CM

## 2018-06-26 LAB — GLUCOSE, CAPILLARY: GLUCOSE-CAPILLARY: 94 mg/dL (ref 70–99)

## 2018-06-26 MED ORDER — FLUDEOXYGLUCOSE F - 18 (FDG) INJECTION
9.7300 | Freq: Once | INTRAVENOUS | Status: AC
  Administered 2018-06-26: 9.73 via INTRAVENOUS

## 2018-06-27 ENCOUNTER — Other Ambulatory Visit: Payer: Self-pay | Admitting: Hematology

## 2018-06-27 DIAGNOSIS — C01 Malignant neoplasm of base of tongue: Secondary | ICD-10-CM

## 2018-06-28 ENCOUNTER — Other Ambulatory Visit: Payer: Self-pay

## 2018-06-28 ENCOUNTER — Encounter: Payer: Self-pay | Admitting: *Deleted

## 2018-06-28 ENCOUNTER — Ambulatory Visit
Admission: RE | Admit: 2018-06-28 | Discharge: 2018-06-28 | Disposition: A | Discharge status: Home / Self Care | Payer: No Typology Code available for payment source | Source: Ambulatory Visit | Attending: Radiation Oncology | Admitting: Radiation Oncology

## 2018-06-28 ENCOUNTER — Inpatient Hospital Stay: Payer: Medicare Other | Attending: Radiation Oncology

## 2018-06-28 ENCOUNTER — Encounter: Payer: Self-pay | Admitting: Radiation Oncology

## 2018-06-28 VITALS — BP 125/74 | HR 72 | Temp 98.5°F | Resp 18 | Ht 68.5 in | Wt 191.4 lb

## 2018-06-28 DIAGNOSIS — Z79899 Other long term (current) drug therapy: Secondary | ICD-10-CM | POA: Diagnosis not present

## 2018-06-28 DIAGNOSIS — Z7984 Long term (current) use of oral hypoglycemic drugs: Secondary | ICD-10-CM | POA: Diagnosis not present

## 2018-06-28 DIAGNOSIS — E1140 Type 2 diabetes mellitus with diabetic neuropathy, unspecified: Secondary | ICD-10-CM | POA: Diagnosis not present

## 2018-06-28 DIAGNOSIS — Z923 Personal history of irradiation: Secondary | ICD-10-CM | POA: Insufficient documentation

## 2018-06-28 DIAGNOSIS — R1310 Dysphagia, unspecified: Secondary | ICD-10-CM | POA: Diagnosis not present

## 2018-06-28 DIAGNOSIS — I10 Essential (primary) hypertension: Secondary | ICD-10-CM | POA: Insufficient documentation

## 2018-06-28 DIAGNOSIS — D6481 Anemia due to antineoplastic chemotherapy: Secondary | ICD-10-CM | POA: Diagnosis not present

## 2018-06-28 DIAGNOSIS — Z7901 Long term (current) use of anticoagulants: Secondary | ICD-10-CM | POA: Insufficient documentation

## 2018-06-28 DIAGNOSIS — C01 Malignant neoplasm of base of tongue: Secondary | ICD-10-CM

## 2018-06-28 DIAGNOSIS — E46 Unspecified protein-calorie malnutrition: Secondary | ICD-10-CM | POA: Insufficient documentation

## 2018-06-28 DIAGNOSIS — Z931 Gastrostomy status: Secondary | ICD-10-CM | POA: Diagnosis not present

## 2018-06-28 DIAGNOSIS — C029 Malignant neoplasm of tongue, unspecified: Secondary | ICD-10-CM | POA: Diagnosis present

## 2018-06-28 LAB — CMP (CANCER CENTER ONLY)
ALT: 15 U/L (ref 0–44)
AST: 14 U/L — ABNORMAL LOW (ref 15–41)
Albumin: 3.5 g/dL (ref 3.5–5.0)
Alkaline Phosphatase: 84 U/L (ref 38–126)
Anion gap: 10 (ref 5–15)
BUN: 16 mg/dL (ref 8–23)
CO2: 31 mmol/L (ref 22–32)
Calcium: 9.9 mg/dL (ref 8.9–10.3)
Chloride: 103 mmol/L (ref 98–111)
Creatinine: 0.78 mg/dL (ref 0.61–1.24)
GFR, Est AFR Am: >60 mL/min
GFR, Estimated: >60 mL/min
Glucose, Bld: 102 mg/dL — ABNORMAL HIGH (ref 70–99)
Potassium: 4.7 mmol/L (ref 3.5–5.1)
Sodium: 144 mmol/L (ref 135–145)
Total Bilirubin: 0.5 mg/dL (ref 0.3–1.2)
Total Protein: 6.9 g/dL (ref 6.5–8.1)

## 2018-06-28 LAB — CBC WITH DIFFERENTIAL (CANCER CENTER ONLY)
Basophils Absolute: 0 10*3/uL (ref 0.0–0.1)
Basophils Relative: 0 %
Eosinophils Absolute: 0.1 10*3/uL (ref 0.0–0.5)
Eosinophils Relative: 2 %
HCT: 36.6 % — ABNORMAL LOW (ref 38.4–49.9)
Hemoglobin: 11.8 g/dL — ABNORMAL LOW (ref 13.0–17.1)
Lymphocytes Relative: 8 %
Lymphs Abs: 0.5 10*3/uL — ABNORMAL LOW (ref 0.9–3.3)
MCH: 28.2 pg (ref 27.2–33.4)
MCHC: 32.2 g/dL (ref 32.0–36.0)
MCV: 87.6 fL (ref 79.3–98.0)
Monocytes Absolute: 0.8 10*3/uL (ref 0.1–0.9)
Monocytes Relative: 13 %
Neutro Abs: 4.7 10*3/uL (ref 1.5–6.5)
Neutrophils Relative %: 77 %
Platelet Count: 201 10*3/uL (ref 140–400)
RBC: 4.18 MIL/uL — ABNORMAL LOW (ref 4.20–5.82)
RDW: 15.7 % — ABNORMAL HIGH (ref 11.0–14.6)
WBC Count: 6.2 10*3/uL (ref 4.0–10.3)

## 2018-06-28 MED FILL — FLUORISHIELD 1.1% GEL: 1.1 % | 30 days supply | Qty: 114 | Fill #1

## 2018-06-28 NOTE — Progress Notes (Signed)
Radiation Oncology         [phone] ________________________________  Name: Edgar Herrera MRN: [id number]  Date: 06/28/2018  DOB: 09/13/1946  Follow-Up Visit Note  CC: [facility]  Berneice Heinrich, MD  Diagnosis and Prior Radiotherapy:       ICD-10-CM   1. 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 He is accompanied by his wife today. He is doing well overall. He had his port-a-cath removed from his left upper chest.   Since his last visit to the office, he had a restaging PET scan on 06/26/2018 that showed: No evidence of recurrent or metastatic disease. Trace left pleural effusion. Cholelithiasis.  He had some swallowing studies at the [place] and he was advised to have NPO status due to food being caught in his throat. He is having Speech Pathology set up through [facility]. He is still able to eat eggs, thickened liquids, and other soft foods.   Pain issues, if any: None today  Using a feeding tube?: Yes, approximately 3-4 cartons daily.  Weight changes, if any: stable today.  Swallowing issues, if any: He intermittently has food hung in his throat.  Smoking or chewing tobacco? No Using fluoride trays daily? Yes, daily.  Last ENT visit was on: [facility] Sil[facility] Other notable issues, if any: He had squamous cell carcinoma removed from his neck via his Dermatologist, Dr. Michele Mcalpine. He notes that initially it was a large, raised, white, crusting mass.   He has a follow up appointment on 07/09/2018.  He has follow up with Head and Neck Medical Oncologist, Dr. Maylon Peppers on  07/01/18. He also has a follow up with ENT Dr. Ardis Hughs on 06/30/2018.   On review of systems, he has increased oral dryness in the morning only, burning sensation to oral cavity with certain foods. he denies any other symptoms. He will be going to [facility] in the near future.   I personally reviewed the patient's imaging prior to today's visit.                             ALLERGIES:  has No Known Allergies.  Meds: Current Outpatient Medications  Medication Sig Dispense Refill  . acetaminophen (TYLENOL) 500 MG tablet Take 1,000 mg by mouth every 6 (six) hours as needed for fever.    Marland Kitchen allopurinol (ZYLOPRIM) 100 MG tablet Take 100 mg by mouth daily.    Marland Kitchen apixaban (ELIQUIS) 5 MG TABS tablet Take 5 mg by mouth 2 (two) times daily.    . Artificial Tear Solution (SYSTANE CONTACTS) SOLN Place 1 drop into both eyes daily.     . cyanocobalamin 500 MCG tablet Take 500 mcg by mouth 2 (two) times daily.    Marland Kitchen diltiazem (CARDIZEM) 60 MG tablet Take 1 tablet (60 mg total) by mouth every 6 (six) hours.    Marland Kitchen glipiZIDE (GLUCOTROL) 5 MG tablet Take 2.5-5 mg by mouth See admin instructions. Take 1 tablet before breakfast and Take 1/2 tablet before dinner.    Marland Kitchen  metFORMIN (GLUCOPHAGE) 1000 MG tablet Take 1,000 mg by mouth 2 (two) times daily with a meal.    . metoprolol tartrate (LOPRESSOR) 25 MG tablet Place 1 tablet (25 mg total) into feeding tube 2 (two) times daily. 30 tablet 0  . Nutritional Supplements (FEEDING SUPPLEMENT, JEVITY 1.5 CAL/FIBER,) LIQD Place 1,000 mLs into feeding tube continuous.    . pravastatin (PRAVACHOL) 40 MG tablet Take 40 mg by mouth every evening.     . sodium fluoride (FLUORISHIELD) 1.1 % GEL dental gel Instill one drop of gel per tooth space of fluoride tray. Place over teeth for 5 minutes. Remove. Spit out excess. Repeat nightly. (Patient taking differently: Place 1 application onto teeth at bedtime. Instill one drop of gel per tooth space of fluoride tray. Place over  teeth for 5 minutes. Remove. Spit out excess. Repeat nightly.) 120 mL prn  . Amino Acids-Protein Hydrolys (FEEDING SUPPLEMENT, PRO-STAT SUGAR FREE 64,) LIQD Place 30 mLs into feeding tube 3 (three) times daily. (Patient not taking: Reported on 06/28/2018) 900 mL 0  . bisacodyl (DULCOLAX) 5 MG EC tablet Take 1 tablet (5 mg total) by mouth daily as needed for moderate constipation. (Patient not taking: Reported on 06/28/2018) 30 tablet 0  . colchicine 0.6 MG tablet Take 1 tablet (0.6 mg total) by mouth daily. (Patient not taking: Reported on 06/28/2018)    . guaiFENesin-dextromethorphan (ROBITUSSIN DM) 100-10 MG/5ML syrup Take 10 mLs by mouth every 4 (four) hours as needed for cough. (Patient not taking: Reported on 06/28/2018) 118 mL 0  . scopolamine (TRANSDERM-SCOP) 1 MG/3DAYS Place 1 patch (1.5 mg total) onto the skin every 3 (three) days. (Patient not taking: Reported on 06/28/2018) 10 patch 0  . traMADol (ULTRAM) 50 MG tablet Place 0.5 tablets (25 mg total) into feeding tube every 12 (twelve) hours as needed for moderate pain. (Patient not taking: Reported on 06/28/2018) 10 tablet 0   No current facility-administered medications for this encounter.     Physical Findings: The patient is in no acute distress. Patient is alert and oriented. Wt Readings from Last 3 Encounters:  06/28/18 191 lb 6.4 oz (86.8 kg)  05/10/18 195 lb 9.6 oz (88.7 kg)  04/12/18 190 lb 3.2 oz (86.3 kg)    height is 5' 8.5" (1.74 m) and weight is 191 lb 6.4 oz (86.8 kg). His oral temperature is 98.5 F (36.9 C). His blood pressure is 125/74 and his pulse is 72. His respiration is 18 and oxygen saturation is 95%. .  General: Alert and oriented, in no acute distress HEENT: Head is normocephalic. Extraocular movements are intact. Oropharynx is notable for oral cavity and oropharynx clear.  Neck: Neck is notable for no palpable cervical or supraclavicular adenopathy. He has a scar over his left level 2 neck from recent skin cancer  excision which showed squamous cell carcinoma of skin per patient report. In the post-auricular region he has an ulcer where a shave biopsy appears to have been done. Both lesions presented as raised, crusted, lesions per patient/wife.   Skin: Skin in treatment fields shows satisfactory healing in the treatment fields over neck. Lymphatics: see Neck Exam Psychiatric: Judgment and insight are intact. Affect is appropriate.   Lab Findings: Lab Results  Component Value Date   WBC 6.2 06/28/2018   HGB 11.8 (L) 06/28/2018   HCT 36.6 (L) 06/28/2018   MCV 87.6 06/28/2018   PLT 201 06/28/2018    Lab Results  Component Value Date   TSH 3.090 06/28/2018  Radiographic Findings: Nm Pet Image Restag (ps) Skull Base To Thigh  Result Date: 06/26/2018 CLINICAL DATA:  Initial treatment strategy for squamous cell tongue cancer. EXAM: NUCLEAR MEDICINE PET SKULL BASE TO THIGH TECHNIQUE: 9.7 mCi F-18 FDG was injected intravenously. Full-ring PET imaging was performed from the skull base to thigh after the radiotracer. CT data was obtained and used for attenuation correction and anatomic localization. Fasting blood glucose: 94 mg/dl COMPARISON:  CT chest 03/03/2018 and CT neck 02/25/2018. FINDINGS: Mediastinal blood pool activity: SUV max 2.6 NECK: No abnormal hypermetabolism. Incidental CT findings: None. CHEST: Patchy post treatment consolidation in the apices of both upper lobe shows expected hypermetabolism. No hypermetabolic mediastinal, hilar or axillary lymph nodes. No hypermetabolic pulmonary nodules. Incidental CT findings: Coronary artery calcification. Heart is mildly enlarged. No pericardial effusion. Trace left pleural effusion. ABDOMEN/PELVIS: No abnormal hypermetabolism in the liver, adrenal glands, spleen or pancreas. No hypermetabolic lymph nodes. Incidental CT findings: Liver is unremarkable. A stone is seen in the gallbladder. Adrenal glands, kidneys, spleen and pancreas are unremarkable.  Percutaneous gastrostomy is in place. Stomach and bowel are otherwise grossly unremarkable. SKELETON: No abnormal osseous hypermetabolism. Incidental CT findings: Degenerative changes in the spine. IMPRESSION: 1. No evidence of recurrent or metastatic disease. 2. Trace left pleural effusion. 3. Cholelithiasis. Electronically Signed   By: Lorin Picket M.D.   On: 06/26/2018 13:43    Impression/Plan:    1) Head and Neck Cancer Status: NED  2) Nutritional Status: weight is stabilizing, still PEG dependent. Advised that the patient can also try pureed vegetables to supplement his diet.  PEG tube: Using PEG tube for nutrition.  3)  Swallowing: Advised the patient to continue with the swallowing techniques as informed by the Speech therapist. Also advised the patient to maintain follow up with his Speech Language Pathologist at [facility].   4) Dental: Encouraged to continue regular followup with dentistry, and dental hygiene including fluoride rinses. He reports compliance with regular dental visits.   5) Thyroid function: recheck annually in med/onc Lab Results  Component Value Date   TSH 3.090 06/28/2018    6) Other: Maintain follow up with Dr. Ardis Hughs on 06/30/2018 and Dr. Maylon Peppers on 07/01/2018.    Patient and his wife were advised that during his follow up appointment with his Dermatologist, Dr. Michele Mcalpine on 07/09/2018 to have his whole body examined for possible lesions.   Advised for the patient to continue with use of Biotene to aid with dry sensation to oral cavity in the morning along with use of OTC Xylimelt.  7) Follow up in Radiation Oncology PRN. The patient will alternate visits with Dr. Ardis Hughs and Dr. Maylon Peppers. The patient was encouraged to call with any issues or questions before then.  Patient was given an informational document that our care team curated for head and neck cancer survivors to help with symptom management,and overall post-treatment wellness. Phone number of Gayleen Orem, RN, our Head and Neck Oncology Navigator was provided in case any questions arise.  I spent over 15 minutes minutes face to face with the patient and more than 50% of that time was spent in counseling and/or coordination of care.    _____________________________________   [name], MD  This document serves as a record of services personally performed by [name], MD. It was created on her behalf by Steva Colder, a trained medical scribe. The creation of this record is based on the scribe's personal observations and the provider's statements to them. This document  has been checked and approved by the attending provider.

## 2018-06-29 LAB — THYROID PANEL WITH TSH
FREE THYROXINE INDEX: 1.5 (ref 1.2–4.9)
T3 UPTAKE RATIO: 27 % (ref 24–39)
T4, Total: 5.5 ug/dL (ref 4.5–12.0)
TSH: 3.09 u[IU]/mL (ref 0.450–4.500)

## 2018-07-01 ENCOUNTER — Telehealth: Payer: Self-pay | Admitting: Hematology

## 2018-07-01 ENCOUNTER — Encounter: Payer: Self-pay | Admitting: *Deleted

## 2018-07-01 ENCOUNTER — Encounter: Payer: Self-pay | Admitting: Radiation Oncology

## 2018-07-01 ENCOUNTER — Inpatient Hospital Stay: Payer: Medicare Other

## 2018-07-01 ENCOUNTER — Inpatient Hospital Stay (HOSPITAL_BASED_OUTPATIENT_CLINIC_OR_DEPARTMENT_OTHER): Payer: Medicare Other | Admitting: Hematology

## 2018-07-01 ENCOUNTER — Encounter: Payer: Self-pay | Admitting: Hematology

## 2018-07-01 VITALS — BP 129/72 | HR 72 | Temp 98.8°F | Resp 18 | Ht 68.5 in | Wt 190.3 lb

## 2018-07-01 DIAGNOSIS — R1310 Dysphagia, unspecified: Secondary | ICD-10-CM

## 2018-07-01 DIAGNOSIS — D6481 Anemia due to antineoplastic chemotherapy: Secondary | ICD-10-CM

## 2018-07-01 DIAGNOSIS — I10 Essential (primary) hypertension: Secondary | ICD-10-CM

## 2018-07-01 DIAGNOSIS — C01 Malignant neoplasm of base of tongue: Secondary | ICD-10-CM

## 2018-07-01 DIAGNOSIS — E1140 Type 2 diabetes mellitus with diabetic neuropathy, unspecified: Secondary | ICD-10-CM

## 2018-07-01 DIAGNOSIS — T451X5A Adverse effect of antineoplastic and immunosuppressive drugs, initial encounter: Secondary | ICD-10-CM

## 2018-07-01 DIAGNOSIS — E46 Unspecified protein-calorie malnutrition: Secondary | ICD-10-CM | POA: Insufficient documentation

## 2018-07-01 NOTE — Telephone Encounter (Signed)
appts scheduled avs/calendar printed per 9/23 los

## 2018-07-01 NOTE — Assessment & Plan Note (Addendum)
Patient reports that his dysphagia is slowly improving. I encouraged patient to practice swallowing techniques taught by his speech therapist. He is being scheduled for speech therapy in [facility] (referred by his [place] physician).

## 2018-07-01 NOTE — Assessment & Plan Note (Addendum)
PET negative for evidence of residual or recurrent disease in September 2018. Clinically, patient is doing relatively well overall with no suspicious findings on exam.  He will require close follow-up with ENT for disease surveillance, currently scheduled with Dr. Ardis Hughs at the [facility] in November 2019. Given that the initial site of disease is easily accessible via fiberoptic scope by ENT, there is no indication for routine surveillance imaging in the absence of suspicious finding on recent PET scan. I discussed with patient and his wife about concerning symptoms, such as unexplained weight loss, persistent fever, and lymphadenopathy, which should prompt evaluation as soon as possible.

## 2018-07-01 NOTE — Assessment & Plan Note (Signed)
BP is currently well controlled. His antihypertensive medications are currently being given via the PEG tube except diltiazem, which is crushed and taken with apply sauce.

## 2018-07-01 NOTE — Assessment & Plan Note (Signed)
Hemoglobin 11.8 in September 2019, slowly improving compared to prior. Continue to monitor closely, no indication for treatment at this time.

## 2018-07-01 NOTE — Assessment & Plan Note (Signed)
Patient does not monitor his blood glucose routinely at home. His tube feed has been approved by the dietitian from DM standpoint. 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  Overall, the neuropathy in his feet is unchanged. I encouraged patient to follow-up with his primary care physician at the [facility] closely for the management of diabetes.

## 2018-07-01 NOTE — Assessment & Plan Note (Signed)
Overall, the patient's weight has stabilized. He is still dependent on PEG to maintain adequate nutrition. Encourage oral intake as tolerated. Once the patient is is able to achieve adequate p.o. intake, then PEG tube can be removed.

## 2018-07-01 NOTE — Progress Notes (Signed)
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 Clinically, patient is doing relatively well overall with no suspicious findings on exam.  He will require close follow-up with ENT for disease surveillance, currently scheduled with Dr. Ardis Hughs at the [facility] in November 2019. Given that the initial site of disease is easily accessible via fiberoptic scope by ENT, there is no indication for routine surveillance imaging in the absence of suspicious finding on recent PET scan. I discussed with patient and his wife about concerning symptoms, such as unexplained weight loss, persistent fever, and lymphadenopathy, which should prompt evaluation as soon as possible.  Protein malnutrition ([place]) Overall, the patient's weight has stabilized. He is still dependent on PEG to maintain adequate nutrition. Encourage oral intake as tolerated. Once the patient is is able to achieve adequate p.o. intake, then PEG tube can be removed.  Anemia associated with chemotherapy Hemoglobin 11.8 in September 2019, slowly improving compared to prior. Continue to monitor closely, no indication for treatment at this time.  Dysphagia Patient reports that his dysphagia is slowly improving. I encouraged patient to practice swallowing techniques taught by his speech therapist. He is being scheduled for speech therapy in [facility] (referred by his [facility] physician).  T2DM  (type 2 diabetes mellitus) ([place]) Patient does not monitor his blood glucose routinely at home. His tube feed has been approved by the dietitian from DM standpoint. 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  Overall, the neuropathy in his feet is unchanged. I encouraged patient to follow-up with his primary care physician at the [facility] closely for the management of diabetes.  Essential hypertension BP is currently well controlled. His antihypertensive medications are currently being given via the PEG tube except diltiazem, which is crushed and taken with apply sauce.   All questions were answered. The patient knows to call the clinic with any problems, questions or concerns. No barriers to learning was detected.  A total of more than 25 minutes were spent face-to-face with the patient during this encounter and over half of that time was spent on counseling and coordination of care as outlined above.   RTC in 6 months for labs, exam and thyroid function monitoring.   Tish Men, MD 07/01/2018 9:57 AM  CHIEF COMPLAINT: "I am here for my tongue cancer"  INTERVAL HISTORY: Edgar Herrera returns to clinic for follow-up with his wife today.  He reports that he is using 3-4 cans of tube feed via PEG daily and able to maintain his weight.  His ability to swallow solid food has slowly improved over time and was able to eat a small amount of steak last night without choking or coughing.  However, he still has some difficulty drinking liquids.  He was evaluated by speech pathology in July at the [place], which showed persistent dysphagia.  He was reevaluated in early September for swallow function, which was relatively unchanged.  In addition, he reports mild persistent decrease in appetite, but otherwise denies any fever, chills, lymphadenopathy,  chest pain or dyspnea.  He has mild neuropathy in bilateral feet secondary to diabetes, which predates his chemotherapy, and it remains  unchanged.  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[place])   12/17/2017 Initial Diagnosis    Squamous cell carcinoma of base of tongue ([place]):  -- laryngoscopic biopsy of the mass at the base of the tongue base positive for invasive poorly differentiated carcinoma positive for p40 and p16    01/01/2018 Cancer Staging    Staging form: Pharynx - HPV-Mediated Oropharynx, AJCC 8th Edition - Clinical: Stage II (cT3, cN1, cM0, p16+) - Signed by [name], MD on 01/01/2018     01/16/2018 - 03/08/2018 Radiation Therapy    HN_BOT and bilateral neck/ 70 Gy delivered in 35 fractions of 2 Gy to gross disease, lower doses to empiric nodes, under Dr. Isidore Moos      01/18/2018 - 02/15/2018 Chemotherapy    Carboplatin AUC 2, d1 + paclitaxel 45mg /m2, d1 QWk --Week #1, 01/18/18: --Week #2, 01/24/18: --Week #3, 02/01/18: --Week #4, 02/08/18: --Week #5, 02/15/18:     06/26/2018 Imaging    PET: 1. No evidence of recurrent or metastatic disease. 2. Trace left pleural effusion. 3. Cholelithiasis.     REVIEW OF SYSTEMS:   Constitutional: ( - ) fevers, ( - )  chills , ( - ) night sweats Eyes: ( - ) blurriness of vision, ( - ) double vision, ( - ) watery eyes Ears, nose, mouth, throat, and face: negative except mentioned in history Respiratory: ( - ) cough, ( - ) dyspnea, ( - ) wheezes Cardiovascular: ( - ) palpitation, ( - ) chest discomfort, ( - ) lower extremity swelling Gastrointestinal:  ( - ) nausea, ( - ) heartburn, ( - ) change in bowel habits Skin: ( - ) abnormal skin rashes Lymphatics: ( - ) new lymphadenopathy, ( - ) easy bruising Neurological: ( - ) numbness, ( + ) stable tingling in both feet, ( - ) new  weaknesses Behavioral/Psych: ( - ) mood change, ( - ) new changes  All other systems were reviewed with the patient and are negative.  I have reviewed the past medical history, past surgical history, social history and family history with the patient and they are unchanged from previous note.  ALLERGIES:  has No Known Allergies.  MEDICATIONS:  Current Outpatient Medications  Medication Sig Dispense Refill  . acetaminophen (TYLENOL) 500 MG tablet Take 1,000 mg by mouth every 6 (six) hours as needed for fever.    Marland Kitchen allopurinol (ZYLOPRIM) 100 MG tablet Take 100 mg by mouth daily.    Marland Kitchen apixaban (ELIQUIS) 5 MG TABS tablet Take 5 mg by mouth 2 (two) times daily.    . Artificial Tear Solution (SYSTANE CONTACTS) SOLN Place 1 drop into both eyes daily.     . cyanocobalamin 500 MCG tablet Take 500 mcg by mouth 2 (two) times daily.    Marland Kitchen diltiazem (CARDIZEM) 60 MG tablet Take 1 tablet (60 mg total) by mouth every 6 (six) hours.    Marland Kitchen glipiZIDE (GLUCOTROL) 5 MG tablet Take 2.5-5 mg by mouth See admin instructions. Take 1 tablet before breakfast and Take 1/2 tablet before dinner.    . metFORMIN (  GLUCOPHAGE) 1000 MG tablet Take 1,000 mg by mouth 2 (two) times daily with a meal.    . metoprolol tartrate (LOPRESSOR) 25 MG tablet Place 1 tablet (25 mg total) into feeding tube 2 (two) times daily. 30 tablet 0  . Nutritional Supplements (FEEDING SUPPLEMENT, JEVITY 1.5 CAL/FIBER,) LIQD Place 1,000 mLs into feeding tube continuous.    . pravastatin (PRAVACHOL) 40 MG tablet Take 40 mg by mouth every evening.     . sodium fluoride (FLUORISHIELD) 1.1 % GEL dental gel Instill one drop of gel per tooth space of fluoride tray. Place over teeth for 5 minutes. Remove. Spit out excess. Repeat nightly. (Patient taking differently: Place 1 application onto teeth at bedtime. Instill one drop of gel per tooth space of fluoride tray. Place over teeth for 5 minutes. Remove. Spit out excess. Repeat nightly.) 120 mL prn  . bisacodyl  (DULCOLAX) 5 MG EC tablet Take 1 tablet (5 mg total) by mouth daily as needed for moderate constipation. (Patient not taking: Reported on 06/28/2018) 30 tablet 0  . colchicine 0.6 MG tablet Take 1 tablet (0.6 mg total) by mouth daily. (Patient not taking: Reported on 06/28/2018)    . guaiFENesin-dextromethorphan (ROBITUSSIN DM) 100-10 MG/5ML syrup Take 10 mLs by mouth every 4 (four) hours as needed for cough. (Patient not taking: Reported on 06/28/2018) 118 mL 0  . scopolamine (TRANSDERM-SCOP) 1 MG/3DAYS Place 1 patch (1.5 mg total) onto the skin every 3 (three) days. (Patient not taking: Reported on 06/28/2018) 10 patch 0  . traMADol (ULTRAM) 50 MG tablet Place 0.5 tablets (25 mg total) into feeding tube every 12 (twelve) hours as needed for moderate pain. (Patient not taking: Reported on 06/28/2018) 10 tablet 0   No current facility-administered medications for this visit.     PHYSICAL EXAMINATION: ECOG PERFORMANCE STATUS: 1 - Symptomatic but completely ambulatory  Vitals:   07/01/18 0900  BP: 129/72  Pulse: 72  Resp: 18  Temp: 98.8 F (37.1 C)  SpO2: 96%   Filed Weights   07/01/18 0900  Weight: 190 lb 4.8 oz (86.3 kg)    GENERAL: alert, no distress and comfortable SKIN: skin color, texture, turgor are normal, no rashes or significant lesions EYES: conjunctiva are pink and non-injected, sclera clear OROPHARYNX: no exudate, no erythema; lips, buccal mucosa, and tongue normal  NECK: supple, non-tender LYMPH:  no palpable lymphadenopathy in the cervical or axillary area LUNGS: clear to auscultation and percussion with normal breathing effort HEART: regular rate & rhythm and no murmurs and no lower extremity edema ABDOMEN: soft, non-tender, non-distended, normal bowel sounds, PEG tube intact without underlying skin erythema or drainage Musculoskeletal: no cyanosis of digits and no clubbing  PSYCH: alert & oriented x 3, fluent speech NEURO: no focal motor/sensory deficits  LABORATORY  DATA:  I have reviewed the data as listed    Component Value Date/Time   NA 144 06/28/2018 1104   K 4.7 06/28/2018 1104   CL 103 06/28/2018 1104   CO2 31 06/28/2018 1104   GLUCOSE 102 (H) 06/28/2018 1104   BUN 16 06/28/2018 1104   CREATININE 0.78 06/28/2018 1104   CALCIUM 9.9 06/28/2018 1104   PROT 6.9 06/28/2018 1104   ALBUMIN 3.5 06/28/2018 1104   AST 14 (L) 06/28/2018 1104   ALT 15 06/28/2018 1104   ALKPHOS 84 06/28/2018 1104   BILITOT 0.5 06/28/2018 1104   GFRNONAA >60 06/28/2018 1104   GFRAA >60 06/28/2018 1104    No results found for: SPEP, UPEP  Lab Results  Component Value Date   WBC 6.2 06/28/2018   NEUTROABS 4.7 06/28/2018   HGB 11.8 (L) 06/28/2018   HCT 36.6 (L) 06/28/2018   MCV 87.6 06/28/2018   PLT 201 06/28/2018      Chemistry      Component Value Date/Time   NA 144 06/28/2018 1104   K 4.7 06/28/2018 1104   CL 103 06/28/2018 1104   CO2 31 06/28/2018 1104   BUN 16 06/28/2018 1104   CREATININE 0.78 06/28/2018 1104      Component Value Date/Time   CALCIUM 9.9 06/28/2018 1104   ALKPHOS 84 06/28/2018 1104   AST 14 (L) 06/28/2018 1104   ALT 15 06/28/2018 1104   BILITOT 0.5 06/28/2018 1104       RADIOGRAPHIC STUDIES: I have personally reviewed the radiological images as listed and agreed with the findings in the report. Nm Pet Image Restag (ps) Skull Base To Thigh  Result Date: 06/26/2018 CLINICAL DATA:  Initial treatment strategy for squamous cell tongue cancer. EXAM: NUCLEAR MEDICINE PET SKULL BASE TO THIGH TECHNIQUE: 9.7 mCi F-18 FDG was injected intravenously. Full-ring PET imaging was performed from the skull base to thigh after the radiotracer. CT data was obtained and used for attenuation correction and anatomic localization. Fasting blood glucose: 94 mg/dl COMPARISON:  CT chest 03/03/2018 and CT neck 02/25/2018. FINDINGS: Mediastinal blood pool activity: SUV max 2.6 NECK: No abnormal hypermetabolism. Incidental CT findings: None. CHEST: Patchy  post treatment consolidation in the apices of both upper lobe shows expected hypermetabolism. No hypermetabolic mediastinal, hilar or axillary lymph nodes. No hypermetabolic pulmonary nodules. Incidental CT findings: Coronary artery calcification. Heart is mildly enlarged. No pericardial effusion. Trace left pleural effusion. ABDOMEN/PELVIS: No abnormal hypermetabolism in the liver, adrenal glands, spleen or pancreas. No hypermetabolic lymph nodes. Incidental CT findings: Liver is unremarkable. A stone is seen in the gallbladder. Adrenal glands, kidneys, spleen and pancreas are unremarkable. Percutaneous gastrostomy is in place. Stomach and bowel are otherwise grossly unremarkable. SKELETON: No abnormal osseous hypermetabolism. Incidental CT findings: Degenerative changes in the spine. IMPRESSION: 1. No evidence of recurrent or metastatic disease. 2. Trace left pleural effusion. 3. Cholelithiasis. Electronically Signed   By: Lorin Picket M.D.   On: 06/26/2018 13:43

## 2018-07-04 ENCOUNTER — Encounter: Payer: Self-pay | Admitting: *Deleted

## 2018-07-04 NOTE — Progress Notes (Signed)
Oncology Nurse Navigator Documentation  To provide support, encouragement and care continuity, met with Edgar Herrera and Edgar Herrera during f/u appt with Dr. Isidore Moos.  He completed chemoRT for p16 pos BOT ISCC 03/08/18. He reported:  "Failed" swallowing study at [place] 2 weeks ago; occasionally aspirates fluids, food "gets stuck" occasionally.    Conducting some of the HEP as instructed by SLP.  Dysgeusia variable, able to taste some foods, eg eggs.  Uses thickener for fluids, judicially selects food to eat.  Using PEG, PAC has been removed.  Weight stable.  Using Biotene for xerostomia. They voiced understanding of unremarkable results for re-staging PET. I confirmed their attendance next Tuesday for the start of the Fall [facility] series. Encouraged them to call me with needs/concerns.  Navigator Interventions  Encouraged return to BID HEP in order to improve swallowing function.  Gayleen Orem, RN, BSN Head & Neck Oncology Nurse [place] at [place] [phone]

## 2018-07-04 NOTE — Progress Notes (Signed)
A user error has taken place: encounter opened in error, closed for administrative reasons.

## 2018-07-15 ENCOUNTER — Telehealth: Payer: Self-pay | Admitting: *Deleted

## 2018-07-15 NOTE — Telephone Encounter (Addendum)
Oncology Nurse Navigator Documentation  Spoke with Dr. Weston Anna, [facility] ENT.  Informed her of 10/8 H&N TB discussion re his 06/26/18 re-staging PET:  "evidence of hypermetabolic activity in the R lateral posterior tongue, contralateral to the large tongue base tumor on the left seen on prior CT of 11/29/2017" for which he was treated.   Noted report comments:  "Uptake could represent physiologic uptake however is asymmetric and could represent tumor or inflammation. Direct visualization recommended. No residual activity in the left tongue base on PET."  She indicated she would could see him this Thursday, will call to schedule appt.  I indicated I would have PET imaging transferred to disc and provide report, will give to Mr. Garabedian at [facility] Tuesday evening.  She voiced appreciation.  Gayleen Orem, RN, BSN Head & Neck Oncology Nurse [place] at [place] [phone]

## 2018-08-15 ENCOUNTER — Telehealth: Payer: Self-pay | Admitting: *Deleted

## 2018-08-15 NOTE — Telephone Encounter (Signed)
Oncology Nurse Navigator Documentation  Received call from Mr. Edgar Herrera.  He stated:  Met with [facility] PCP Dr. Lamont Snowball last week 11/5 for routine follow-up. Results of "several blood counts low", he was advised to follow-up with [facility] Oncologist (Dr. Maylon Peppers).  He will mail me copy of lab results.  Pre-authorization needed for extension of [facility] services. I noted I will move forward with request for [facility] pre-authorization and schedule ASAP appt with Dr. Maylon Peppers, currently scheduled for 01/22/19, when received.  He voiced understanding.  [employer] [employer] Advocate Meredith Hobgood and Dr. Maylon Peppers updated.  Gayleen Orem, RN, BSN Head & Neck Oncology Nurse [place] at [place] [phone]

## 2018-08-31 IMAGING — DX DG CHEST 1V PORT
1 series · 1 of 1 positions shown · non-contrast
Comparison: Outside PET-CT 12/24/2017.

CLINICAL DATA: Throat cancer.  Coughing up red phlegm.

EXAM:
PORTABLE CHEST 1 VIEW

[chest ap]
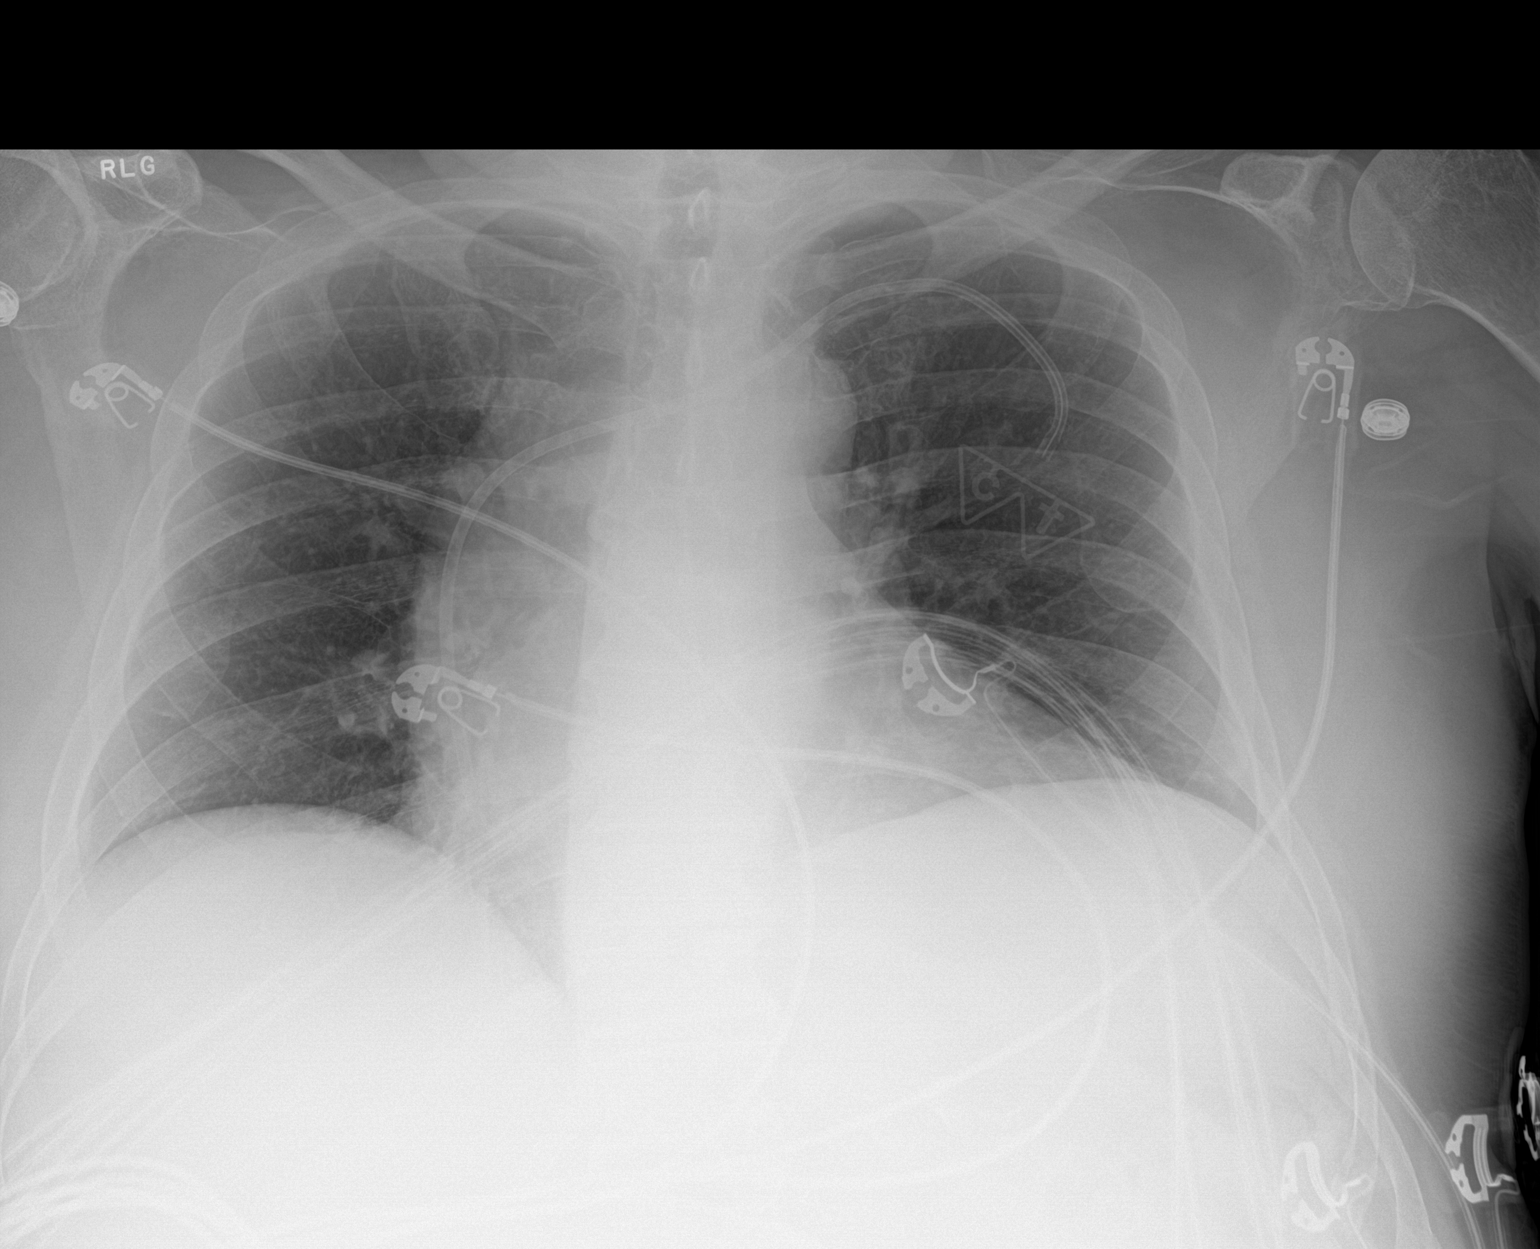

[1 of 1 positions shown; findings below may reference images not displayed]

FINDINGS: Left subclavian PowerPort catheter noted with tip in the right
atrium. Cardiomegaly with normal pulmonary vascularity. No focal
infiltrate. No pleural effusion pneumothorax. Mild elevation left
hemidiaphragm. No acute bony abnormality. Mediastinum and hilar
structures are normal.
IMPRESSION: One PowerPort catheter noted with tip over the right atrium.

2.  Cardiomegaly.  No pulmonary venous congestion.

3.  No acute pulmonary disease.

## 2018-09-11 IMAGING — DX DG CHEST 1V PORT
1 series · 1 of 1 positions shown · non-contrast
Comparison: 03/03/2018

CLINICAL DATA: Shortness of breath

EXAM:
PORTABLE CHEST 1 VIEW

[chest ap]
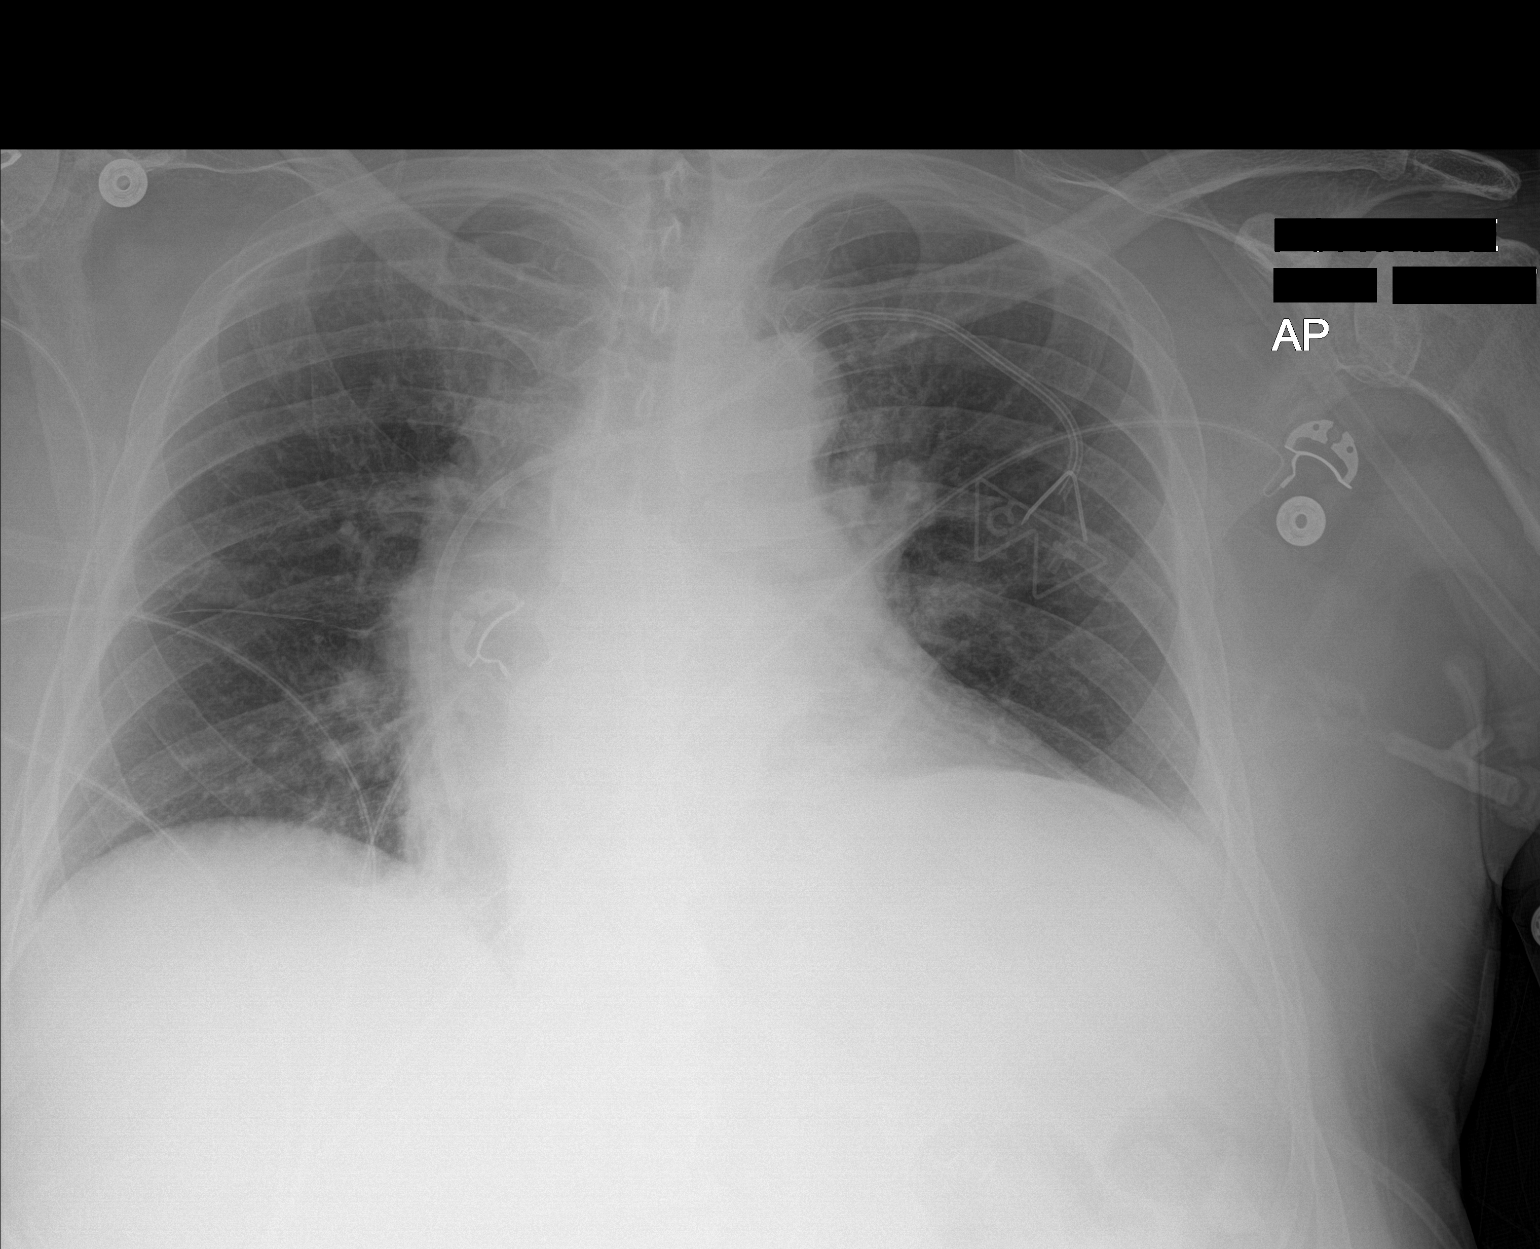

[1 of 1 positions shown; findings below may reference images not displayed]

FINDINGS: Shallow inspiration. Cardiac enlargement. No vascular congestion,
edema, or consolidation. No blunting of costophrenic angles. No
pneumothorax. Power port type central venous catheter with tip over
the cavoatrial junction region. Tortuous aorta.
IMPRESSION: Shallow inspiration. Cardiac enlargement. No active pulmonary
disease.

## 2018-09-15 IMAGING — DX DG CHEST 1V PORT
1 series · 1 of 1 positions shown · non-contrast
Comparison: One-view chest x-ray 03/04/2018

CLINICAL DATA: Dyspnea.

EXAM:
PORTABLE CHEST 1 VIEW

[chest ap]
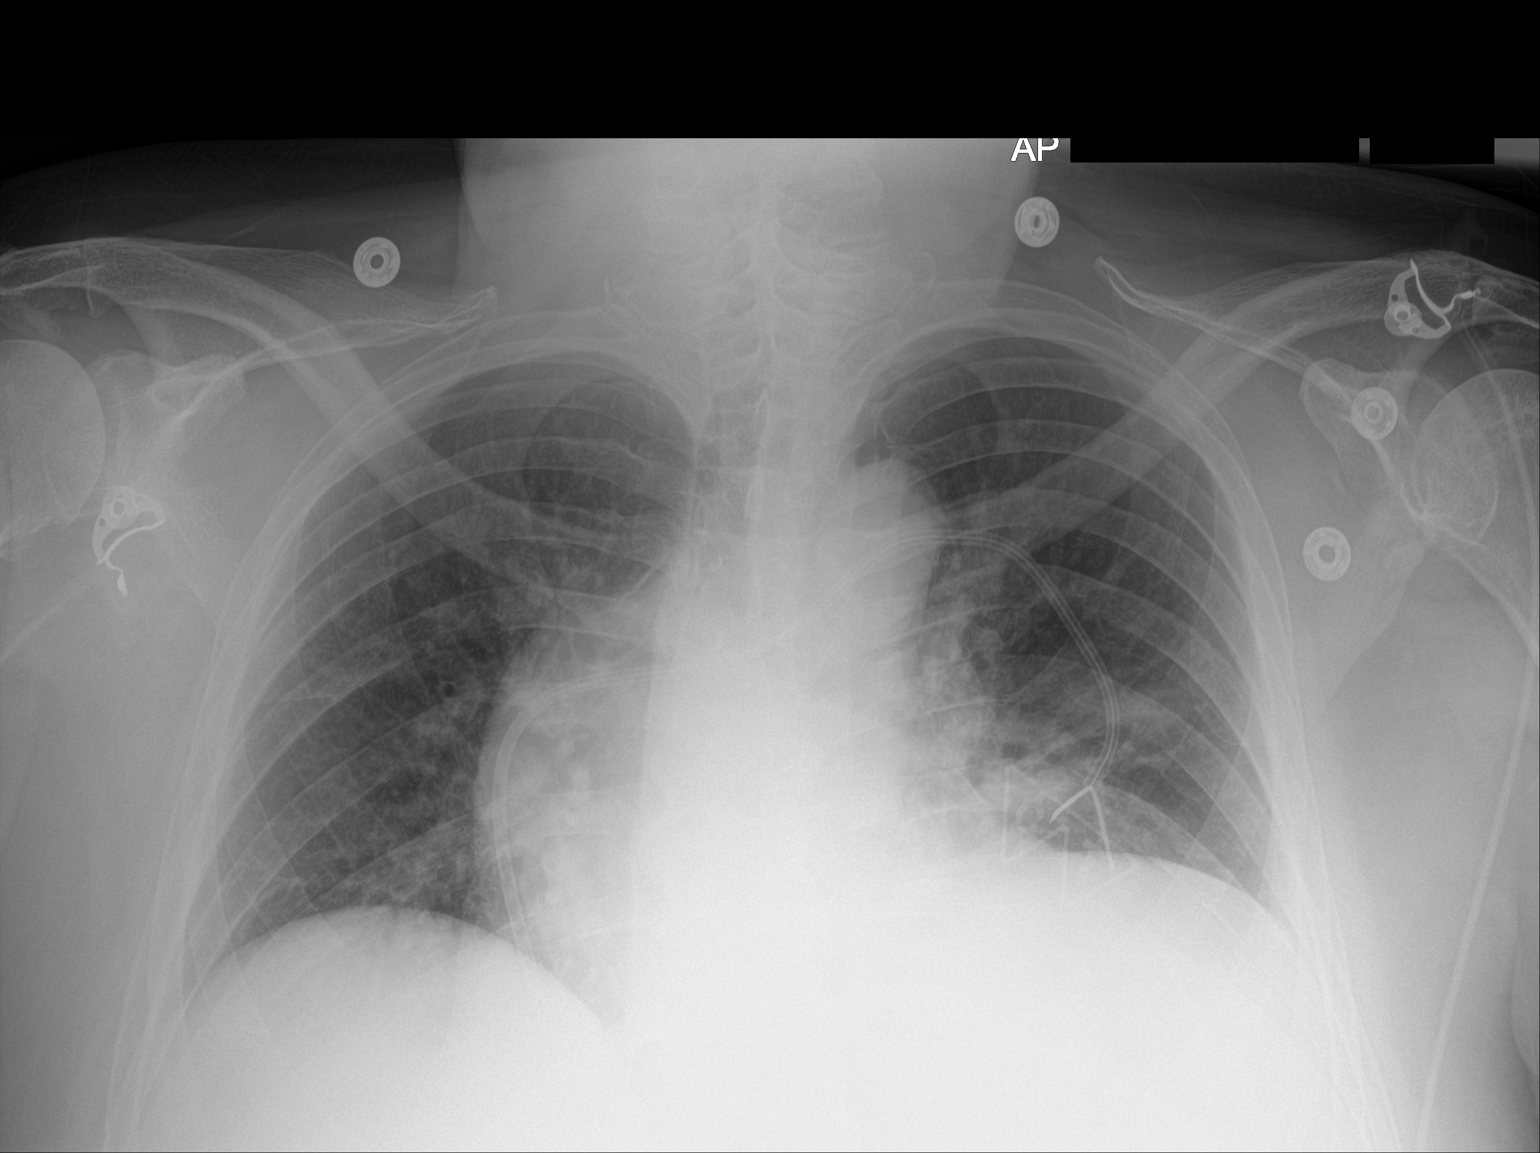

[1 of 1 positions shown; findings below may reference images not displayed]

FINDINGS: The heart size is exaggerated by low lung volumes. A left subclavian
Port-A-Cath is in place. Lung volumes are low. Progressive left
lower lobe airspace disease is present. There is no edema or
effusion.
IMPRESSION: 1. Developing lingular or left lower lobe airspace disease is
concerning for infection or aspiration.
2. Low lung volumes.
3. Left subclavian Port-A-Cath.

## 2018-09-23 IMAGING — CT CT HEAD W/O CM
3 series · 14 of 47 positions shown, 16 images · non-contrast
Comparison: None.

CLINICAL DATA: Lower extremity weakness. History of throat
carcinoma

EXAM:
CT HEAD WITHOUT CONTRAST
TECHNIQUE: Contiguous axial images were obtained from the base of the skull
through the vertex without intravenous contrast.

[Series 2: head wo · axial · 0.47mm/px · z∈[-140,-5]mm · 8 of 33 slices shown, 10 images]
[im 3/33  brain]
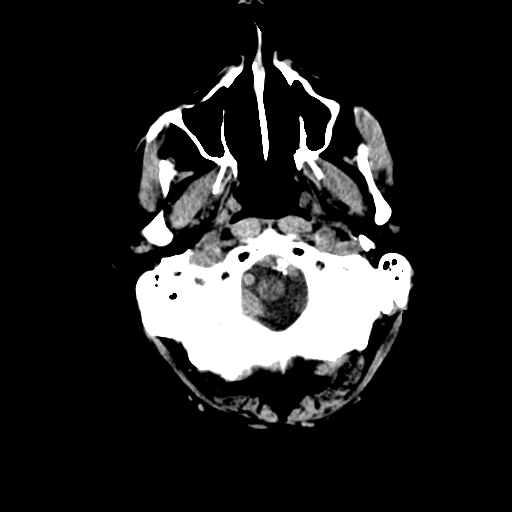
[im 3/33  bone]
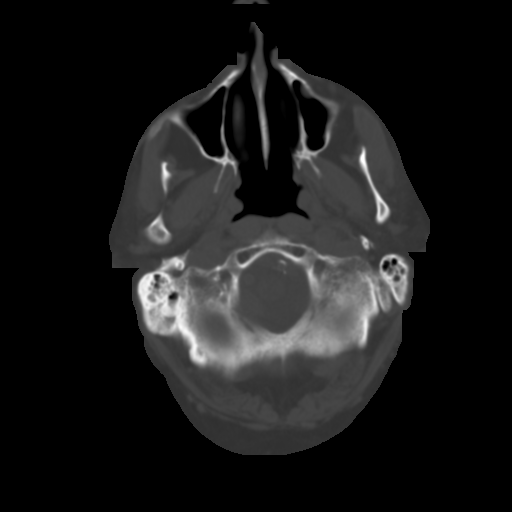
[im 7/33  brain]
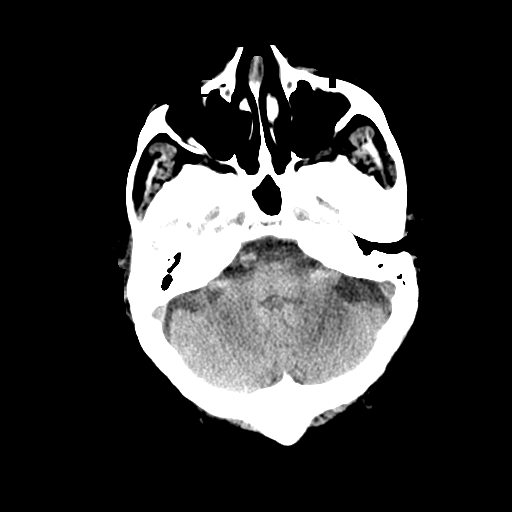
[im 10/33  brain]
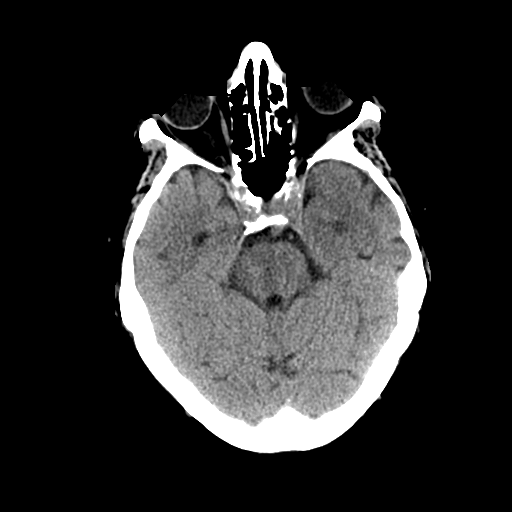
[im 15/33  brain]
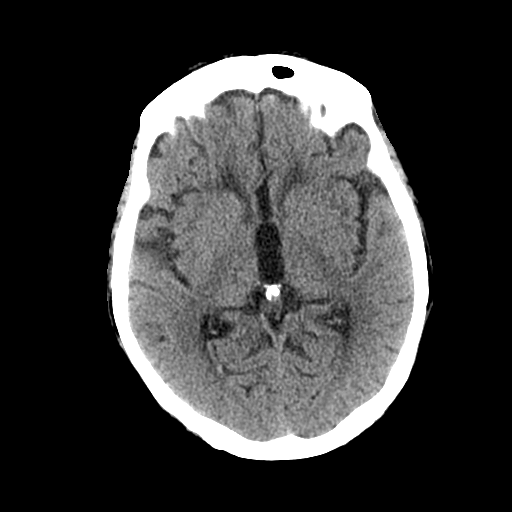
[im 18/33  brain]
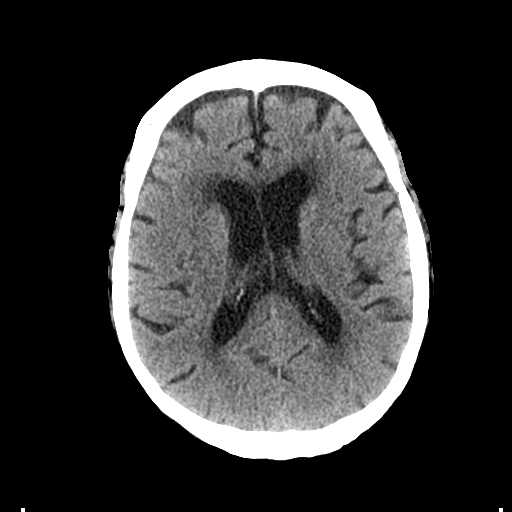
[im 18/33  bone]
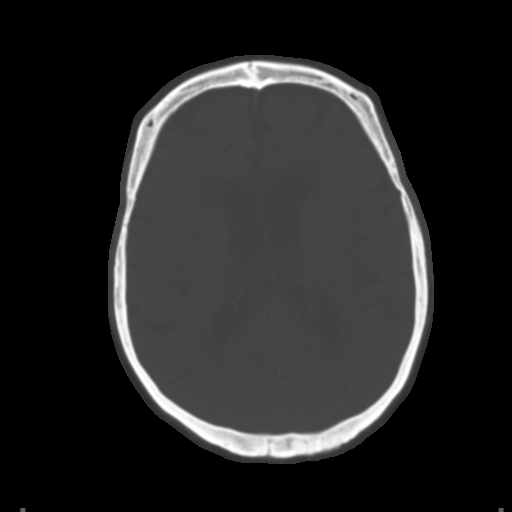
[im 23/33  brain]
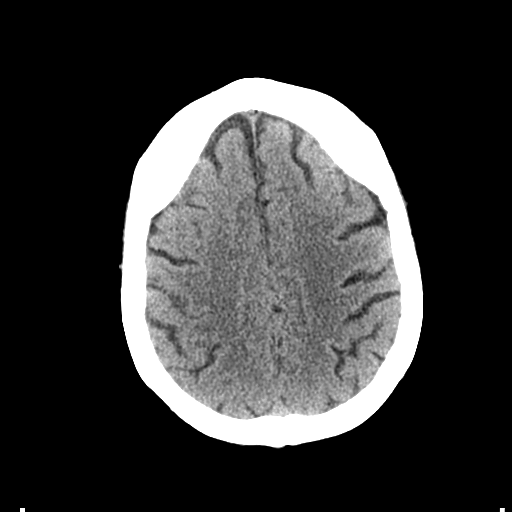
[im 26/33  brain]
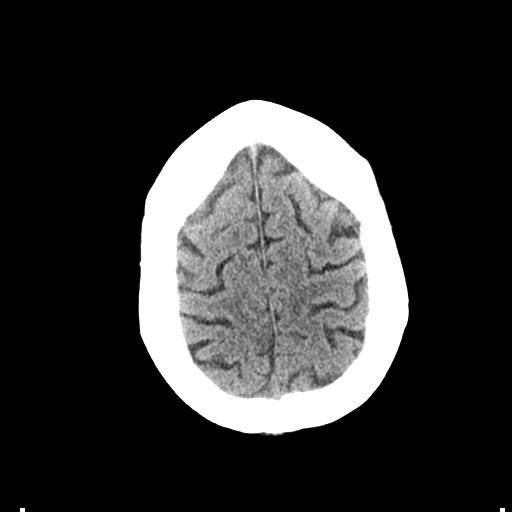
[im 30/33  brain]
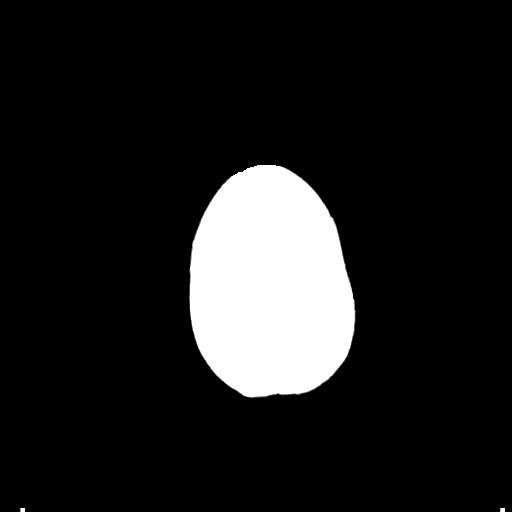

[Series 5: coronal soft tissue · coronal · 0.32mm/px · 3 of 84 slices shown]
[im 28/84  brain]
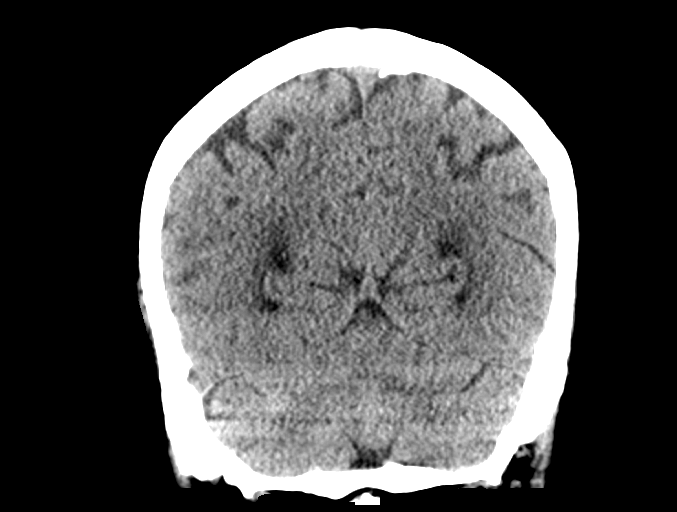
[im 37/84  brain]
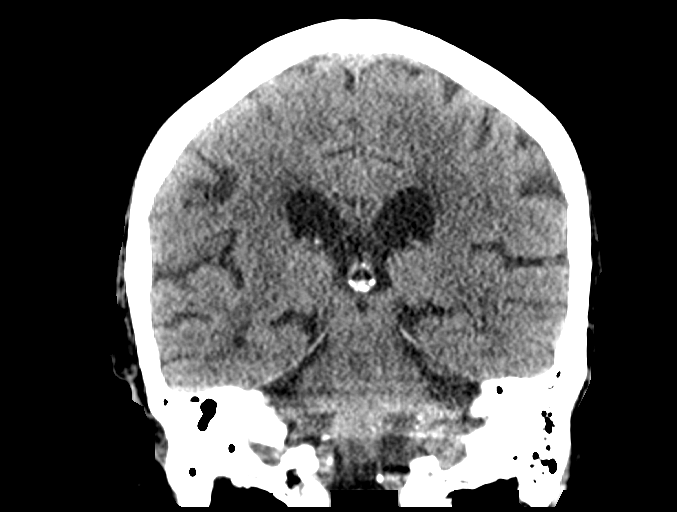
[im 47/84  brain]
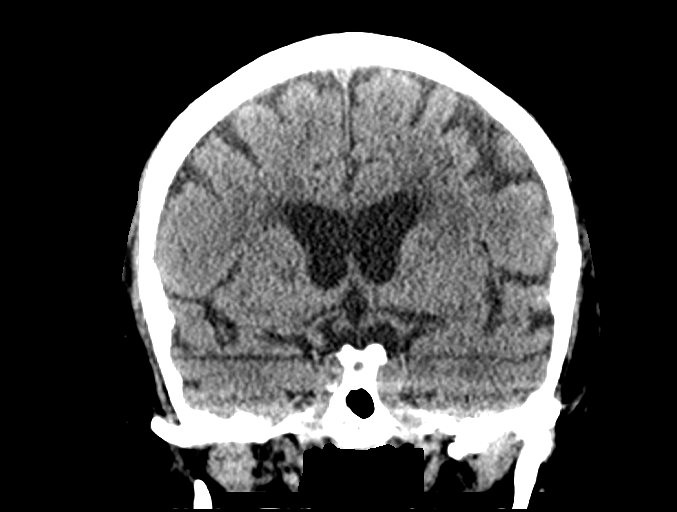

[Series 6: sagittal soft tissue · sagittal · 0.33mm/px · 3 of 79 slices shown]
[im 27/79  brain]
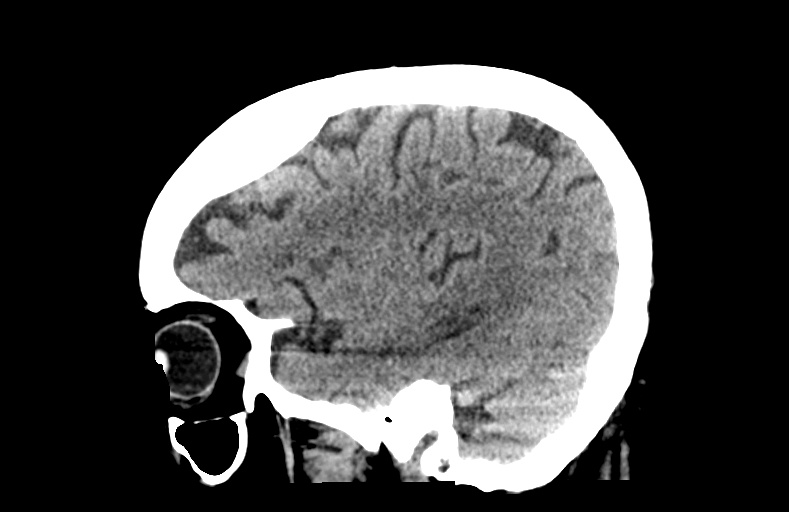
[im 40/79  brain]
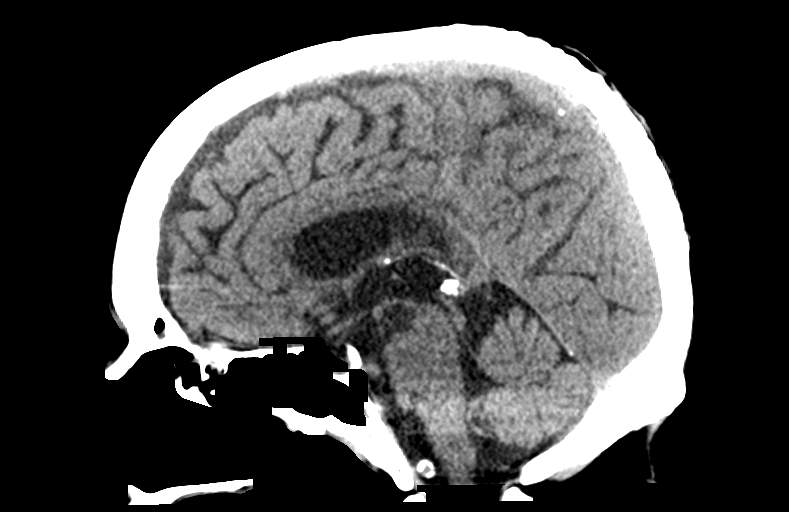
[im 53/79  brain]
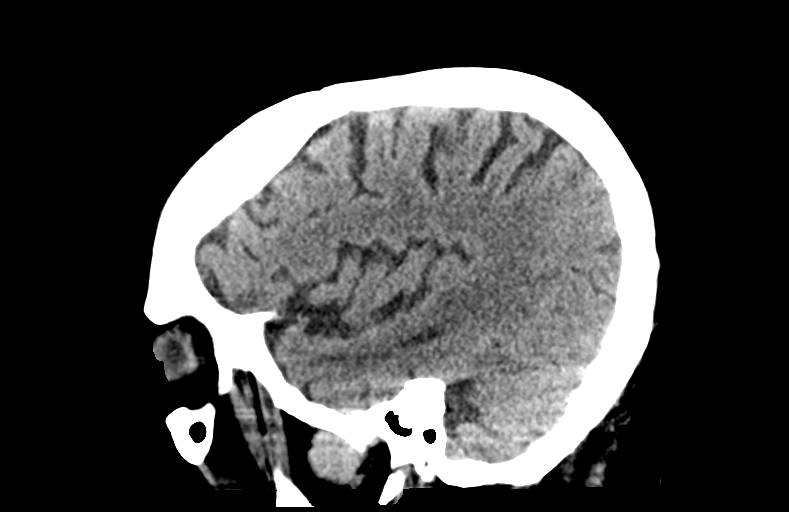

[14 of 47 positions shown; findings below may reference images not displayed]

FINDINGS: Brain: There is moderate diffuse atrophy. There is no intracranial
mass, hemorrhage, extra-axial fluid collection, or midline shift.
There is patchy small vessel disease throughout the centra semiovale
bilaterally. Elsewhere gray-white compartments appear normal. No
evident acute infarct.

Vascular: No hyperdense vessels. There is calcification in each
carotid siphon as well as in the distal vertebral arteries.

Skull: Bony calvarium appears intact.

Sinuses/Orbits: There is a small retention cyst in the anterior left
maxillary antrum. There is mucosal thickening in several ethmoid air
cells. There is opacification in a somewhat hypoplastic left frontal
sinus. Orbits appear symmetric bilaterally.

Other: There is opacification multiple mastoid air cells
bilaterally.
IMPRESSION: Atrophy with patchy periventricular small vessel disease. No evident
acute infarct. No mass or hemorrhage.

There are foci of arterial vascular calcification. Areas of
paranasal sinus and mastoid disease noted.

## 2018-12-09 ENCOUNTER — Telehealth: Payer: Self-pay | Admitting: *Deleted

## 2018-12-09 ENCOUNTER — Telehealth: Payer: Self-pay | Admitting: Hematology

## 2018-12-09 NOTE — Telephone Encounter (Signed)
Oncology Nurse Navigator Documentation  Returned pt's call.  He requested 4/15 lab and Est Pt Maylon Peppers be rescheduled s/p 4/16 PET to be conducted at [place].  I indicated appts will be rescheduled for 4/22.  He voiced understanding.  Gayleen Orem, RN, BSN Head & Neck Oncology Nurse [place] at [place] [phone]

## 2018-12-09 NOTE — Telephone Encounter (Signed)
Scheduled appt per 3/2 sch message - pt is aware of appt date and time   

## 2019-01-06 NOTE — Therapy (Signed)
[facility] [address] [place], [place], [address] Phone: [phone]   Fax:  [phone]  Patient Details  Name: Edgar Herrera MRN: [id number] Date of Birth: 07/14/1946 Referring Provider:  [name], MS  Encounter Date: 01/06/2019  SPEECH THERAPY DISCHARGE SUMMARY  Visits from [facility] of Care: 2  Current functional level related to goals / functional outcomes: Pt was last seen in early June 2019, and was called to schedule further appointments but when called, said he preferred to be seen for ST  in [place], and was making arrangements for this on his own.  Notes and goals from his last attended therapy are below:  Dysphagia Treatment Temperature Spikes Noted  Yes, but not currently - pt just d/c'd from hospital    Respiratory Status  Room air   Treatment Methods  Skilled observation;Therapeutic exercise;Patient/caregiver education   Patient observed directly with PO's  No had not performed oral care within 30 minutes of ST   Type of cueing  Verbal;Visual   Amount of cueing  Moderate-max, consistent   Other treatment/comments  Pt did not perform oral care within 30 imnutes of ST so no POs observed today. SLP reviewed parameters around the ice chips (within 30 minutes of thorough oral care, swallow hard, no more that what he could tolerate -strength-wise). HEP was completed routinely in first 3 weeks of rad tx, however in the last 3-4 weeks pt was only able to do/only did HEP minimally. SLP required to provide max cues consistently for appropriate completion. SLP educated pt/wife on overt s/s aspiration PNA, and how to stagger the swallowing/non-swallowing HEP exercises and/or to cycle through all exercises with 1-2 reps and then cycle back through   SLP Short Term Goals - 03/11/18 1448              SLP SHORT TERM GOAL #1    Title  pt will complete HEP with rare min A      Time  1     Period  -- visits (visit  #3)     Status  On-going          SLP SHORT TERM GOAL #2    Title  pt will tell SLP why he is completing HEP      Time  --     Period  --     Status  Achieved          SLP SHORT TERM GOAL #3    Title  pt will demo head turn to lt with solids, if necessary, with rare min A     Time  1     Period  -- visits (visit #2)     Status  Deferred pt is tube dependent currently (03-11-18)          SLP SHORT TERM GOAL #4    Title  pt will tell SLP 3 overt s/s aspiration PNA with modified independence     Time  --     Period  --     Status  Achieved              SLP Long Term Goals - 03/11/18 1549              SLP LONG TERM GOAL #1    Title  demo HEP with rare min A     Time  2     Period  -- visits (visit #4)     Status  Revised  SLP LONG TERM GOAL #2    Title  pt will tell SLP when HEP frequency can be reduced to x2-3/week     Time  2     Period  -- visits (visit #4)     Status  On-going              Plan - 03/11/18 1545     Clinical Impression Statement  Pt is NPO currently due to needing to obtain nutrition/hydration via non-oral means at this time due to side effects from rad tx. The probability of swallowing difficulty increases dramatically with the completion of chemo and radiation therapy. Pt will need to be followed by SLP for regular assessment of accurate HEP completion as well as for safety with POs both during and following treatment/s.     Speech Therapy Frequency  -- once approx ever 4 weeks     Duration  -- 3 sessions         Remaining deficits: Unknown as pt not seen since June 2019.   Education / Equipment: HEP procedure, swallow precautions, precautions for performing POs with ice chips   Plan: Patient agrees to discharge.  Patient goals were partially met. Patient is being discharged due to the patient's request.  ?????        [facility] ,MS, CCC-SLP  01/06/2019, 10:55 AM  [facility] [address] [place], [place], [address] Phone: [phone]   Fax:  [phone]

## 2019-01-22 ENCOUNTER — Ambulatory Visit: Payer: Self-pay | Admitting: Hematology

## 2019-01-22 ENCOUNTER — Other Ambulatory Visit: Payer: Self-pay

## 2019-01-22 ENCOUNTER — Telehealth: Payer: Self-pay | Admitting: *Deleted

## 2019-01-22 NOTE — Telephone Encounter (Signed)
Oncology Nurse Navigator Documentation  Spoke with Mr and Mrs Commons, asked them for update of [facility].   They indicated he was seen by Dr Edison Nasuti, [place] late October 2019 s/p my 07/15/18 call to her; had follow-up with Dr. Conley Canal, [facility], 09/16/18; [facility] of [facility] lesion [facility]/18 [facility] (no [facility]);  [facility] late December; 09/30/18 CT [facility]; went to [place] week after Christmas with OK from Dr. Edison Nasuti, returned home late March; PET today with [facility]; follow-up with Dr. Marcelyn Ditty [place] next Thursday, 4/23.  They indicated they will deliver discs with the 09/30/18 CT Neck and today's PET to [facility] lobby front desk by end of next week.  Dr. Maylon Peppers provided update.  Gayleen Orem, RN, BSN Head & Neck Oncology Nurse [place] at [place] [phone]

## 2019-01-23 ENCOUNTER — Other Ambulatory Visit (HOSPITAL_COMMUNITY): Payer: Self-pay | Admitting: Radiation Oncology

## 2019-01-23 ENCOUNTER — Telehealth: Payer: Self-pay | Admitting: *Deleted

## 2019-01-23 ENCOUNTER — Inpatient Hospital Stay
Admission: RE | Admit: 2019-01-23 | Discharge: 2019-01-23 | Disposition: A | Discharge status: Home / Self Care | Payer: Self-pay | Source: Ambulatory Visit | Attending: Radiation Oncology | Admitting: Radiation Oncology

## 2019-01-23 DIAGNOSIS — C801 Malignant (primary) neoplasm, unspecified: Secondary | ICD-10-CM

## 2019-01-23 NOTE — Telephone Encounter (Signed)
Oncology Nurse Navigator Documentation  In preparation for Mr. Sessums appt next week with Dr. Maylon Peppers, spoke with Lowell Bout[facility], requested faxing of Mr. [place] recent biopsy and imaging reports to Dr. Lorette Ang attention, [facility].  She voiced understanding/agreement.  Dr. Lorette Ang [facility] Office notified.  Gayleen Orem, RN, BSN Head & Neck Oncology Nurse [place] at [place] [phone]

## 2019-01-24 ENCOUNTER — Telehealth: Payer: Self-pay | Admitting: *Deleted

## 2019-01-24 NOTE — Telephone Encounter (Signed)
Thank you for the update. I agree with the plan.   Dr. Maylon Peppers

## 2019-01-24 NOTE — Telephone Encounter (Signed)
Oncology Nurse Navigator Documentation  Called Mr. Agostinelli re next week's appt with Dr. Maylon Peppers. I asked if he had had discussion with his [place] oncology team regarding the transition of medical oncology follow-up from [facility] to the [place] since completion of RT/chemo with Drs. Isidore Moos and [place] in May of last year. He indicated he received call today for 4/23 follow-up appt with Dr. Jimmye Norman, [facility]. He voiced agreement he did not think it was necessary to continue seeing Dr. Maylon Peppers, asked me to cancel next week's appt.  Dr. Maylon Peppers notified.  Gayleen Orem, RN, BSN Head & Neck Oncology Nurse [place] at [place] [phone]

## 2019-01-29 ENCOUNTER — Other Ambulatory Visit: Payer: Self-pay

## 2019-01-29 ENCOUNTER — Ambulatory Visit: Payer: Self-pay | Admitting: Hematology

## 2021-06-09 DIAGNOSIS — R55 Syncope and collapse: Secondary | ICD-10-CM | POA: Diagnosis not present

## 2021-06-10 DIAGNOSIS — I4892 Unspecified atrial flutter: Secondary | ICD-10-CM | POA: Diagnosis not present
